# Patient Record
Sex: Female | Born: 1995
Health system: Southern US, Community
[De-identification: ages and names within clinical notes are randomized; demographics above are authoritative.]

## PROBLEM LIST (undated history)

## (undated) DIAGNOSIS — I10 Essential (primary) hypertension: Secondary | ICD-10-CM

## (undated) DIAGNOSIS — F32A Depression, unspecified: Secondary | ICD-10-CM

## (undated) DIAGNOSIS — Z789 Other specified health status: Secondary | ICD-10-CM

## (undated) DIAGNOSIS — A599 Trichomoniasis, unspecified: Secondary | ICD-10-CM

## (undated) DIAGNOSIS — F329 Major depressive disorder, single episode, unspecified: Secondary | ICD-10-CM

## (undated) HISTORY — PX: WISDOM TOOTH EXTRACTION: SHX21

## (undated) HISTORY — DX: Essential (primary) hypertension: I10

## (undated) HISTORY — DX: Depression, unspecified: F32.A

## (undated) HISTORY — DX: Trichomoniasis, unspecified: A59.9

---
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
## 2015-10-20 ENCOUNTER — Emergency Department (HOSPITAL_COMMUNITY)
Admission: EM | Admit: 2015-10-20 | Discharge: 2015-10-21 | Disposition: A | Discharge status: Home / Self Care | Payer: BLUE CROSS/BLUE SHIELD | Attending: Emergency Medicine | Admitting: Emergency Medicine

## 2015-10-20 ENCOUNTER — Encounter (HOSPITAL_COMMUNITY): Payer: Self-pay | Admitting: *Deleted

## 2015-10-20 DIAGNOSIS — R21 Rash and other nonspecific skin eruption: Secondary | ICD-10-CM | POA: Diagnosis present

## 2015-10-20 DIAGNOSIS — Y9289 Other specified places as the place of occurrence of the external cause: Secondary | ICD-10-CM | POA: Diagnosis not present

## 2015-10-20 DIAGNOSIS — W57XXXA Bitten or stung by nonvenomous insect and other nonvenomous arthropods, initial encounter: Secondary | ICD-10-CM | POA: Insufficient documentation

## 2015-10-20 DIAGNOSIS — Y998 Other external cause status: Secondary | ICD-10-CM | POA: Insufficient documentation

## 2015-10-20 DIAGNOSIS — Y9389 Activity, other specified: Secondary | ICD-10-CM | POA: Insufficient documentation

## 2015-10-20 DIAGNOSIS — T148 Other injury of unspecified body region: Secondary | ICD-10-CM | POA: Insufficient documentation

## 2015-10-20 NOTE — ED Notes (Signed)
Pt states she has bug bit all over her body. Pt has a bug in bag she found on her leg.

## 2015-10-21 MED ORDER — TRIAMCINOLONE ACETONIDE 0.1 % EX CREA: 1.0000 "application " | TOPICAL_CREAM | Freq: Two times a day (BID) | CUTANEOUS | Status: DC

## 2015-10-21 NOTE — ED Notes (Signed)
Discharge instructions given, pt demonstrated teach back and verbal understanding. No concerns voiced.  

## 2015-10-21 NOTE — ED Provider Notes (Signed)
CSN: [id number]     Arrival date & time 10/20/15  2331 History   First MD Initiated Contact with Patient 10/20/15 2359     Chief Complaint  Patient presents with  . Insect Bite     (Consider location/radiation/quality/duration/timing/severity/associated sxs/prior Treatment) HPI Comments: Patient presents to the emergency department with a complaint of bites all over her body. She states that she noticed this earlier tonight. She found one of them crawling on her, and brought it with her to the emergency room. She denies any difficulty with swallowing, any difficulty with breathing, any swelling of face. The patient has no known allergies to insects. She has not had any changes in her diet, environment, or medications.  The history is provided by the patient.    History reviewed. No pertinent past medical history. History reviewed. No pertinent past surgical history. No family history on file. Social History  Substance Use Topics  . Smoking status: Never Smoker   . Smokeless tobacco: None  . Alcohol Use: No   OB History    No data available     Review of Systems  Skin: Positive for rash.  All other systems reviewed and are negative.     Allergies  Review of patient's allergies indicates no known allergies.  Home Medications   Prior to Admission medications   Not on File   BP 149/69 mmHg  Pulse 94  Temp(Src) 98.7 F (37.1 C) (Oral)  Resp 18  Ht 5\' 3"  (1.6 m)  Wt 190 lb (86.183 kg)  BMI 33.67 kg/m2  SpO2 100%  LMP 10/01/2015 Physical Exam  Constitutional: She is oriented to person, place, and time. She appears well-developed and well-nourished.  Non-toxic appearance.  HENT:  Head: Normocephalic.  Right Ear: Tympanic membrane and external ear normal.  Left Ear: Tympanic membrane and external ear normal.  Eyes: EOM and lids are normal. Pupils are equal, round, and reactive to light.  Neck: Normal range of motion. Neck supple. Carotid bruit is not present.   Cardiovascular: Normal rate, regular rhythm, normal heart sounds, intact distal pulses and normal pulses.   Pulmonary/Chest: Breath sounds normal. No respiratory distress.  Abdominal: Soft. Bowel sounds are normal. There is no tenderness. There is no guarding.  Musculoskeletal: Normal range of motion.  Lymphadenopathy:       Head (right side): No submandibular adenopathy present.       Head (left side): No submandibular adenopathy present.    She has no cervical adenopathy.  Neurological: She is alert and oriented to person, place, and time. She has normal strength. No cranial nerve deficit or sensory deficit.  Skin: Skin is warm and dry.  There are raised slightly red areas involving the chest, the extremities, and 2 on the leg. No red streaks appreciated.  Psychiatric: She has a normal mood and affect. Her speech is normal.  Nursing note and vitals reviewed.   ED Course  Procedures (including critical care time) Labs Review Labs Reviewed - No data to display  Imaging Review No results found. I have personally reviewed and evaluated these images and lab results as part of my medical decision-making.   EKG Interpretation None      MDM  the vital signs are within normal limits. The pattern of the bites, and the insect that the patient brought with her to the emergency room favor bedbugs. I've instructed the patient on sprained her mattress, Which, and shampooing her carpet daily on the next few days. Patient is given triamcinolone  cream to put on the bite areas. The patient is also using Benadryl for itching.    Final diagnoses:  None    **I have reviewed nursing notes, vital signs, and all appropriate lab and imaging results for this patient.Ivery Quale, PA-C 10/21/15 1610  Devoria Albe, MD 10/21/15 [phone]

## 2016-04-25 ENCOUNTER — Emergency Department (HOSPITAL_COMMUNITY)
Admission: EM | Admit: 2016-04-25 | Discharge: 2016-04-25 | Disposition: A | Discharge status: Home / Self Care | Payer: BLUE CROSS/BLUE SHIELD | Attending: Emergency Medicine | Admitting: Emergency Medicine

## 2016-04-25 ENCOUNTER — Encounter (HOSPITAL_COMMUNITY): Payer: Self-pay | Admitting: Emergency Medicine

## 2016-04-25 DIAGNOSIS — J0110 Acute frontal sinusitis, unspecified: Secondary | ICD-10-CM

## 2016-04-25 DIAGNOSIS — R51 Headache: Secondary | ICD-10-CM

## 2016-04-25 DIAGNOSIS — R519 Headache, unspecified: Secondary | ICD-10-CM

## 2016-04-25 MED ORDER — BUTALBITAL-APAP-CAFF-COD 50-325-40-30 MG PO CAPS: ORAL_CAPSULE | ORAL | Status: DC

## 2016-04-25 MED ORDER — DEXAMETHASONE 4 MG PO TABS: 4.0000 mg | ORAL_TABLET | Freq: Two times a day (BID) | ORAL | Status: DC

## 2016-04-25 MED ORDER — OXYMETAZOLINE HCL 0.05 % NA SOLN
1.0000 | Freq: Once | NASAL | Status: AC
  Administered 2016-04-25: 1 via NASAL
  Filled 2016-04-25: qty 15

## 2016-04-25 MED ORDER — IBUPROFEN 800 MG PO TABS
800.0000 mg | ORAL_TABLET | Freq: Once | ORAL | Status: AC
Start: 2016-04-25 — End: 2016-04-25
  Administered 2016-04-25: 800 mg via ORAL
  Filled 2016-04-25: qty 1

## 2016-04-25 MED ORDER — LORATADINE-PSEUDOEPHEDRINE ER 5-120 MG PO TB12: 1.0000 | ORAL_TABLET | Freq: Two times a day (BID) | ORAL | Status: DC

## 2016-04-25 MED ORDER — DEXAMETHASONE SODIUM PHOSPHATE 4 MG/ML IJ SOLN
8.0000 mg | Freq: Once | INTRAMUSCULAR | Status: AC
  Administered 2016-04-25: 8 mg via INTRAMUSCULAR
  Filled 2016-04-25: qty 2

## 2016-04-25 NOTE — ED Notes (Addendum)
PA at bedside.

## 2016-04-25 NOTE — ED Provider Notes (Signed)
CSN: [id number]     Arrival date & time 04/25/16  1711 History   First MD Initiated Contact with Patient 04/25/16 1721     Chief Complaint  Patient presents with  . Headache     (Consider location/radiation/quality/duration/timing/severity/associated sxs/prior Treatment) HPI Comments: Patient is a 20 year old female who presents to the emergency department with a complaint of left-sided headache.  The patient states she's been having problems with cough and congestion over the last 2 or 3 nights. On last night, she states her headache began and cause difficulty with her concentrating on her job. She did not have any loss of consciousness, but states she is sensitive to light and sound at times. She has not had any double vision on. She's not had any loss of control of upper or lower extremities, or bowel or bladder function. He's not had any previous injury to the head. She has taken Tylenol and some over-the-counter medications without improvement.  The history is provided by the patient.    History reviewed. No pertinent past medical history. History reviewed. No pertinent past surgical history. History reviewed. No pertinent family history. Social History  Substance Use Topics  . Smoking status: Never Smoker   . Smokeless tobacco: None  . Alcohol Use: No   OB History    No data available     Review of Systems  HENT: Positive for congestion, postnasal drip and sinus pressure. Negative for facial swelling, hearing loss, mouth sores, nosebleeds, sore throat and trouble swallowing.   Eyes: Positive for photophobia.  Neurological: Positive for headaches.      Allergies  Review of patient's allergies indicates no known allergies.  Home Medications   Prior to Admission medications   Medication Sig Start Date End Date Taking? Authorizing Provider  triamcinolone cream (KENALOG) 0.1 % Apply 1 application topically 2 (two) times daily. 10/21/15   Ivery Quale, PA-C   BP 147/77  mmHg  Pulse 98  Temp(Src) 99 F (37.2 C) (Oral)  Resp 18  Ht  (1.626 m)  Wt 96.871 kg  BMI 36.64 kg/m2  SpO2 100%  LMP 04/15/2016 Physical Exam  Constitutional: She is oriented to person, place, and time. She appears well-developed and well-nourished.  Non-toxic appearance.  HENT:  Head: Normocephalic.  Right Ear: Tympanic membrane and external ear normal.  Left Ear: Tympanic membrane and external ear normal.  Nasal congestion present. Pain to percussion over the frontal sinuses on the left. There is mild increased redness of the posterior pharynx. The uvula is swollen. The airway is patent.  Eyes: EOM and lids are normal. Pupils are equal, round, and reactive to light.  Neck: Normal range of motion. Neck supple. Carotid bruit is not present.  Cardiovascular: Normal rate, regular rhythm, normal heart sounds, intact distal pulses and normal pulses.   Pulmonary/Chest: Breath sounds normal. No respiratory distress.  Abdominal: Soft. Bowel sounds are normal. There is no tenderness. There is no guarding.  Musculoskeletal: Normal range of motion.  Lymphadenopathy:       Head (right side): No submandibular adenopathy present.       Head (left side): No submandibular adenopathy present.    She has no cervical adenopathy.  Neurological: She is alert and oriented to person, place, and time. She has normal strength. No cranial nerve deficit or sensory deficit.  Skin: Skin is warm and dry.  Psychiatric: She has a normal mood and affect. Her speech is normal.  Nursing note and vitals reviewed.   ED Course  Procedures (including critical care time) Labs Review Labs Reviewed - No data to display  Imaging Review No results found. I have personally reviewed and evaluated these images and lab results as part of my medical decision-making.   EKG Interpretation None      MDM  Vital signs are within normal limits. There is no gross neurologic deficit appreciated on examination. There  is pain to percussion of the frontal sinuses, and nasal congestion present. Suspect the patient has headache related to sinus congestion and upper respiratory infection.  The patient will be treated with Decadron, Fioricet/codeine, and Claritin-D. The patient is to follow-up with her primary physician if not improving.    Final diagnoses:  Acute frontal sinusitis, recurrence not specified  Headache, unspecified headache type    *I have reviewed nursing notes, vital signs, and all appropriate lab and imaging results for this patient.[address]    Yancey Pedley, PA-C 04/25/16 1746  Donnetta HutchingBrian Cook, MD 04/29/16 [phone]

## 2016-04-25 NOTE — Discharge Instructions (Signed)
Sinus Headache A sinus headache occurs when the paranasal sinuses become clogged or swollen. Paranasal sinuses are air pockets within the bones of the face. Sinus headaches can range from mild to severe. CAUSES A sinus headache can result from various conditions that affect the sinuses, such as:  Colds.  Sinus infections.  Allergies. SYMPTOMS The main symptom of this condition is a headache that may feel like pain or pressure in the face, forehead, ears, or upper teeth. People who have a sinus headache often have other symptoms, such as:  Congested or runny nose.  Fever.  Inability to smell. Weather changes can make symptoms worse. DIAGNOSIS This condition may be diagnosed based on:  A physical exam and medical history.  Imaging tests, such as a CT scan and MRI, to check for problems with the sinuses.  A specialist may look into the sinuses with a tool that has a camera (endoscopy). TREATMENT Treatment for this condition depends on the cause.  Sinus pain that is caused by a sinus infection may be treated with antibiotic medicine.  Sinus pain that is caused by allergies may be helped by allergy medicines (antihistamines) and medicated nasal sprays.  Sinus pain that is caused by congestion may be helped by flushing the nose and sinuses with saline solution. HOME CARE INSTRUCTIONS  Take medicines only as directed by your health care provider.  If you were prescribed an antibiotic medicine, finish all of it even if you start to feel better.  If you have congestion, use a nasal spray to help reduce pressure.  If directed, apply a warm, moist washcloth to your face to help relieve pain. SEEK MEDICAL CARE IF:  You have headaches more than one time each week.  You have sensitivity to light or sound.  You have a fever.  You feel sick to your stomach (nauseous) or you throw up (vomit).  Your headaches do not get better with treatment. Many people think that they have a  sinus headache when they actually have migraines or tension headaches. SEEK IMMEDIATE MEDICAL CARE IF:  You have vision problems.  You have sudden, severe pain in your face or head.  You have a seizure.  You are confused.  You have a stiff neck.   This information is not intended to replace advice given to you by your health care provider. Make sure you discuss any questions you have with your health care provider.   Document Released: 01/15/2005 Document Revised: 04/24/2015 Document Reviewed: 12/04/2014 Elsevier Interactive Patient Education 2016 [employer]  

## 2016-04-25 NOTE — ED Notes (Signed)
Pt reports headache and nasal congestion since last night. Pt reports light and sound sensitivity. nad noted.

## 2017-02-03 ENCOUNTER — Emergency Department (HOSPITAL_COMMUNITY): Payer: BLUE CROSS/BLUE SHIELD

## 2017-02-03 ENCOUNTER — Encounter (HOSPITAL_COMMUNITY): Payer: Self-pay | Admitting: Emergency Medicine

## 2017-02-03 ENCOUNTER — Emergency Department (HOSPITAL_COMMUNITY)
Admission: EM | Admit: 2017-02-03 | Discharge: 2017-02-03 | Disposition: A | Discharge status: Home / Self Care | Payer: BLUE CROSS/BLUE SHIELD | Attending: Emergency Medicine | Admitting: Emergency Medicine

## 2017-02-03 DIAGNOSIS — H7092 Unspecified mastoiditis, left ear: Secondary | ICD-10-CM

## 2017-02-03 DIAGNOSIS — J029 Acute pharyngitis, unspecified: Secondary | ICD-10-CM | POA: Diagnosis present

## 2017-02-03 DIAGNOSIS — H66003 Acute suppurative otitis media without spontaneous rupture of ear drum, bilateral: Secondary | ICD-10-CM

## 2017-02-03 LAB — CBC WITH DIFFERENTIAL/PLATELET
BASOS ABS: 0 10*3/uL (ref 0.0–0.1)
BASOS PCT: 0 %
EOS ABS: 0 10*3/uL (ref 0.0–0.7)
EOS PCT: 0 %
HCT: 36.7 % (ref 36.0–46.0)
HEMOGLOBIN: 12.3 g/dL (ref 12.0–15.0)
Lymphocytes Relative: 21 %
Lymphs Abs: 2.6 10*3/uL (ref 0.7–4.0)
MCH: 29.5 pg (ref 26.0–34.0)
MCHC: 33.5 g/dL (ref 30.0–36.0)
MCV: 88 fL (ref 78.0–100.0)
MONO ABS: 0.7 10*3/uL (ref 0.1–1.0)
MONOS PCT: 5 %
NEUTROS ABS: 9.7 10*3/uL — AB (ref 1.7–7.7)
Neutrophils Relative %: 74 %
PLATELETS: 294 10*3/uL (ref 150–400)
RBC: 4.17 MIL/uL (ref 3.87–5.11)
RDW: 12.6 % (ref 11.5–15.5)
WBC: 13 10*3/uL — ABNORMAL HIGH (ref 4.0–10.5)

## 2017-02-03 LAB — BASIC METABOLIC PANEL
Anion gap: 7 (ref 5–15)
BUN: 7 mg/dL (ref 6–20)
CALCIUM: 8.9 mg/dL (ref 8.9–10.3)
CHLORIDE: 107 mmol/L (ref 101–111)
CO2: 25 mmol/L (ref 22–32)
CREATININE: 0.52 mg/dL (ref 0.44–1.00)
Glucose, Bld: 119 mg/dL — ABNORMAL HIGH (ref 65–99)
Potassium: 3.7 mmol/L (ref 3.5–5.1)
SODIUM: 139 mmol/L (ref 135–145)

## 2017-02-03 MED ORDER — IOPAMIDOL (ISOVUE-300) INJECTION 61%
75.0000 mL | Freq: Once | INTRAVENOUS | Status: AC | PRN
  Administered 2017-02-03: 75 mL via INTRAVENOUS

## 2017-02-03 MED ORDER — OXYCODONE-ACETAMINOPHEN 5-325 MG PO TABS
2.0000 | ORAL_TABLET | Freq: Once | ORAL | Status: AC
  Administered 2017-02-03: 2 via ORAL
  Filled 2017-02-03: qty 2

## 2017-02-03 MED ORDER — OXYCODONE-ACETAMINOPHEN 5-325 MG PO TABS: 2.0000 | ORAL_TABLET | ORAL | 0 refills | Status: DC | PRN

## 2017-02-03 MED ORDER — ACETAMINOPHEN 325 MG PO TABS
650.0000 mg | ORAL_TABLET | Freq: Once | ORAL | Status: AC
  Administered 2017-02-03: 650 mg via ORAL
  Filled 2017-02-03: qty 2

## 2017-02-03 MED ORDER — AMOXICILLIN-POT CLAVULANATE 875-125 MG PO TABS: 1.0000 | ORAL_TABLET | Freq: Two times a day (BID) | ORAL | 0 refills | Status: AC

## 2017-02-03 MED ORDER — SODIUM CHLORIDE 0.9 % IV SOLN
3.0000 g | Freq: Once | INTRAVENOUS | Status: AC
  Administered 2017-02-03: 3 g via INTRAVENOUS
  Filled 2017-02-03: qty 3

## 2017-02-03 NOTE — ED Triage Notes (Signed)
Pt reports left ear pain x2 days with fever.  Pt alert and oriented.

## 2017-02-03 NOTE — Discharge Instructions (Signed)
The CT scan showed bilateral middle ear infections worst on the left side and mastoiditis on the left side. Mastoiditis is an infection of the mastoid cavity which is located behind your ear.  You were given IV antibiotics in the emergency department to help with the infection.   Please take the antibiotic prescribed (Augmentin) twice a day for the next 10 days, make sure you do not miss a dose or stop before completing the entire course. You have been prescribed Percocet for severe pain. Please take ibuprofen 600 mg every 8 hours for inflammation and additional pain control. Please follow-up with ear, nose, throat doctor (Dr. Annalee GentaShoemaker) in 2 weeks for reevaluation and to ensure that your ear infections and mastoiditis have resolved.  Your ear pain and fever should start to decrease in the next 24-48 hours, please return to the emergency department immediately if your symptoms are worsening.

## 2017-02-03 NOTE — ED Provider Notes (Signed)
[facility]-EMERGENCY DEPT Provider Note   CSN: [id number] Arrival date & time: 02/03/17  1721     History   Chief Complaint Chief Complaint  Patient presents with  . Otalgia    HPI Alicia Adkins is a 21 y.o. female with no pertinent past medical history presents with nasal congestion, mild sore throat, cough and left-sided ear pain 3 days. Patient developed fever this morning ~51F. Patient denies tinnitus, ear fullness, decreased hearing, dizziness, body aches. No abdominal pain, n/v.   HPI  History reviewed. No pertinent past medical history.  There are no active problems to display for this patient.   History reviewed. No pertinent surgical history.  OB History    No data available       Home Medications    Prior to Admission medications   Medication Sig Start Date End Date Taking? Authorizing Provider  amoxicillin-clavulanate (AUGMENTIN) 875-125 MG tablet Take 1 tablet by mouth every 12 (twelve) hours. 02/03/17 02/13/17  Liberty Handy, PA-C  butalbital-acetaminophen-caffeine (FIORICET/CODEINE) [phone] MG capsule 1 or 2 po q6h prn headache 04/25/16   Ivery Quale, PA-C  dexamethasone (DECADRON) 4 MG tablet Take 1 tablet (4 mg total) by mouth 2 (two) times daily with a meal. 04/25/16   Ivery Quale, PA-C  loratadine-pseudoephedrine (CLARITIN-D 12 HOUR) 5-120 MG tablet Take 1 tablet by mouth 2 (two) times daily. 04/25/16   Ivery Quale, PA-C  oxyCODONE-acetaminophen (PERCOCET/ROXICET) 5-325 MG tablet Take 2 tablets by mouth every 4 (four) hours as needed for severe pain. 02/03/17   Liberty Handy, PA-C  triamcinolone cream (KENALOG) 0.1 % Apply 1 application topically 2 (two) times daily. 10/21/15   Ivery Quale, PA-C    Family History History reviewed. No pertinent family history.  Social History Social History  Substance Use Topics  . Smoking status: Never Smoker  . Smokeless tobacco: Not on file  . Alcohol use No     Allergies   Patient has no  known allergies.   Review of Systems Review of Systems  Constitutional: Positive for fever.  HENT: Positive for congestion, ear pain and sore throat. Negative for ear discharge, hearing loss and postnasal drip.   Eyes: Negative for visual disturbance.  Respiratory: Positive for cough. Negative for shortness of breath.   Cardiovascular: Negative for chest pain.  Gastrointestinal: Negative for abdominal pain, diarrhea, nausea and vomiting.  Genitourinary: Negative for difficulty urinating.  Musculoskeletal: Negative for arthralgias.  Neurological: Negative for dizziness, weakness, light-headedness and headaches.     Physical Exam Updated Vital Signs BP 134/96 (BP Location: Left Arm)   Pulse 107   Temp 100.8 F (38.2 C) (Oral)   Resp 20   Ht 5\' 4"  (1.626 m)   Wt 97.5 kg   LMP 01/31/2017 (Exact Date)   SpO2 100%   BMI 36.90 kg/m   Physical Exam  Constitutional: She is oriented to person, place, and time. She appears well-developed and well-nourished. No distress.  NAD.  HENT:  Head: Normocephalic and atraumatic.  Right Ear: External ear normal.  Left Ear: External ear normal.  Nose: Nose normal.  Mouth/Throat: Oropharynx is clear and moist. No oropharyngeal exudate.  Head: No lesions on scalp. Skull and facial bones symmetric, non-tender without bony abnormalities. Frontal and maxillary sinuses are non-tender to percussion. Eyes: Lids symmetrical without lag or palpable mass. Sclera white without prominent vessels. Conjunctiva pink. PERRL bilaterally.   Ears: Both external ears withut lesions, swelling, deformities or tenderness with manipulation.  RIGHT external ear auditory canal clear  with mild edema and erythema.  LEFT external ear auditory canal edematous and erythematous.  RIGHT TM pearly gray with visible cone of light and bony landmarks bilaterally.  LEFT TM bulging, erythematous and cloudy without visible cone of light or bony landmarks bilaterally. +left mastoid  tenderness. No right mastoid tenderness.  Nose: Nares patent without discharge. Nasal mucosa erythematous and mildly edematous.  No sinus tenderness. Septum midline.  Throat: Lips are pink and symmetrical. Dentition normal.  Gingiva, labial and buccal mucosa pink without lesions, tenderness or fluctuance. Oropharynx and tonsils moist with mild erythema, without edema or exudates. Uvula midline. No trismus.   Eyes: Conjunctivae and EOM are normal. Pupils are equal, round, and reactive to light. No scleral icterus.  Neck: Normal range of motion. Neck supple. No JVD present.  Cardiovascular: Normal rate, regular rhythm and normal heart sounds.   No murmur heard. Pulmonary/Chest: Effort normal and breath sounds normal. She has no wheezes.  Abdominal: Soft. There is no tenderness.  Musculoskeletal: Normal range of motion. She exhibits no deformity.  Lymphadenopathy:    She has no cervical adenopathy.  Neurological: She is alert and oriented to person, place, and time.  CN VIII hearing intact to finger rub   Skin: Skin is warm and dry. Capillary refill takes less than 2 seconds.  Psychiatric: She has a normal mood and affect. Her behavior is normal. Judgment and thought content normal.  Nursing note and vitals reviewed.    ED Treatments / Results  Labs (all labs ordered are listed, but only abnormal results are displayed) Labs Reviewed  CBC WITH DIFFERENTIAL/PLATELET - Abnormal; Notable for the following:       Result Value   WBC 13.0 (*)    Neutro Abs 9.7 (*)    All other components within normal limits  BASIC METABOLIC PANEL - Abnormal; Notable for the following:    Glucose, Bld 119 (*)    All other components within normal limits    EKG  EKG Interpretation None       Radiology Ct Temporal Bones W Contrast  Result Date: 02/03/2017 CLINICAL DATA:  Left ear pain for 2 days with fever EXAM: CT TEMPORAL BONES WITH CONTRAST TECHNIQUE: Axial and coronal plane CT imaging of the  petrous temporal bones was performed with thin-collimation image reconstruction after intravenous contrast administration. Multiplanar CT image reconstructions were also generated. CONTRAST:  75mL ISOVUE-300 IOPAMIDOL (ISOVUE-300) INJECTION 61% COMPARISON:  None. FINDINGS: RIGHT: --Pinna and external auditory canal: Normal. --Ossicular chain: The ossicles are encased by low attenuation material. No ossicular erosion or dislocation. --Tympanic membrane: Normal. --Middle ear: There is low attenuation material filling much of the middle ear. --Epitympanum: Low attenuation material extends from the middle ear into the epitympanum. There is no blunting of the scutum. The tegmen tympani remains intact. --Cochlea, vestibule, vestibular aqueduct and semicircular canals: Normal. No evidence of canal dehiscence or otospongiosis. --Internal auditory canal: Normal. No widening of the porus acusticus. --Facial nerve: No focal abnormality along the course of the facial nerve. --Cerebellopontine angle: Normal. --Petrous Apex: Normal. --Mastoids: Normal. --Carotid canal: Normal position. LEFT: --Pinna and external auditory canal: Normal. --Ossicular chain: The ossicles are encased and low attenuation material. No ossicular erosion. --Tympanic membrane: Not clearly visualized. --Middle ear: The middle ear is largely opacified. --Epitympanum: Low attenuation material opacifying the middle ear extends into the epitympanum. Possible mild blunting of the scutum. The tegmen tympani remains intact. --Cochlea, vestibule, vestibular aqueduct and semicircular canals: Normal. No evidence of canal dehiscence or otospongiosis. --  Internal auditory canal: Normal. No widening of the porus acusticus. --Facial nerve: No focal abnormality along the course of the facial nerve. --Cerebellopontine angle: Normal. --Petrous Apex: Normal. --Mastoids: Partial opacification of the mastoid air cells without coalescence. --Carotid canal: Normal position.  OTHER: --Visualized intracranial: Normal. --Visualized paranasal sinuses: There is partial opacification of the ethmoid air cells, bilateral sphenoid and bilateral maxillary sinuses. There is fluid layering within the sphenoid sinuses and the left maxillary sinus. --Nasopharynx: Clear. --Temporomandibular joints: Normal. --Visualized extracranial soft tissues: Normal. IMPRESSION: 1. Left middle ear opacification and partial opacification of the left mastoid air cells without coalescence. In the provided clinical context, this is in keeping with otomastoiditis. 2. Partial opacification of the right middle ear extending into the epitympanum without ossicular erosion. This may indicate otitis media, but they cholesteatoma may have a similar appearance. Electronically Signed   By: Deatra RobinsonKevin  Herman M.D.   On: 02/03/2017 20:41    Procedures Procedures (including critical care time)  Medications Ordered in ED Medications  iopamidol (ISOVUE-300) 61 % injection 75 mL (75 mLs Intravenous Contrast Given 02/03/17 1958)  acetaminophen (TYLENOL) tablet 650 mg (650 mg Oral Given 02/03/17 2104)  Ampicillin-Sulbactam (UNASYN) 3 g in sodium chloride 0.9 % 100 mL IVPB (0 g Intravenous Stopped 02/03/17 2211)  oxyCODONE-acetaminophen (PERCOCET/ROXICET) 5-325 MG per tablet 2 tablet (2 tablets Oral Given 02/03/17 2128)     Initial Impression / Assessment and Plan / ED Course  I have reviewed the triage vital signs and the nursing notes.  Pertinent labs & imaging results that were available during my care of the patient were reviewed by me and considered in my medical decision making (see chart for details).  Clinical Course as of Feb 03 2219  Tue Feb 03, 2017  2045 IMPRESSION: 1. Left middle ear opacification and partial opacification of the left mastoid air cells without coalescence. In the provided clinical context, this is in keeping with otomastoiditis. 2. Partial opacification of the right middle ear extending into  the epitympanum without ossicular erosion. This may indicate otitis media, but they cholesteatoma may have a similar appearance.   [CG]  2046 Leukocytosis WBC: (!) 13.0 [CG]  2046 Borderline febrile Temp: 99.9 F (37.7 C) [CG]  2111 Spoke to ENT (Dr. Annalee GentaShoemaker) who recommend unasyn 3g in ED and discharge with augmentin and pain control and follow up in clinic in 2 weeks for ear check  [CG]  2137 Febrile, will give tylenol  Temp: 100.8 F (38.2 C) [CG]    Clinical Course User Index [CG] Liberty Handylaudia J Vessie Olmsted, PA-C   21 year old female with no pertinent past medical history presents with upper respiratory infection symptoms and left ear pain 3 days. On exam vital signs are remarkable for low-grade fever at 100.8. There is moderate left middle ear canal erythema and edema, tympanic membrane is bulging, erythematous and cloudy without vesicles. Left mastoid tenderness without edema or surrounding erythema. Right middle ear canal is only mildly erythematous with normal tympanic membrane and no mastoid tenderness. Moderate nasal mucosa edema and clear discharge. Lungs clear to auscultation bilaterally. Concerned for bilateral otitis media and mastoid tenderness suggests possible mastoiditis. CT scan revealed left middle ear opacification and partial opaficiation of left mastoid air cells suggestive of left mastoiditis.  CT scan also showed partial opaficification of right middle ear suggestive of right otitis media. Leukocytosis at 13. ENT consulted, spoke to Dr. Annalee GentaShoemaker, who recommended Unasyn in the ED and discharge with Augmentin 10 days, pain control and clinic follow-up in  2 weeks for reevaluation. Antibiotics given in the ED. Strict ED return precautions given. Patient is aware of red flag symptoms to monitor for that would warrant return to ED for re-evaluation, patient verbalized understanding and plans on making an appointment with Dr. Annalee Genta in 2 weeks. Patient discussed with Dr. Eudelia Bunch who  assisted in MDM.   Final Clinical Impressions(s) / ED Diagnoses   Final diagnoses:  Acute suppurative otitis media of both ears without spontaneous rupture of tympanic membranes, recurrence not specified  Mastoiditis of left side    New Prescriptions New Prescriptions   AMOXICILLIN-CLAVULANATE (AUGMENTIN) 875-125 MG TABLET    Take 1 tablet by mouth every 12 (twelve) hours.   OXYCODONE-ACETAMINOPHEN (PERCOCET/ROXICET) 5-325 MG TABLET    Take 2 tablets by mouth every 4 (four) hours as needed for severe pain.     Liberty Handy, PA-C 02/03/17 2220    Nira Conn, MD 02/04/17 [phone]

## 2017-03-29 IMAGING — CT CT TEMPORAL BONES W/ CM
2 of 4 series · 12 of 40 positions shown, 15 images · IV contrast (iopamidol)
Comparison: None.

CLINICAL DATA: Left ear pain for 2 days with fever

EXAM:
CT TEMPORAL BONES WITH CONTRAST
TECHNIQUE: Axial and coronal plane CT imaging of the petrous temporal bones was
performed with thin-collimation image reconstruction after
intravenous contrast administration. Multiplanar CT image
reconstructions were also generated.
CONTRAST:  75mL XNGN47-DQQ IOPAMIDOL (XNGN47-DQQ) INJECTION 61%

[Series 4: coronal bone. · coronal · 0.23mm/px · 3 of 175 slices shown]
[im 44/175  bone]
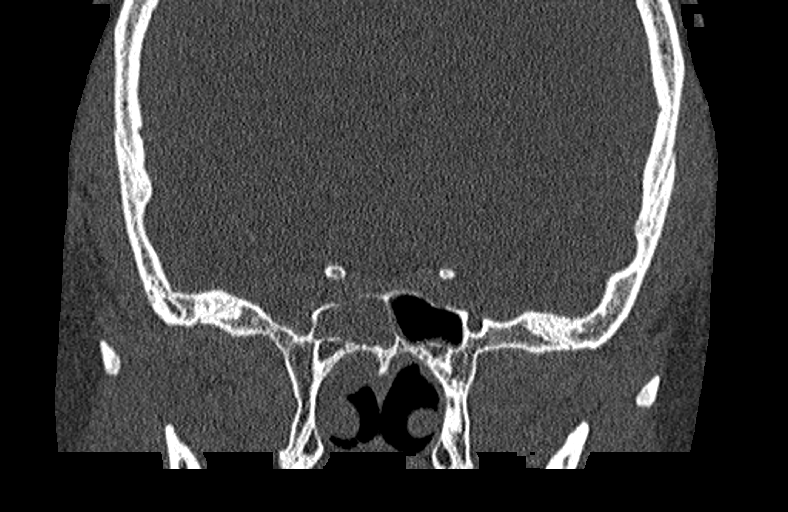
[im 88/175  bone]
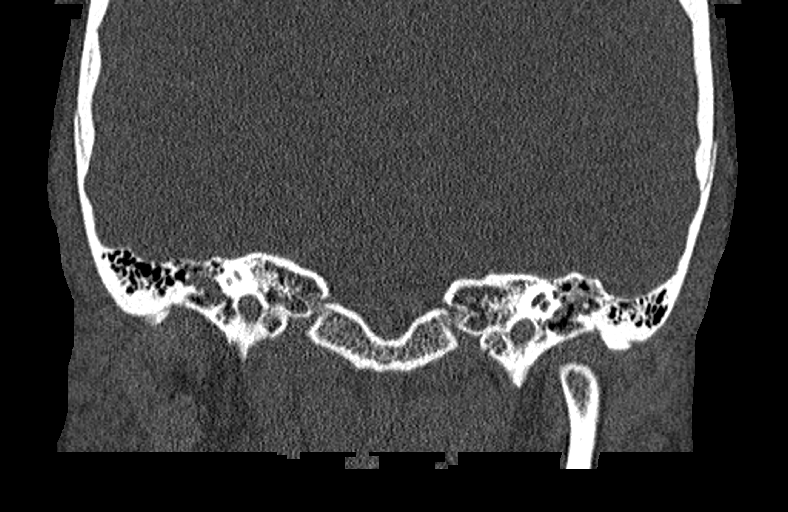
[im 131/175  bone]
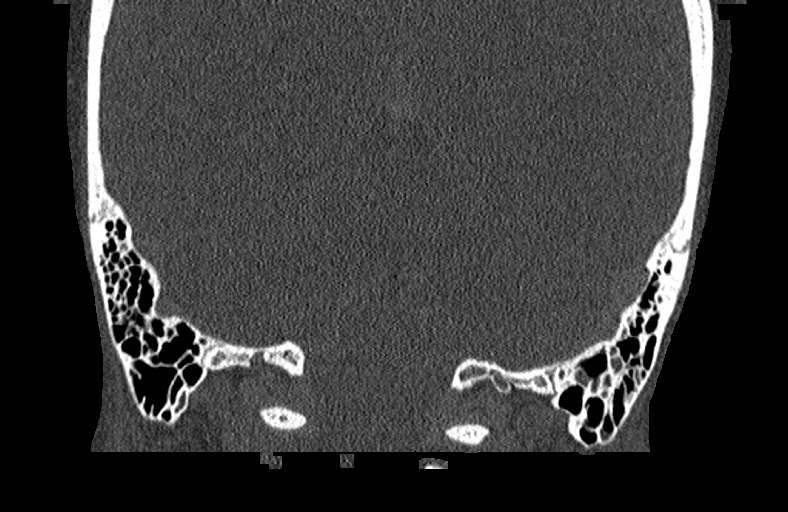

[Series 7: ax mag left · axial · 0.20mm/px · z∈[+1673,+1735]mm · 9 of 130 slices shown, 12 images]
[im 13/130  brain]
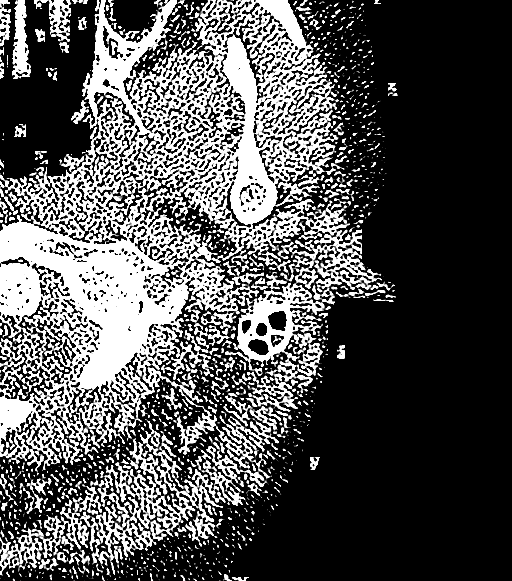
[im 13/130  bone]
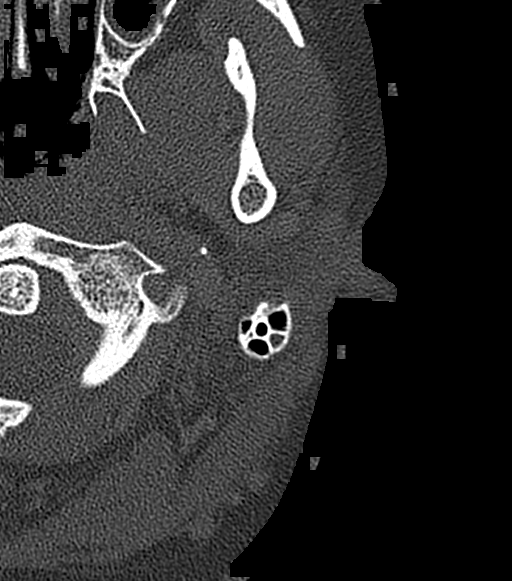
[im 26/130  bone]
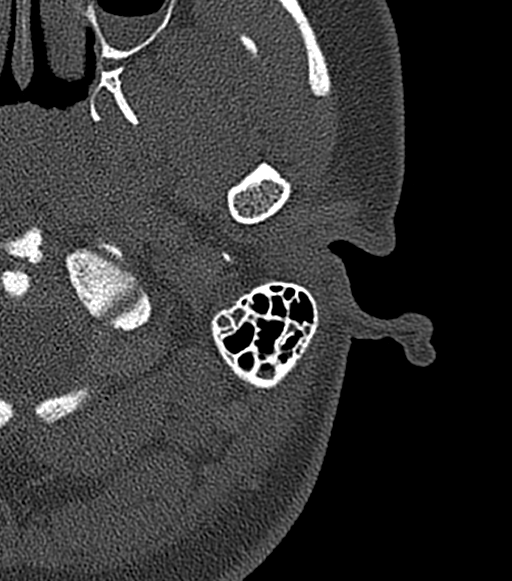
[im 39/130  bone]
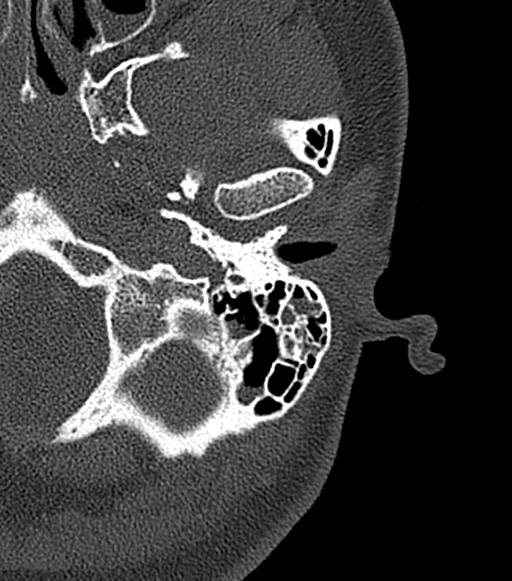
[im 52/130  bone]
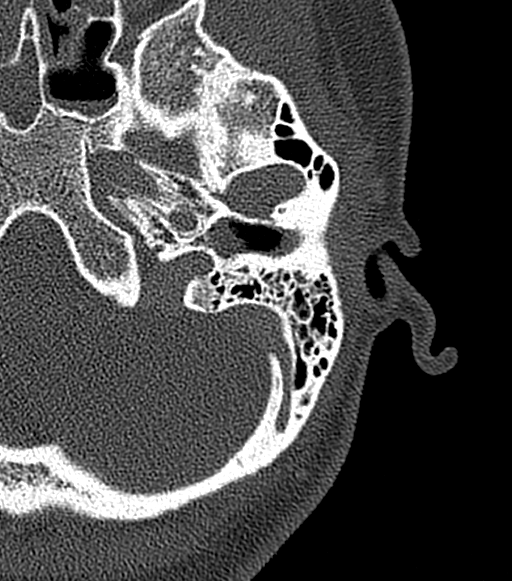
[im 65/130  brain]
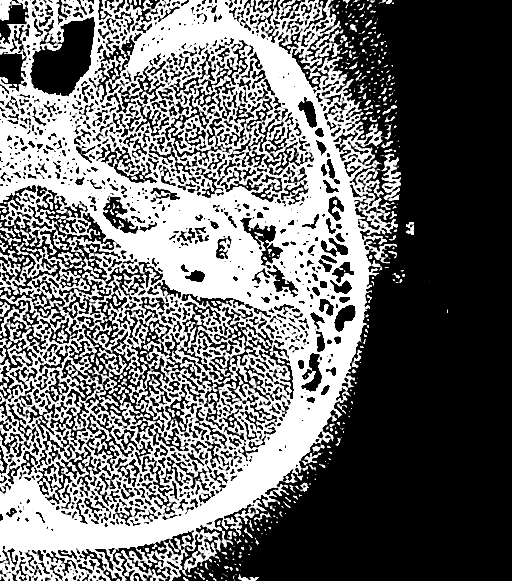
[im 65/130  bone]
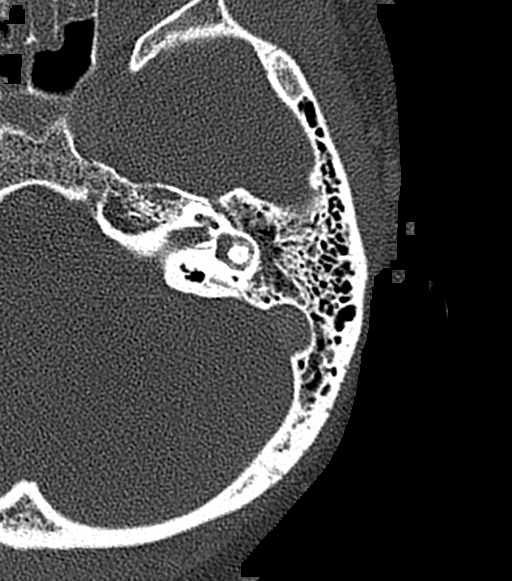
[im 78/130  bone]
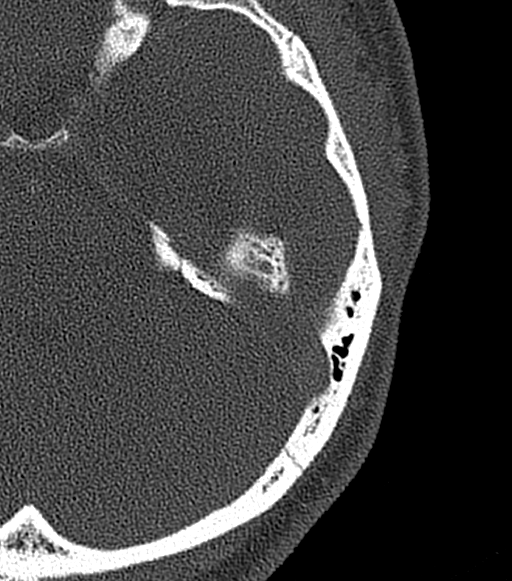
[im 91/130  bone]
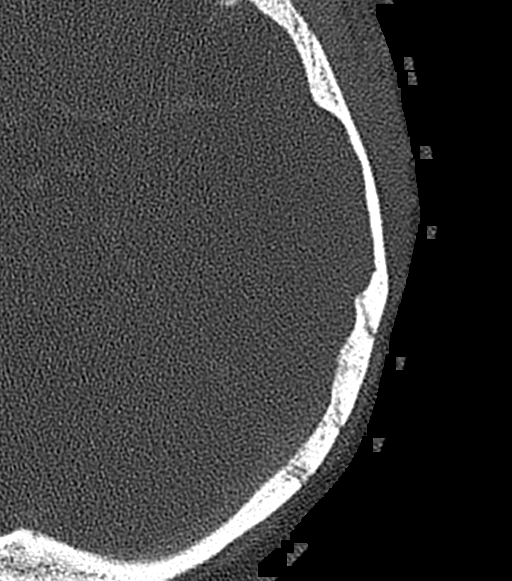
[im 104/130  bone]
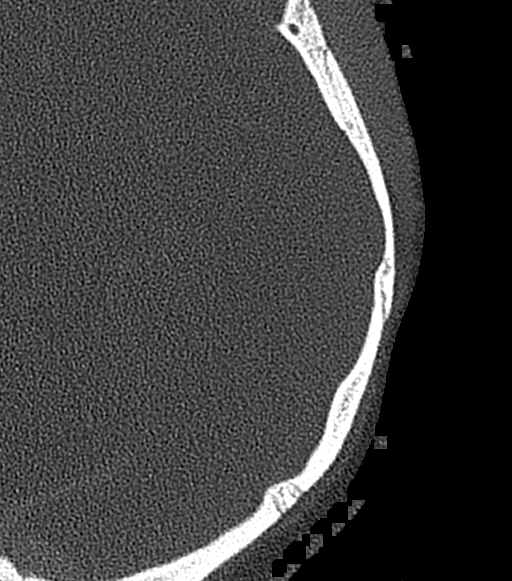
[im 117/130  brain]
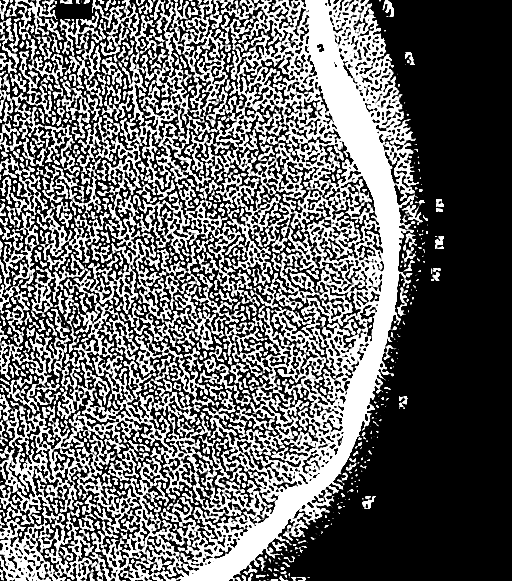
[im 117/130  bone]
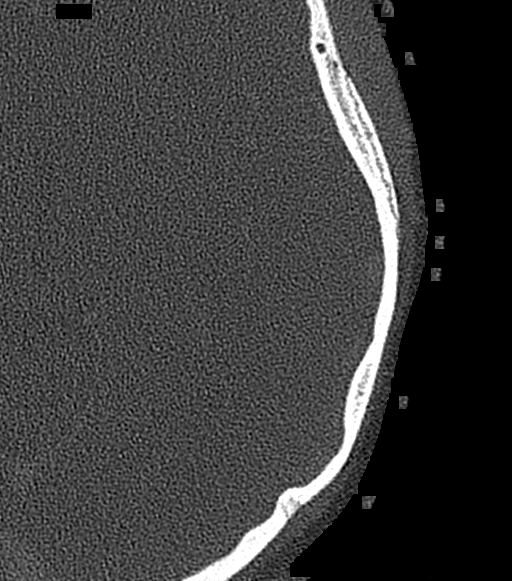

[12 of 40 positions shown; findings below may reference images not displayed]

FINDINGS: RIGHT:

--Pinna and external auditory canal: Normal.

--Ossicular chain: The ossicles are encased by low attenuation
material. No ossicular erosion or dislocation.

--Tympanic membrane: Normal.

--Middle ear: There is low attenuation material filling much of the
middle ear.

--Epitympanum: Low attenuation material extends from the middle ear
into the epitympanum. There is no blunting of the scutum. The tegmen
tympani remains intact.

--Cochlea, vestibule, vestibular aqueduct and semicircular canals:
Normal. No evidence of canal dehiscence or otospongiosis.

--Internal auditory canal: Normal. No widening of the porus
acusticus.

--Facial nerve: No focal abnormality along the course of the facial
nerve.

--Cerebellopontine angle: Normal.

--Petrous Apex: Normal.

--Mastoids: Normal.

--Carotid canal: Normal position.

LEFT:

--Pinna and external auditory canal: Normal.

--Ossicular chain: The ossicles are encased and low attenuation
material. No ossicular erosion.

--Tympanic membrane: Not clearly visualized.

--Middle ear: The middle ear is largely opacified.

--Epitympanum: Low attenuation material opacifying the middle ear
extends into the epitympanum. Possible mild blunting of the scutum.
The tegmen tympani remains intact.

--Cochlea, vestibule, vestibular aqueduct and semicircular canals:
Normal. No evidence of canal dehiscence or otospongiosis.

--Internal auditory canal: Normal. No widening of the porus
acusticus.

--Facial nerve: No focal abnormality along the course of the facial
nerve.

--Cerebellopontine angle: Normal.

--Petrous Apex: Normal.

--Mastoids: Partial opacification of the mastoid air cells without
coalescence.

--Carotid canal: Normal position.

OTHER:

--Visualized intracranial: Normal.

--Visualized paranasal sinuses: There is partial opacification of
the ethmoid air cells, bilateral sphenoid and bilateral maxillary
sinuses. There is fluid layering within the sphenoid sinuses and the
left maxillary sinus.

--Nasopharynx: Clear.

--Temporomandibular joints: Normal.

--Visualized extracranial soft tissues: Normal.
IMPRESSION: 1. Left middle ear opacification and partial opacification of the
left mastoid air cells without coalescence. In the provided clinical
context, this is in keeping with otomastoiditis.
2. Partial opacification of the right middle ear extending into the
epitympanum without ossicular erosion. This may indicate otitis
media, but they cholesteatoma may have a similar appearance.

## 2018-06-09 ENCOUNTER — Encounter (HOSPITAL_COMMUNITY): Payer: Self-pay | Admitting: Emergency Medicine

## 2018-06-09 ENCOUNTER — Other Ambulatory Visit: Payer: Self-pay

## 2018-06-09 ENCOUNTER — Emergency Department (HOSPITAL_COMMUNITY)
Admission: EM | Admit: 2018-06-09 | Discharge: 2018-06-09 | Disposition: A | Discharge status: Home / Self Care | Payer: BLUE CROSS/BLUE SHIELD | Attending: Emergency Medicine | Admitting: Emergency Medicine

## 2018-06-09 DIAGNOSIS — J029 Acute pharyngitis, unspecified: Secondary | ICD-10-CM | POA: Diagnosis present

## 2018-06-09 DIAGNOSIS — J020 Streptococcal pharyngitis: Secondary | ICD-10-CM | POA: Insufficient documentation

## 2018-06-09 LAB — GROUP A STREP BY PCR: Group A Strep by PCR: DETECTED — AB

## 2018-06-09 MED ORDER — PENICILLIN V POTASSIUM 500 MG PO TABS: 500.0000 mg | ORAL_TABLET | Freq: Four times a day (QID) | ORAL | 0 refills | Status: AC

## 2018-06-09 MED ORDER — PENICILLIN V POTASSIUM 250 MG PO TABS
500.0000 mg | ORAL_TABLET | Freq: Once | ORAL | Status: AC
  Administered 2018-06-09: 500 mg via ORAL
  Filled 2018-06-09: qty 2

## 2018-06-09 NOTE — Discharge Instructions (Addendum)
Drink plenty of fluids take Tylenol or Motrin for pain.  Follow-up next week if not improving

## 2018-06-09 NOTE — ED Notes (Signed)
Patient given discharge instruction, verbalized understand. Patient ambulatory out of the department.  

## 2018-06-09 NOTE — ED Provider Notes (Signed)
[facility] EMERGENCY DEPARTMENT Provider Note   CSN: [id number] Arrival date & time: 06/09/18  [id number]     History   Chief Complaint Chief Complaint  Patient presents with  . Sore Throat    HPI Alicia Adkins is a 22 y.o. female.  Patient complains of sore throat and congestion for a few days.  The history is provided by the patient.  Sore Throat  This is a new problem. The current episode started 2 days ago. The problem occurs constantly. The problem has not changed since onset.Pertinent negatives include no chest pain, no abdominal pain and no headaches. Nothing aggravates the symptoms. She has tried nothing for the symptoms. The treatment provided no relief.    History reviewed. No pertinent past medical history.  There are no active problems to display for this patient.   History reviewed. No pertinent surgical history.   OB History   None      Home Medications    Prior to Admission medications   Medication Sig Start Date End Date Taking? Authorizing Provider  butalbital-acetaminophen-caffeine (FIORICET/CODEINE) 50-325-40-30 MG capsule 1 or 2 po q6h prn headache 04/25/16   Ivery QualeBryant, Hobson, PA-C  dexamethasone (DECADRON) 4 MG tablet Take 1 tablet (4 mg total) by mouth 2 (two) times daily with a meal. 04/25/16   Ivery QualeBryant, Hobson, PA-C  loratadine-pseudoephedrine (CLARITIN-D 12 HOUR) 5-120 MG tablet Take 1 tablet by mouth 2 (two) times daily. 04/25/16   Ivery QualeBryant, Hobson, PA-C  oxyCODONE-acetaminophen (PERCOCET/ROXICET) 5-325 MG tablet Take 2 tablets by mouth every 4 (four) hours as needed for severe pain. 02/03/17   Liberty HandyGibbons, Claudia J, PA-C  penicillin v potassium (VEETID) 500 MG tablet Take 1 tablet (500 mg total) by mouth 4 (four) times daily for 7 days. 06/09/18 06/16/18  Bethann BerkshireZammit, Adren Dollins, MD  triamcinolone cream (KENALOG) 0.1 % Apply 1 application topically 2 (two) times daily. 10/21/15   Ivery QualeBryant, Hobson, PA-C    Family History No family history on file.  Social  History Social History   Tobacco Use  . Smoking status: Never Smoker  . Smokeless tobacco: Never Used  Substance Use Topics  . Alcohol use: Yes    Comment: socially   . Drug use: No     Allergies   Patient has no known allergies.   Review of Systems Review of Systems  Constitutional: Negative for appetite change and fatigue.  HENT: Negative for congestion, ear discharge and sinus pressure.        Pharynx inflamed  Eyes: Negative for discharge.  Respiratory: Negative for cough.   Cardiovascular: Negative for chest pain.  Gastrointestinal: Negative for abdominal pain and diarrhea.  Genitourinary: Negative for frequency and hematuria.  Musculoskeletal: Negative for back pain.  Skin: Negative for rash.  Neurological: Negative for seizures and headaches.  Psychiatric/Behavioral: Negative for hallucinations.     Physical Exam Updated Vital Signs BP (!) 137/107 (BP Location: Left Arm)   Pulse 98   Temp 98.2 F (36.8 C) (Oral)   Resp 18   Ht 5\' 4"  (1.626 m)   Wt 104.3 kg (230 lb)   LMP 05/16/2018 (Approximate)   SpO2 98%   BMI 39.48 kg/m   Physical Exam  Constitutional: She is oriented to person, place, and time. She appears well-developed.  HENT:  Head: Normocephalic.  Tonsils enlarged and inflamed.  No exudate  Eyes: Conjunctivae are normal.  Neck: No tracheal deviation present.  Cardiovascular: Normal rate and regular rhythm.  No murmur heard. Pulmonary/Chest:  Lungs clear  Musculoskeletal:  Normal range of motion.  Neurological: She is oriented to person, place, and time.  Skin: Skin is warm.  Psychiatric: She has a normal mood and affect.     ED Treatments / Results  Labs (all labs ordered are listed, but only abnormal results are displayed) Labs Reviewed  GROUP A STREP BY PCR - Abnormal; Notable for the following components:      Result Value   Group A Strep by PCR DETECTED (*)    All other components within normal limits     EKG None  Radiology No results found.  Procedures Procedures (including critical care time)  Medications Ordered in ED Medications  penicillin v potassium (VEETID) tablet 500 mg (has no administration in time range)     Initial Impression / Assessment and Plan / ED Course  I have reviewed the triage vital signs and the nursing notes.  Pertinent labs & imaging results that were available during my care of the patient were reviewed by me and considered in my medical decision making (see chart for details).     Strep test positive.  Patient will be treated with penicillin will follow-up as needed  Final Clinical Impressions(s) / ED Diagnoses   Final diagnoses:  Strep pharyngitis    ED Discharge Orders        Ordered    penicillin v potassium (VEETID) 500 MG tablet  4 times daily     06/09/18 0904       Bethann Berkshire, MD 06/09/18 [phone]

## 2018-06-09 NOTE — ED Triage Notes (Signed)
Pt reports sore throat and otalgia since Sunday. Denies fever.

## 2018-06-28 ENCOUNTER — Other Ambulatory Visit: Payer: Self-pay | Admitting: Adult Health

## 2018-08-04 ENCOUNTER — Ambulatory Visit (INDEPENDENT_AMBULATORY_CARE_PROVIDER_SITE_OTHER): Payer: BLUE CROSS/BLUE SHIELD | Admitting: Adult Health

## 2018-08-04 ENCOUNTER — Other Ambulatory Visit (HOSPITAL_COMMUNITY)
Admission: RE | Admit: 2018-08-04 | Discharge: 2018-08-04 | Disposition: A | Discharge status: Home / Self Care | Payer: BLUE CROSS/BLUE SHIELD | Source: Ambulatory Visit | Attending: Adult Health | Admitting: Adult Health

## 2018-08-04 ENCOUNTER — Encounter: Payer: Self-pay | Admitting: Adult Health

## 2018-08-04 VITALS — BP 138/80 | HR 101 | Ht 63.5 in | Wt 232.0 lb

## 2018-08-04 DIAGNOSIS — F32A Depression, unspecified: Secondary | ICD-10-CM | POA: Insufficient documentation

## 2018-08-04 DIAGNOSIS — Z113 Encounter for screening for infections with a predominantly sexual mode of transmission: Secondary | ICD-10-CM

## 2018-08-04 DIAGNOSIS — Z30011 Encounter for initial prescription of contraceptive pills: Secondary | ICD-10-CM

## 2018-08-04 DIAGNOSIS — Z01419 Encounter for gynecological examination (general) (routine) without abnormal findings: Secondary | ICD-10-CM | POA: Insufficient documentation

## 2018-08-04 DIAGNOSIS — Z3202 Encounter for pregnancy test, result negative: Secondary | ICD-10-CM | POA: Diagnosis not present

## 2018-08-04 DIAGNOSIS — Z3009 Encounter for other general counseling and advice on contraception: Secondary | ICD-10-CM

## 2018-08-04 DIAGNOSIS — F329 Major depressive disorder, single episode, unspecified: Secondary | ICD-10-CM | POA: Insufficient documentation

## 2018-08-04 LAB — POCT URINE PREGNANCY: Preg Test, Ur: NEGATIVE

## 2018-08-04 MED ORDER — NORETHIN-ETH ESTRAD-FE BIPHAS 1 MG-10 MCG / 10 MCG PO TABS: 1.0000 | ORAL_TABLET | Freq: Every day | ORAL | 11 refills | Status: DC

## 2018-08-04 NOTE — Progress Notes (Signed)
Patient ID: Alicia Adkins, female   DOB: Dec 07, 1996, 22 y.o.   MRN: [id number] History of Present Illness: Alicia Adkins is a 22 year old black female, single, G0P0, works at [employer], in for well woman gyn and first pap and she wants to get on OCs. She is a new pt.  No PCP.   Current Medications, Allergies, Past Medical History, Past Surgical History, Family History and Social History were reviewed in [facility] electronic medical record.     Review of Systems: Patient denies any headaches, hearing loss, fatigue, blurred vision, shortness of breath, chest pain, abdominal pain, problems with bowel movements, urination, or intercourse. No joint pain or mood swings. Periods are regular.    Physical Exam:BP 138/80 (BP Location: Left Arm, Patient Position: Sitting, Cuff Size: Large)   Pulse (!) 101   Ht 5' 3.5" (1.613 m)   Wt 232 lb (105.2 kg)   LMP 07/12/2018   BMI 40.45 kg/m UPT negative. General:  Well developed, well nourished, no acute distress Skin:  Warm and dry Neck:  Midline trachea, normal thyroid, good ROM, no lymphadenopathy Lungs; Clear to auscultation bilaterally Breast:  No dominant palpable mass, retraction, or nipple discharge,has bilateral nipple rods Cardiovascular: Regular rate and rhythm Abdomen:  Soft, non tender, no hepatosplenomegaly Pelvic:  External genitalia is normal in appearance, no lesions.  The vagina is normal in appearance. Urethra has no lesions or masses. The cervix is nulliparous, pap with GC/CHL performed.  Uterus is felt to be normal size, shape, and contour.  No adnexal masses or tenderness noted.Bladder is non tender, no masses felt. Rectal: Good sphincter tone, no polyps, or hemorrhoids felt.  Hemoccult negative. Extremities/musculoskeletal:  No swelling or varicosities noted, no clubbing or cyanosis Psych:  No mood changes, alert and cooperative,seems happy PHQ 9 score 11, denies being suicidal or homicidal and declines meds. Examination  chaperoned by Malachy MoodJanet Young LPN.  Impression:   ICD-10-CM   1. Encounter for gynecological examination with Papanicolaou smear of cervix Z01.419 Cytology - PAP  2. Family planning Z30.09 Cytology - PAP    HIV antibody    RPR  3. Pregnancy examination or test, negative result Z32.02 POCT urine pregnancy  4. Screening examination for STD (sexually transmitted disease) Z11.3 HIV antibody    RPR  5. Encounter for initial prescription of contraceptive pills Z30.011   6. Depression, unspecified depression type F32.9       Plan: Check HIV and RPR Meds ordered this encounter  Medications  . Norethindrone-Ethinyl Estradiol-Fe Biphas (LO LOESTRIN FE) 1 MG-10 MCG / 10 MCG tablet    Sig: Take 1 tablet by mouth daily. Take 1 daily by mouth    Dispense:  1 Package    Refill:  11    BIN [id number], PCN [id number], GRP [id number],ID [id number]    Order Specific Question:   Supervising Provider    Answer:   Lazaro ArmsEURE, LUTHER H [[id number]]  Use condoms Physical in 1 year Pap in 3 if normal

## 2018-08-05 LAB — CYTOLOGY - PAP
Adequacy: ABSENT
CHLAMYDIA, DNA PROBE: NEGATIVE
DIAGNOSIS: NEGATIVE
Neisseria Gonorrhea: NEGATIVE

## 2018-08-05 LAB — RPR: RPR Ser Ql: NONREACTIVE

## 2018-08-05 LAB — HIV ANTIBODY (ROUTINE TESTING W REFLEX): HIV SCREEN 4TH GENERATION: NONREACTIVE

## 2018-08-06 ENCOUNTER — Encounter: Payer: Self-pay | Admitting: Adult Health

## 2018-08-06 ENCOUNTER — Telehealth: Payer: Self-pay | Admitting: Adult Health

## 2018-08-06 DIAGNOSIS — A599 Trichomoniasis, unspecified: Secondary | ICD-10-CM

## 2018-08-06 HISTORY — DX: Trichomoniasis, unspecified: A59.9

## 2018-08-06 MED ORDER — METRONIDAZOLE 500 MG PO TABS: 500.0000 mg | ORAL_TABLET | Freq: Two times a day (BID) | ORAL | 0 refills | Status: DC

## 2018-08-06 NOTE — Telephone Encounter (Signed)
Left message that pap was negative for malignancy and GC/CHL but +for trich, rx sent for flagyl to [facility] in [place], no sex, partner needs to be treated, and PCO 8/26 at 2 pm, call with any questions

## 2018-08-16 ENCOUNTER — Ambulatory Visit (INDEPENDENT_AMBULATORY_CARE_PROVIDER_SITE_OTHER): Payer: BLUE CROSS/BLUE SHIELD | Admitting: Adult Health

## 2018-08-16 ENCOUNTER — Encounter: Payer: Self-pay | Admitting: Adult Health

## 2018-08-16 ENCOUNTER — Other Ambulatory Visit: Payer: Self-pay

## 2018-08-16 VITALS — BP 146/88 | HR 77 | Ht 63.5 in | Wt 230.0 lb

## 2018-08-16 DIAGNOSIS — Z8619 Personal history of other infectious and parasitic diseases: Secondary | ICD-10-CM | POA: Diagnosis not present

## 2018-08-16 LAB — POCT WET PREP (WET MOUNT): Trichomonas Wet Prep HPF POC: ABSENT

## 2018-08-16 NOTE — Progress Notes (Signed)
  Subjective:     Patient ID: Alicia Adkins, female   DOB: 1996/11/24, 22 y.o.   MRN: [id number]  HPI Alicia Adkins is a 22 year old black female in for proof of cure for +trich on pap, took meds and has not had sex since.   Review of Systems No sex since treatment Reviewed past medical,surgical, social and family history. Reviewed medications and allergies.     Objective:   Physical Exam BP (!) 146/88 (BP Location: Right Arm, Patient Position: Sitting, Cuff Size: Large)   Pulse 77   Ht 5' 3.5" (1.613 m)   Wt 230 lb (104.3 kg)   LMP 08/10/2018   BMI 40.10 kg/m  Skin warm and dry.Pelvic: external genitalia is normal in appearance no lesions, vagina: brown discharge without odor,urethra has no lesions or masses noted, cervix:smooth, uterus: normal size, shape and contour, non tender, no masses felt, adnexa: no masses or tenderness noted. Bladder is non tender and no masses felt. Wet prep: negative for trich    Assessment:     Hx of trich, negative on POC    Plan:     Use condoms  F/U prn

## 2018-10-04 ENCOUNTER — Ambulatory Visit: Payer: BLUE CROSS/BLUE SHIELD | Admitting: Adult Health

## 2018-12-22 NOTE — L&D Delivery Note (Addendum)
OB/[facility]N [facility] Delivery Note  Alicia Adkins is a 23 y.o. G1P0000 s/p VAVD at [redacted]w[redacted]d. She was admitted for IOL for cHTN.   ROM: 19h 36m with clear fluid GBS Status:  --/Positive (10/22 1654) Maximum Maternal Temperature: 98.9 F    Labor Progress: . Patient arrived at 1.5 cm dilation and was induced initially with misprostol and foley balloon. Reached 4cm and augmented with pitocin and AROM. Reached 10cm but had prolonged bradycardia lasting 5 minutes with nadir in 60's which recovered but subsequently had further decelerations with pushing. Found to be at +3-4 station and decision made to proceed with VAVD.  Delivery Date/Time: 11/02/2019 at 1043 Delivery:   Operative Delivery Note Infant was delivered via Vacuum Assisted Vaginal Delivery due to NRFHT.  The patient was examined and found to be Presentation: vertex; Position: Right,, Occiput,, Anterior; Station: +3.  Verbal consent: obtained from patient.  Risks and benefits discussed in detail.  Risks include, but are not limited to the risks of anesthesia, bleeding, infection, damage to maternal tissues, fetal cephalhematoma.  There is also the risk of inability to effect vaginal delivery of the head, or shoulder dystocia that cannot be resolved by established maneuvers, leading to the need for emergency cesarean section.  The Mityvac bell vacuum was positioned over the sagittal suture 3 cm anterior to posterior fontanelle.  Pressure was then increased to 500 mmHg, and the patient was instructed to push.  Pulling was administered along the pelvic curve.  First pull with significant descent and ending with one pop off with last maternal push. There were three subsequent pulls with release of pressure between pulls and delivery of the infant with fourth full. The infant was then delivered atraumatically. Subsequently there was an approximately 20 second shoulder dystocia that was resolved with McRobert's and suprapubic pressure.    Infant initially with poor tone and no spontaneous cry, cord was clamped and cut after approximately 20 seconds, however on arrival to the warmer infant with sponataneous cry and much improved exam. Cord blood drawn. Placenta delivered spontaneously with gentle cord traction. Fundus firm with massage and Pitocin. Labia, perineum, vagina, and cervix inspected with 2nd degree perineal laceration repaired in usual fashion with 3-0 Vicryl rapide.   Due to brisk bleeding after delivery and intermittently poor tone, in addition to usual pitocin patient was also given 1g TXA and 457mcg misoprostol buccal. A sweep of the lower uterine segment also found a small amount of clot as well as some trailing membranes which were removed. Bleeding resolved to normal after these interventions.   Sponge, instrument and needle counts were correct x2.  Placenta: 3v intact, to L&D Complications: none Lacerations: 2nd degree perineal, repaired in usual fashion EBL: 544 cc Analgesia: epidural   Infant: APGAR (1 MIN): 6   APGAR (5 MINS): 8    Weight: 3270 grams  Augustin Coupe, MD/[facility] OB/GYN Fellow, Faculty Practice

## 2019-03-11 ENCOUNTER — Ambulatory Visit: Payer: BLUE CROSS/BLUE SHIELD | Admitting: Adult Health

## 2019-03-11 ENCOUNTER — Other Ambulatory Visit: Payer: Self-pay

## 2019-03-11 ENCOUNTER — Encounter: Payer: Self-pay | Admitting: Adult Health

## 2019-03-11 VITALS — BP 139/78 | HR 105 | Ht 63.5 in | Wt 240.0 lb

## 2019-03-11 DIAGNOSIS — Z3A01 Less than 8 weeks gestation of pregnancy: Secondary | ICD-10-CM | POA: Diagnosis not present

## 2019-03-11 DIAGNOSIS — O3680X0 Pregnancy with inconclusive fetal viability, not applicable or unspecified: Secondary | ICD-10-CM | POA: Insufficient documentation

## 2019-03-11 DIAGNOSIS — Z3201 Encounter for pregnancy test, result positive: Secondary | ICD-10-CM

## 2019-03-11 LAB — POCT URINE PREGNANCY: PREG TEST UR: POSITIVE — AB

## 2019-03-11 MED ORDER — PRENATAL PLUS 27-1 MG PO TABS: 1.0000 | ORAL_TABLET | Freq: Every day | ORAL | 12 refills | Status: DC

## 2019-03-11 NOTE — Progress Notes (Signed)
Patient ID: ADDILYNN Adkins, female   DOB: [birth year], 23 y.o.   MRN: [id number] History of Present Illness:  Alicia Adkins is a 22 year old black female, single in for UPT, has missed a period and had 5+HPTs. She works at [employer].  Current Medications, Allergies, Past Medical History, Past Surgical History, Family History and Social History were reviewed in [employer] record.     Review of Systems: +missed period with 5+HPTs   Physical Exam: BP 139/78 (BP Location: Right Arm, Patient Position: Sitting, Cuff Size: Large)   Pulse (!) 105   Ht 5' 3.5" (1.613 m)   Wt 240 lb (108.9 kg)   LMP 02/01/2019   BMI 41.85 kg/m  UPT +, about 5+3 weeks by LMP with EDD 11/08/2019. General:  Well developed, well nourished, no acute distress Skin:  Warm and dry Neck:  Midline trachea, normal thyroid, good ROM, no lymphadenopathy Lungs; Clear to auscultation bilaterally Cardiovascular: Regular rate and rhythm Abdomen:  Soft, non tender, Psych:  No mood changes, alert and cooperative,seems happy Fall risk is low.  Impression: 1. Positive pregnancy test   2. Less than [redacted] weeks gestation of pregnancy   3. Encounter to determine fetal viability of pregnancy, single or unspecified fetus       Plan: Meds ordered this encounter  Medications  . prenatal vitamin w/FE, FA (PRENATAL 1 + 1) 27-1 MG TABS tablet    Sig: Take 1 tablet by mouth daily at 12 noon.    Dispense:  30 each    Refill:  12    Order Specific Question:   Supervising Provider    Answer:   Lazaro Arms [[id number]]  Return in 2 weeks for dating US/4 weeks new OB Review handouts on First trimester and by [facility]

## 2019-03-11 NOTE — Patient Instructions (Signed)
First Trimester of Pregnancy  The first trimester of pregnancy is from week 1 until the end of week 13 (months 1 through 3). A week after a sperm fertilizes an egg, the egg will implant on the wall of the uterus. This embryo will begin to develop into a baby. Genes from you and your partner will form the baby. The female genes will determine whether the baby will be a boy or a girl. At 6-8 weeks, the eyes and face will be formed, and the heartbeat can be seen on ultrasound. At the end of 12 weeks, all the baby's organs will be formed.  Now that you are pregnant, you will want to do everything you can to have a healthy baby. Two of the most important things are to get good prenatal care and to follow your health care provider's instructions. Prenatal care is all the medical care you receive before the baby's birth. This care will help prevent, find, and treat any problems during the pregnancy and childbirth.  Body changes during your first trimester  Your body goes through many changes during pregnancy. The changes vary from woman to woman.   You may gain or lose a couple of pounds at first.   You may feel sick to your stomach (nauseous) and you may throw up (vomit). If the vomiting is uncontrollable, call your health care provider.   You may tire easily.   You may develop headaches that can be relieved by medicines. All medicines should be approved by your health care provider.   You may urinate more often. Painful urination may mean you have a bladder infection.   You may develop heartburn as a result of your pregnancy.   You may develop constipation because certain hormones are causing the muscles that push stool through your intestines to slow down.   You may develop hemorrhoids or swollen veins (varicose veins).   Your breasts may begin to grow larger and become tender. Your nipples may stick out more, and the tissue that surrounds them (areola) may become darker.   Your gums may bleed and may be  sensitive to brushing and flossing.   Dark spots or blotches (chloasma, mask of pregnancy) may develop on your face. This will likely fade after the baby is born.   Your menstrual periods will stop.   You may have a loss of appetite.   You may develop cravings for certain kinds of food.   You may have changes in your emotions from day to day, such as being excited to be pregnant or being concerned that something may go wrong with the pregnancy and baby.   You may have more vivid and strange dreams.   You may have changes in your hair. These can include thickening of your hair, rapid growth, and changes in texture. Some women also have hair loss during or after pregnancy, or hair that feels dry or thin. Your hair will most likely return to normal after your baby is born.  What to expect at prenatal visits  During a routine prenatal visit:   You will be weighed to make sure you and the baby are growing normally.   Your blood pressure will be taken.   Your abdomen will be measured to track your baby's growth.   The fetal heartbeat will be listened to between weeks 10 and 14 of your pregnancy.   Test results from any previous visits will be discussed.  Your health care provider may ask you:     How you are feeling.   If you are feeling the baby move.   If you have had any abnormal symptoms, such as leaking fluid, bleeding, severe headaches, or abdominal cramping.   If you are using any tobacco products, including cigarettes, chewing tobacco, and electronic cigarettes.   If you have any questions.  Other tests that may be performed during your first trimester include:   Blood tests to find your blood type and to check for the presence of any previous infections. The tests will also be used to check for low iron levels (anemia) and protein on red blood cells (Rh antibodies). Depending on your risk factors, or if you previously had diabetes during pregnancy, you may have tests to check for high blood sugar  that affects pregnant women (gestational diabetes).   Urine tests to check for infections, diabetes, or protein in the urine.   An ultrasound to confirm the proper growth and development of the baby.   Fetal screens for spinal cord problems (spina bifida) and Down syndrome.   HIV (human immunodeficiency virus) testing. Routine prenatal testing includes screening for HIV, unless you choose not to have this test.   You may need other tests to make sure you and the baby are doing well.  Follow these instructions at home:  Medicines   Follow your health care provider's instructions regarding medicine use. Specific medicines may be either safe or unsafe to take during pregnancy.   Take a prenatal vitamin that contains at least 600 micrograms (mcg) of folic acid.   If you develop constipation, try taking a stool softener if your health care provider approves.  Eating and drinking     Eat a balanced diet that includes fresh fruits and vegetables, whole grains, good sources of protein such as meat, eggs, or tofu, and low-fat dairy. Your health care provider will help you determine the amount of weight gain that is right for you.   Avoid raw meat and uncooked cheese. These carry germs that can cause birth defects in the baby.   Eating four or five small meals rather than three large meals a day may help relieve nausea and vomiting. If you start to feel nauseous, eating a few soda crackers can be helpful. Drinking liquids between meals, instead of during meals, also seems to help ease nausea and vomiting.   Limit foods that are high in fat and processed sugars, such as fried and sweet foods.   To prevent constipation:  ? Eat foods that are high in fiber, such as fresh fruits and vegetables, whole grains, and beans.  ? Drink enough fluid to keep your urine clear or pale yellow.  Activity   Exercise only as directed by your health care provider. Most women can continue their usual exercise routine during  pregnancy. Try to exercise for 30 minutes at least 5 days a week. Exercising will help you:  ? Control your weight.  ? Stay in shape.  ? Be prepared for labor and delivery.   Experiencing pain or cramping in the lower abdomen or lower back is a good sign that you should stop exercising. Check with your health care provider before continuing with normal exercises.   Try to avoid standing for long periods of time. Move your legs often if you must stand in one place for a long time.   Avoid heavy lifting.   Wear low-heeled shoes and practice good posture.   You may continue to have sex unless your health care   provider tells you not to.  Relieving pain and discomfort   Wear a good support bra to relieve breast tenderness.   Take warm sitz baths to soothe any pain or discomfort caused by hemorrhoids. Use hemorrhoid cream if your health care provider approves.   Rest with your legs elevated if you have leg cramps or low back pain.   If you develop varicose veins in your legs, wear support hose. Elevate your feet for 15 minutes, 3-4 times a day. Limit salt in your diet.  Prenatal care   Schedule your prenatal visits by the twelfth week of pregnancy. They are usually scheduled monthly at first, then more often in the last 2 months before delivery.   Write down your questions. Take them to your prenatal visits.   Keep all your prenatal visits as told by your health care provider. This is important.  Safety   Wear your seat belt at all times when driving.   Make a list of emergency phone numbers, including numbers for family, friends, the hospital, and police and fire departments.  General instructions   Ask your health care provider for a referral to a local prenatal education class. Begin classes no later than the beginning of month 6 of your pregnancy.   Ask for help if you have counseling or nutritional needs during pregnancy. Your health care provider can offer advice or refer you to specialists for help  with various needs.   Do not use hot tubs, steam rooms, or saunas.   Do not douche or use tampons or scented sanitary pads.   Do not cross your legs for long periods of time.   Avoid cat litter boxes and soil used by cats. These carry germs that can cause birth defects in the baby and possibly loss of the fetus by miscarriage or stillbirth.   Avoid all smoking, herbs, alcohol, and medicines not prescribed by your health care provider. Chemicals in these products affect the formation and growth of the baby.   Do not use any products that contain nicotine or tobacco, such as cigarettes and e-cigarettes. If you need help quitting, ask your health care provider. You may receive counseling support and other resources to help you quit.   Schedule a dentist appointment. At home, brush your teeth with a soft toothbrush and be gentle when you floss.  Contact a health care provider if:   You have dizziness.   You have mild pelvic cramps, pelvic pressure, or nagging pain in the abdominal area.   You have persistent nausea, vomiting, or diarrhea.   You have a bad smelling vaginal discharge.   You have pain when you urinate.   You notice increased swelling in your face, hands, legs, or ankles.   You are exposed to fifth disease or chickenpox.   You are exposed to German measles (rubella) and have never had it.  Get help right away if:   You have a fever.   You are leaking fluid from your vagina.   You have spotting or bleeding from your vagina.   You have severe abdominal cramping or pain.   You have rapid weight gain or loss.   You vomit blood or material that looks like coffee grounds.   You develop a severe headache.   You have shortness of breath.   You have any kind of trauma, such as from a fall or a car accident.  Summary   The first trimester of pregnancy is from week 1 until   the end of week 13 (months 1 through 3).   Your body goes through many changes during pregnancy. The changes vary from  woman to woman.   You will have routine prenatal visits. During those visits, your health care provider will examine you, discuss any test results you may have, and talk with you about how you are feeling.  This information is not intended to replace advice given to you by your health care provider. Make sure you discuss any questions you have with your health care provider.  Document Released: 12/02/2001 Document Revised: 11/19/2016 Document Reviewed: 11/19/2016  Elsevier Interactive Patient Education  2019 [employer]vier Inc[employer]

## 2019-03-24 ENCOUNTER — Telehealth: Payer: Self-pay | Admitting: *Deleted

## 2019-03-24 NOTE — Telephone Encounter (Signed)
Patient informed that we are not allowing visitors or children to come to appointments at this time. Patient denies any contact with anyone suspected or confirmed of having COVID-19. Pt denies fever, cough, sob, muscle pain, diarrhea, rash, vomiting, abdominal pain, red eye, weakness, bruising or bleeding, joint pain or severe headache.  

## 2019-03-25 ENCOUNTER — Ambulatory Visit (INDEPENDENT_AMBULATORY_CARE_PROVIDER_SITE_OTHER): Payer: BLUE CROSS/BLUE SHIELD

## 2019-03-25 ENCOUNTER — Other Ambulatory Visit: Payer: Self-pay

## 2019-03-25 DIAGNOSIS — O3680X0 Pregnancy with inconclusive fetal viability, not applicable or unspecified: Secondary | ICD-10-CM | POA: Diagnosis not present

## 2019-03-25 DIAGNOSIS — Z3A01 Less than 8 weeks gestation of pregnancy: Secondary | ICD-10-CM

## 2019-03-25 NOTE — Progress Notes (Signed)
[place] 7+3 wks,single IUP w/ys,positive fht 144 bpm,normal ovaries bilat,crl 11.6 mm

## 2019-04-29 ENCOUNTER — Other Ambulatory Visit: Payer: BLUE CROSS/BLUE SHIELD

## 2019-04-29 ENCOUNTER — Encounter: Payer: BLUE CROSS/BLUE SHIELD | Admitting: Advanced Practice Midwife

## 2019-04-29 ENCOUNTER — Ambulatory Visit: Payer: BLUE CROSS/BLUE SHIELD | Admitting: *Deleted

## 2019-05-02 ENCOUNTER — Other Ambulatory Visit: Payer: Self-pay | Admitting: Obstetrics and Gynecology

## 2019-05-02 DIAGNOSIS — Z3682 Encounter for antenatal screening for nuchal translucency: Secondary | ICD-10-CM

## 2019-05-04 ENCOUNTER — Ambulatory Visit (INDEPENDENT_AMBULATORY_CARE_PROVIDER_SITE_OTHER): Payer: BLUE CROSS/BLUE SHIELD | Admitting: Women's Health

## 2019-05-04 ENCOUNTER — Other Ambulatory Visit: Payer: Self-pay

## 2019-05-04 ENCOUNTER — Ambulatory Visit (INDEPENDENT_AMBULATORY_CARE_PROVIDER_SITE_OTHER): Payer: BLUE CROSS/BLUE SHIELD

## 2019-05-04 ENCOUNTER — Ambulatory Visit: Payer: BLUE CROSS/BLUE SHIELD | Admitting: *Deleted

## 2019-05-04 ENCOUNTER — Encounter: Payer: Self-pay | Admitting: Women's Health

## 2019-05-04 VITALS — BP 132/82 | HR 88 | Wt 238.0 lb

## 2019-05-04 DIAGNOSIS — Z3401 Encounter for supervision of normal first pregnancy, first trimester: Secondary | ICD-10-CM | POA: Diagnosis not present

## 2019-05-04 DIAGNOSIS — Z1389 Encounter for screening for other disorder: Secondary | ICD-10-CM

## 2019-05-04 DIAGNOSIS — O0990 Supervision of high risk pregnancy, unspecified, unspecified trimester: Secondary | ICD-10-CM | POA: Insufficient documentation

## 2019-05-04 DIAGNOSIS — Z3682 Encounter for antenatal screening for nuchal translucency: Secondary | ICD-10-CM

## 2019-05-04 DIAGNOSIS — Z363 Encounter for antenatal screening for malformations: Secondary | ICD-10-CM

## 2019-05-04 DIAGNOSIS — Z331 Pregnant state, incidental: Secondary | ICD-10-CM

## 2019-05-04 DIAGNOSIS — Z3A13 13 weeks gestation of pregnancy: Secondary | ICD-10-CM

## 2019-05-04 DIAGNOSIS — Z1379 Encounter for other screening for genetic and chromosomal anomalies: Secondary | ICD-10-CM

## 2019-05-04 LAB — POCT URINALYSIS DIPSTICK OB
Blood, UA: NEGATIVE
Glucose, UA: NEGATIVE
Ketones, UA: NEGATIVE
Leukocytes, UA: NEGATIVE
Nitrite, UA: NEGATIVE
POC,PROTEIN,UA: NEGATIVE

## 2019-05-04 MED ORDER — BLOOD PRESSURE MONITOR MISC: 0 refills | Status: DC

## 2019-05-04 NOTE — Progress Notes (Signed)
[place] 13+1 wks,measurements c/w dates,crl 71.52 mm,normal ovaries bilat,fhr 155 bpm,posterior placenta,NB present,NT 2.2 mm

## 2019-05-04 NOTE — Progress Notes (Signed)
TELEHEALTH VIRTUAL INITIAL OBSTETRICAL VISIT ENCOUNTER NOTE Patient name: Alicia Adkins MRN [id number]  Date of birth: 1996/06/30  I connected with patient on 05/04/19 at  4:00 PM EDT by [facility] and verified that I am speaking with the correct person using two identifiers. Due to COVID-19 recommendations, pt is not currently in our office. Here earlier for NT [place].   I discussed the limitations, risks, security and privacy concerns of performing an evaluation and management service by telephone and the availability of in person appointments. I also discussed with the patient that there may be a patient responsible charge related to this service. The patient expressed understanding and agreed to proceed.  Chief Complaint:   Initial Prenatal Visit (nt/it)  History of Present Illness:   Alicia Adkins is a 23 y.o. G38P0000 African American female at [redacted]w[redacted]d by LMP c/w 7wk u/s, with an Estimated Date of Delivery: 11/08/19 being evaluated today for her initial obstetrical visit.   Her obstetrical history is significant for primigravida.   Dep- no meds, doing well, denies SI Today she reports mild nausea, declines nausea meds.  Patient's last menstrual period was 02/01/2019. Last pap 08/04/18. Results were: normal Review of Systems:   Pertinent items are noted in HPI Denies cramping/contractions, leakage of fluid, vaginal bleeding, abnormal vaginal discharge w/ itching/odor/irritation, headaches, visual changes, shortness of breath, chest pain, abdominal pain, severe nausea/vomiting, or problems with urination or bowel movements unless otherwise stated above.  Pertinent History Reviewed:  Reviewed past medical,surgical, social, obstetrical and family history.  Reviewed problem list, medications and allergies. OB History  Gravida Para Term Preterm AB Living  1 0 0 0 0 0  SAB TAB Ectopic Multiple Live Births  0 0 0 0 0    # Outcome Date GA Lbr Len/2nd Weight Sex Delivery Anes PTL Lv  1  Current            Physical Assessment:   Vitals:   05/04/19 0907  BP: 132/82  Pulse: 88  Weight: 238 lb (108 kg)  Body mass index is 41.5 kg/m.       Physical Examination:  General:  Alert, oriented and cooperative.   Mental Status: Normal mood and affect perceived. Normal judgment and thought content.  Rest of physical exam deferred due to type of encounter  Results for orders placed or performed in visit on 05/04/19 (from the past 24 hour(s))  POC Urinalysis Dipstick OB   Collection Time: 05/04/19  9:39 AM  Result Value Ref Range   Color, UA     Clarity, UA     Glucose, UA Negative Negative   Bilirubin, UA     Ketones, UA neg    Spec Grav, UA     Blood, UA neg    pH, UA     POC,PROTEIN,UA Negative Negative, Trace, Small (1+), Moderate (2+), Large (3+), 4+   Urobilinogen, UA     Nitrite, UA neg    Leukocytes, UA Negative Negative   Appearance     Odor     Today's NT [place] 13+1 wks,measurements c/w dates,crl 71.52 mm,normal ovaries bilat,fhr 155 bpm,posterior placenta,NB present,NT 2.2 mm Assessment & Plan:  1) Low-Risk Pregnancy G1P0000 at [redacted]w[redacted]d with an Estimated Date of Delivery: 11/08/19   2) Initial OB visit  3) Depression> no meds, doing well  Meds:  Meds ordered this encounter  Medications  . Blood Pressure Monitor MISC    Sig: For regular home bp monitoring during pregnancy    Dispense:  1 each    Refill:  0    Z34.90    Initial labs obtained Continue prenatal vitamins Reviewed n/v relief measures and warning s/s to report Reviewed recommended weight gain based on pre-gravid BMI Encouraged well-balanced diet Genetic Screening discussed: requested nt/it, declined maternit21 Cystic fibrosis, SMA, Fragile X screening discussed declined Ultrasound discussed; fetal survey: requested CCNC completed Does not have BP cuff>rx sent to [facility] Check BP weekly, let us know if >140/90   I discussed the assessment and treatment plan with the  patient. The patient was provided an opportunity to ask questions and all were answered. The patient agreed with the plan and demonstrated an understanding of the instructions.   The patient was advised to call back or seek an in-person evaluation/go to the ED for any concerning postpartum symptoms.  I provided 15 minutes of non-face-to-face time during this encounter.  Follow-up: Return in about 5 weeks (around 06/08/2019) for LROB, 2nd IT, NW:GNFAOZHS:Anatomy.   Orders Placed This Encounter  Procedures  . GC/Chlamydia Probe Amp  . Urine Culture  . Obstetric Panel, Including HIV  . Urinalysis, Routine w reflex microscopic  . Sickle cell screen  . Integrated 1  . Pain Management Screening Profile (10S)  . POC Urinalysis Dipstick OB    Cheral MarkerKimberly R Infant Zink CNM, [facility] HospitalWHNP-BC 05/04/2019 1:44 PM

## 2019-05-04 NOTE — Patient Instructions (Addendum)
Alicia Adkins, I greatly value your feedback.  If you receive a survey following your visit with [place] today, we appreciate you taking the time to fill it out.  Thanks, [name], CNM, [facility]  [facility] HAS MOVED!!! It is now [facility] ([address] [place], [place] [address]) Entrance located off of [address] 24/7 valet parking      Nausea & Vomiting  Have saltine crackers or pretzels by your bed and eat a few bites before you raise your head out of bed in the morning  Eat small frequent meals throughout the day instead of large meals  Drink plenty of fluids throughout the day to stay hydrated, just don't drink a lot of fluids with your meals.  This can make your stomach fill up faster making you feel sick  Do not brush your teeth right after you eat  Products with real ginger are good for nausea, like ginger ale and ginger hard candy Make sure it says made with real ginger!  Sucking on sour candy like lemon heads is also good for nausea  If your prenatal vitamins make you nauseated, take them at night so you will sleep through the nausea  Sea Bands  If you feel like you need medicine for the nausea & vomiting please let us know  If you are unable to keep any fluids or food down please let us know   Constipation  Drink plenty of fluid, preferably water, throughout the day  Eat foods high in fiber such as fruits, vegetables, and grains  Exercise, such as walking, is a good way to keep your bowels regular  Drink warm fluids, especially warm prune juice, or decaf coffee  Eat a 1/2 cup of real oatmeal (not instant), 1/2 cup applesauce, and 1/2-1 cup warm prune juice every day  If needed, you may take Colace (docusate sodium) stool softener once or twice a day to help keep the stool soft. If you are pregnant, wait until you are out of your first trimester (12-14 weeks of pregnancy)  If you still are having problems with  constipation, you may take Miralax once daily as needed to help keep your bowels regular.  If you are pregnant, wait until you are out of your first trimester (12-14 weeks of pregnancy)  Home Blood Pressure Monitoring for Patients   Your provider has recommended that you check your blood pressure (BP) at least once a week at home. If you do not have a blood pressure cuff at home, one will be provided for you. Contact your provider if you have not received your monitor within 1 week.   Helpful Tips for Accurate Home Blood Pressure Checks  . Don't smoke, exercise, or drink caffeine 30 minutes before checking your BP . Use the restroom before checking your BP (a full bladder can raise your pressure) . Relax in a comfortable upright chair . Feet on the ground . Left arm resting comfortably on a flat surface at the level of your heart . Legs uncrossed . Back supported . Sit quietly and don't talk . Place the cuff on your bare arm . Adjust snuggly, so that only two fingertips can fit between your skin and the top of the cuff . Check 2 readings separated by at least one minute . Keep a log of your BP readings . For a visual, please reference this diagram: http://ccnc.care/bpdiagram  Provider Name: [facility]  Phone: [phone]  Zone 1: ALL CLEAR  Continue to monitor your symptoms:  . BP reading is less than 140 (top number) or less than 90 (bottom number)  . No right upper stomach pain . No headaches or seeing spots . No feeling nauseated or throwing up . No swelling in face and hands  Zone 2: CAUTION Call your doctor's office for any of the following:  . BP reading is greater than 140 (top number) or greater than 90 (bottom number)  . Stomach pain under your ribs in the middle or right side . Headaches or seeing spots . Feeling nauseated or throwing up . Swelling in face and hands  Zone 3: EMERGENCY  Seek immediate medical care if you have any of the following:  . BP  reading is greater than160 (top number) or greater than 110 (bottom number) . Severe headaches not improving with Tylenol . Serious difficulty catching your breath . Any worsening symptoms from Zone 2     First Trimester of Pregnancy The first trimester of pregnancy is from week 1 until the end of week 12 (months 1 through 3). A week after a sperm fertilizes an egg, the egg will implant on the wall of the uterus. This embryo will begin to develop into a baby. Genes from you and your partner are forming the baby. The female genes determine whether the baby is a boy or a girl. At 6-8 weeks, the eyes and face are formed, and the heartbeat can be seen on ultrasound. At the end of 12 weeks, all the baby's organs are formed.  Now that you are pregnant, you will want to do everything you can to have a healthy baby. Two of the most important things are to get good prenatal care and to follow your health care provider's instructions. Prenatal care is all the medical care you receive before the baby's birth. This care will help prevent, find, and treat any problems during the pregnancy and childbirth. BODY CHANGES Your body goes through many changes during pregnancy. The changes vary from woman to woman.   You may gain or lose a couple of pounds at first.  You may feel sick to your stomach (nauseous) and throw up (vomit). If the vomiting is uncontrollable, call your health care provider.  You may tire easily.  You may develop headaches that can be relieved by medicines approved by your health care provider.  You may urinate more often. Painful urination may mean you have a bladder infection.  You may develop heartburn as a result of your pregnancy.  You may develop constipation because certain hormones are causing the muscles that push waste through your intestines to slow down.  You may develop hemorrhoids or swollen, bulging veins (varicose veins).  Your breasts may begin to grow larger and become  tender. Your nipples may stick out more, and the tissue that surrounds them (areola) may become darker.  Your gums may bleed and may be sensitive to brushing and flossing.  Dark spots or blotches (chloasma, mask of pregnancy) may develop on your face. This will likely fade after the baby is born.  Your menstrual periods will stop.  You may have a loss of appetite.  You may develop cravings for certain kinds of food.  You may have changes in your emotions from day to day, such as being excited to be pregnant or being concerned that something may go wrong with the pregnancy and baby.  You may have more vivid and  strange dreams.  You may have changes in your hair. These can include thickening of your hair, rapid growth, and changes in texture. Some women also have hair loss during or after pregnancy, or hair that feels dry or thin. Your hair will most likely return to normal after your baby is born. WHAT TO EXPECT AT YOUR PRENATAL VISITS During a routine prenatal visit:  You will be weighed to make sure you and the baby are growing normally.  Your blood pressure will be taken.  Your abdomen will be measured to track your baby's growth.  The fetal heartbeat will be listened to starting around week 10 or 12 of your pregnancy.  Test results from any previous visits will be discussed. Your health care provider may ask you:  How you are feeling.  If you are feeling the baby move.  If you have had any abnormal symptoms, such as leaking fluid, bleeding, severe headaches, or abdominal cramping.  If you have any questions. Other tests that may be performed during your first trimester include:  Blood tests to find your blood type and to check for the presence of any previous infections. They will also be used to check for low iron levels (anemia) and Rh antibodies. Later in the pregnancy, blood tests for diabetes will be done along with other tests if problems develop.  Urine tests to  check for infections, diabetes, or protein in the urine.  An ultrasound to confirm the proper growth and development of the baby.  An amniocentesis to check for possible genetic problems.  Fetal screens for spina bifida and Down syndrome.  You may need other tests to make sure you and the baby are doing well. HOME CARE INSTRUCTIONS  Medicines  Follow your health care provider's instructions regarding medicine use. Specific medicines may be either safe or unsafe to take during pregnancy.  Take your prenatal vitamins as directed.  If you develop constipation, try taking a stool softener if your health care provider approves. Diet  Eat regular, well-balanced meals. Choose a variety of foods, such as meat or vegetable-based protein, fish, milk and low-fat dairy products, vegetables, fruits, and whole grain breads and cereals. Your health care provider will help you determine the amount of weight gain that is right for you.  Avoid raw meat and uncooked cheese. These carry germs that can cause birth defects in the baby.  Eating four or five small meals rather than three large meals a day may help relieve nausea and vomiting. If you start to feel nauseous, eating a few soda crackers can be helpful. Drinking liquids between meals instead of during meals also seems to help nausea and vomiting.  If you develop constipation, eat more high-fiber foods, such as fresh vegetables or fruit and whole grains. Drink enough fluids to keep your urine clear or pale yellow. Activity and Exercise  Exercise only as directed by your health care provider. Exercising will help you:  Control your weight.  Stay in shape.  Be prepared for labor and delivery.  Experiencing pain or cramping in the lower abdomen or low back is a good sign that you should stop exercising. Check with your health care provider before continuing normal exercises.  Try to avoid standing for long periods of time. Move your legs often  if you must stand in one place for a long time.  Avoid heavy lifting.  Wear low-heeled shoes, and practice good posture.  You may continue to have sex unless your health care provider  directs you otherwise. Relief of Pain or Discomfort  Wear a good support bra for breast tenderness.    Take warm sitz baths to soothe any pain or discomfort caused by hemorrhoids. Use hemorrhoid cream if your health care provider approves.    Rest with your legs elevated if you have leg cramps or low back pain.  If you develop varicose veins in your legs, wear support hose. Elevate your feet for 15 minutes, 3-4 times a day. Limit salt in your diet. Prenatal Care  Schedule your prenatal visits by the twelfth week of pregnancy. They are usually scheduled monthly at first, then more often in the last 2 months before delivery.  Write down your questions. Take them to your prenatal visits.  Keep all your prenatal visits as directed by your health care provider. Safety  Wear your seat belt at all times when driving.  Make a list of emergency phone numbers, including numbers for family, friends, the hospital, and police and fire departments. General Tips  Ask your health care provider for a referral to a local prenatal education class. Begin classes no later than at the beginning of month 6 of your pregnancy.  Ask for help if you have counseling or nutritional needs during pregnancy. Your health care provider can offer advice or refer you to specialists for help with various needs.  Do not use hot tubs, steam rooms, or saunas.  Do not douche or use tampons or scented sanitary pads.  Do not cross your legs for long periods of time.  Avoid cat litter boxes and soil used by cats. These carry germs that can cause birth defects in the baby and possibly loss of the fetus by miscarriage or stillbirth.  Avoid all smoking, herbs, alcohol, and medicines not prescribed by your health care provider. Chemicals in  these affect the formation and growth of the baby.  Schedule a dentist appointment. At home, brush your teeth with a soft toothbrush and be gentle when you floss. SEEK MEDICAL CARE IF:   You have dizziness.  You have mild pelvic cramps, pelvic pressure, or nagging pain in the abdominal area.  You have persistent nausea, vomiting, or diarrhea.  You have a bad smelling vaginal discharge.  You have pain with urination.  You notice increased swelling in your face, hands, legs, or ankles. SEEK IMMEDIATE MEDICAL CARE IF:   You have a fever.  You are leaking fluid from your vagina.  You have spotting or bleeding from your vagina.  You have severe abdominal cramping or pain.  You have rapid weight gain or loss.  You vomit blood or material that looks like coffee grounds.  You are exposed to [place] measles and have never had them.  You are exposed to fifth disease or chickenpox.  You develop a severe headache.  You have shortness of breath.  You have any kind of trauma, such as from a fall or a car accident. Document Released: 12/02/2001 Document Revised: 04/24/2014 Document Reviewed: 10/18/2013 [facility] Patient Information 2015 [place], [place]. This information is not intended to replace advice given to you by your health care provider. Make sure you discuss any questions you have with your health care provider.  Coronavirus (COVID-19) Are you at risk?  Are you at risk for the Coronavirus (COVID-19)?  To be considered HIGH RISK for Coronavirus (COVID-19), you have to meet the following criteria:  . Traveled to [place], [place], [place], [place] or [place]; or in the [place] to [place], [place], or [place]; and have fever, cough, and shortness of breath within the last 2 weeks of travel OR . Been in close contact with a person diagnosed with COVID-19 within the last 2 weeks and have fever, cough, and shortness of breath . IF YOU DO NOT MEET THESE  CRITERIA, YOU ARE CONSIDERED LOW RISK FOR COVID-19.  What to do if you are HIGH RISK for COVID-19?  Marland Kitchen. If you are having a medical emergency, call 911. . Seek medical care right away. Before you go to a doctor's office, urgent care or emergency department, call ahead and tell them about your recent travel, contact with someone diagnosed with COVID-19, and your symptoms. You should receive instructions from your physician's office regarding next steps of care.  . When you arrive at healthcare provider, tell the healthcare staff immediately you have returned from visiting [place], [place], [place], [place] or [place]; or traveled in the [place] to [place], [place], [place], or [place]; in the last two weeks or you have been in close contact with a person diagnosed with COVID-19 in the last 2 weeks.   . Tell the health care staff about your symptoms: fever, cough and shortness of breath. . After you have been seen by a medical provider, you will be either: o Tested for (COVID-19) and discharged home on quarantine except to seek medical care if symptoms worsen, and asked to  - Stay home and avoid contact with others until you get your results (4-5 days)  - Avoid travel on public transportation if possible (such as bus, train, or airplane) or o Sent to the Emergency Department by EMS for evaluation, COVID-19 testing, and possible admission depending on your condition and test results.  What to do if you are LOW RISK for COVID-19?  Reduce your risk of any infection by using the same precautions used for avoiding the common cold or flu:  Marland Kitchen. Wash your hands often with soap and warm water for at least 20 seconds.  If soap and water are not readily available, use an alcohol-based hand sanitizer with at least 60% alcohol.  . If coughing or sneezing, cover your mouth and nose by coughing or sneezing into the elbow areas of your shirt or coat, into a tissue or into your sleeve (not your hands). .  Avoid shaking hands with others and consider head nods or verbal greetings only. . Avoid touching your eyes, nose, or mouth with unwashed hands.  . Avoid close contact with people who are sick. . Avoid places or events with large numbers of people in one location, like concerts or sporting events. . Carefully consider travel plans you have or are making. . If you are planning any travel outside or inside the [place], visit the CDC's Travelers' Health webpage for the latest health notices. . If you have some symptoms but not all symptoms, continue to monitor at home and seek medical attention if your symptoms worsen. . If you are having a medical emergency, call 911.   ADDITIONAL HEALTHCARE OPTIONS FOR PATIENTS  [facility] / e-Visit: [email or web address]         [facility]: [phone]  [facility]: [phone]                   [facility]: [phone]

## 2019-05-05 LAB — PMP SCREEN PROFILE (10S), URINE
Amphetamine Scrn, Ur: NEGATIVE ng/mL
BARBITURATE SCREEN URINE: NEGATIVE ng/mL
BENZODIAZEPINE SCREEN, URINE: NEGATIVE ng/mL
CANNABINOIDS UR QL SCN: NEGATIVE ng/mL
Cocaine (Metab) Scrn, Ur: NEGATIVE ng/mL
Creatinine(Crt), U: 69.2 mg/dL (ref 20.0–300.0)
Methadone Screen, Urine: NEGATIVE ng/mL
OXYCODONE+OXYMORPHONE UR QL SCN: NEGATIVE ng/mL
Opiate Scrn, Ur: NEGATIVE ng/mL
Ph of Urine: 7.1 (ref 4.5–8.9)
Phencyclidine Qn, Ur: NEGATIVE ng/mL
Propoxyphene Scrn, Ur: NEGATIVE ng/mL

## 2019-05-06 LAB — INTEGRATED 1
Crown Rump Length: 71.5 mm
Gest. Age on Collection Date: 13.1 weeks
Maternal Age at EDD: 23.6 yr
Nuchal Translucency (NT): 2.2 mm
Number of Fetuses: 1
PAPP-A Value: 1916.8 ng/mL
Weight: 238 [lb_av]

## 2019-05-06 LAB — OBSTETRIC PANEL, INCLUDING HIV
Antibody Screen: NEGATIVE
Basophils Absolute: 0 10*3/uL (ref 0.0–0.2)
Basos: 0 %
EOS (ABSOLUTE): 0.1 10*3/uL (ref 0.0–0.4)
Eos: 1 %
HIV Screen 4th Generation wRfx: NONREACTIVE
Hematocrit: 35.7 % (ref 34.0–46.6)
Hemoglobin: 12.1 g/dL (ref 11.1–15.9)
Hepatitis B Surface Ag: NEGATIVE
Immature Grans (Abs): 0 10*3/uL (ref 0.0–0.1)
Immature Granulocytes: 0 %
Lymphocytes Absolute: 2.3 10*3/uL (ref 0.7–3.1)
Lymphs: 19 %
MCH: 30.6 pg (ref 26.6–33.0)
MCHC: 33.9 g/dL (ref 31.5–35.7)
MCV: 90 fL (ref 79–97)
Monocytes Absolute: 0.6 10*3/uL (ref 0.1–0.9)
Monocytes: 5 %
Neutrophils Absolute: 9.2 10*3/uL — ABNORMAL HIGH (ref 1.4–7.0)
Neutrophils: 75 %
Platelets: 363 10*3/uL (ref 150–450)
RBC: 3.96 x10E6/uL (ref 3.77–5.28)
RDW: 13.1 % (ref 11.7–15.4)
RPR Ser Ql: NONREACTIVE
Rh Factor: POSITIVE
Rubella Antibodies, IGG: 5.36 index (ref >0.99)
WBC: 12.3 10*3/uL — ABNORMAL HIGH (ref 3.4–10.8)

## 2019-05-06 LAB — URINALYSIS, ROUTINE W REFLEX MICROSCOPIC
Bilirubin, UA: NEGATIVE
Glucose, UA: NEGATIVE
Ketones, UA: NEGATIVE
Leukocytes,UA: NEGATIVE
Nitrite, UA: NEGATIVE
Protein,UA: NEGATIVE
RBC, UA: NEGATIVE
Specific Gravity, UA: 1.012 (ref 1.005–1.030)
Urobilinogen, Ur: 0.2 mg/dL (ref 0.2–1.0)
pH, UA: 7.5 (ref 5.0–7.5)

## 2019-05-06 LAB — URINE CULTURE

## 2019-05-06 LAB — SICKLE CELL SCREEN: Sickle Cell Screen: NEGATIVE

## 2019-05-08 LAB — GC/CHLAMYDIA PROBE AMP
Chlamydia trachomatis, NAA: POSITIVE — AB
Neisseria Gonorrhoeae by PCR: NEGATIVE

## 2019-05-09 ENCOUNTER — Telehealth: Payer: Self-pay | Admitting: Obstetrics & Gynecology

## 2019-05-09 ENCOUNTER — Other Ambulatory Visit: Payer: Self-pay | Admitting: Women's Health

## 2019-05-09 ENCOUNTER — Encounter: Payer: Self-pay | Admitting: Women's Health

## 2019-05-09 DIAGNOSIS — A749 Chlamydial infection, unspecified: Secondary | ICD-10-CM

## 2019-05-09 MED ORDER — AZITHROMYCIN 500 MG PO TABS: 1000.0000 mg | ORAL_TABLET | Freq: Once | ORAL | 0 refills | Status: AC

## 2019-05-09 MED ORDER — DOXYLAMINE-PYRIDOXINE ER 20-20 MG PO TBCR: 1.0000 | EXTENDED_RELEASE_TABLET | Freq: Every day | ORAL | 8 refills | Status: DC

## 2019-05-09 NOTE — Telephone Encounter (Signed)
Patient called, she stated that she is and is requesting nausea medication.  [facility] [place]  [phone]

## 2019-05-13 ENCOUNTER — Telehealth: Payer: Self-pay | Admitting: Women's Health

## 2019-05-13 NOTE — Telephone Encounter (Signed)
Informed patient that rx was sent to [facility] in [place] and it would be sent to her. Patient agreeable and had no other questions at this time.

## 2019-05-13 NOTE — Telephone Encounter (Signed)
Needs to talk to someone about her meds going to wrong pharmacy

## 2019-06-07 ENCOUNTER — Encounter: Payer: Self-pay | Admitting: *Deleted

## 2019-06-08 ENCOUNTER — Other Ambulatory Visit: Payer: Self-pay

## 2019-06-08 ENCOUNTER — Ambulatory Visit (INDEPENDENT_AMBULATORY_CARE_PROVIDER_SITE_OTHER): Payer: BC Managed Care – PPO | Admitting: Obstetrics and Gynecology

## 2019-06-08 ENCOUNTER — Ambulatory Visit (INDEPENDENT_AMBULATORY_CARE_PROVIDER_SITE_OTHER): Payer: BC Managed Care – PPO

## 2019-06-08 VITALS — BP 135/87 | HR 92 | Wt 239.4 lb

## 2019-06-08 DIAGNOSIS — Z363 Encounter for antenatal screening for malformations: Secondary | ICD-10-CM | POA: Diagnosis not present

## 2019-06-08 DIAGNOSIS — Z3A18 18 weeks gestation of pregnancy: Secondary | ICD-10-CM

## 2019-06-08 DIAGNOSIS — Z3401 Encounter for supervision of normal first pregnancy, first trimester: Secondary | ICD-10-CM

## 2019-06-08 DIAGNOSIS — Z1389 Encounter for screening for other disorder: Secondary | ICD-10-CM

## 2019-06-08 DIAGNOSIS — Z3402 Encounter for supervision of normal first pregnancy, second trimester: Secondary | ICD-10-CM

## 2019-06-08 DIAGNOSIS — Z1379 Encounter for other screening for genetic and chromosomal anomalies: Secondary | ICD-10-CM

## 2019-06-08 DIAGNOSIS — Z331 Pregnant state, incidental: Secondary | ICD-10-CM

## 2019-06-08 LAB — POCT URINALYSIS DIPSTICK OB
Blood, UA: NEGATIVE
Glucose, UA: NEGATIVE
Ketones, UA: NEGATIVE
Leukocytes, UA: NEGATIVE
Nitrite, UA: NEGATIVE
POC,PROTEIN,UA: NEGATIVE

## 2019-06-08 NOTE — Patient Instructions (Addendum)
[place] [facility] Call to Register: [phone] or [phone]   or   Register Online: [email or web address] THESE CLASSES FILL UP VERY QUICKLY, SO SIGN UP AS SOON AS YOU CAN!!! Please visit [facility] pregnancy website at [email or web address]  Childbirth Classes  Option 1: Birth & Baby Series ? Series of 3 weekly classes, on the same day of the week (can choose Mon-Thurs) from 6-9pm ? Helps you and your support person prepare for childbirth ? Reviews newborn care, labor & birth, cesarean birth, pain management, and comfort techniques ? Cost: $60 per couple for insured or self-pay, $30 per couple for Medicaid  Option 2: Weekend Birth & Baby ? This class is a weekend version of our Birth & Baby series.  It is designed for parents who have a difficult time fitting several weeks of classes into their schedule.   ? Covers the care of your newborn and the basics of labor and childbirth ? Friday 6:30pm-8:30pm Saturday 9am-4pm, includes lunch for you and your partner  ? Cost: $75 per couple for insured or self-pay, $30 per couple for Medicaid  Option 3: Natural Childbirth ? This series of 5 weekly classes is for expectant parents who want to learn and practice natural methods of coping with the process of labor and childbirth.  Can choose Mon or Tues, 7-9pm.   ? Covers relaxation, breathing, massage, visualization, role of the partner, and helpful positioning ? Participants learn how to be confident in their body's ability to give birth. Class empowers and helps parents make informed decisions about care. Includes discussion that will help new parents transition into the immediate postpartum period.  ? Cost: $75 per couple for insured or self-pay, $30 per couple for Medicaid  Option 4: Online Birth & Baby ? This online class offers you the freedom to complete a Birth & Baby series in the comfort of your own home.  The flexibility of this option allows you to review  sections at your own pace, at times convenient to you and your support people.  It includes additional video information, animations, quizzes and extended activities. Get organized with helpful eClass tools, checklists, and trackers.  ? Cost: $60 for 60 days of online access                                                                            Other Available Classes  Baby & Me Enjoy this time to discuss newborn & infant parenting topics and family adjustment issues with other new mothers in a relaxed environment. Each week brings a new speaker or baby-centered activity. We encourage mothers and their babies (birth to crawling) to join [place]. You are welcome to visit this group even if you haven't delivered yet! It's wonderful to make new friends early and watch other moms interact with their babies. No registration or fee.  Big Brother/Big Sister Let your children share in the joy of a new brother or sister in this special class designed just for them. Discussion includes how families care for babies: swaddling, holding, diapering, safety, as well as how they can be helpful in their new role. This class is designed for children ages 23 to 73, but any age is  welcome. Please register each child individually. $5 °Breastfeeding Support Group °This group is a mother-to-mother support circle where moms have the opportunity to share their breastfeeding experiences. A Breastfeeding Support nurse is present for questions and concerns. An infant scale is available for weight checks. No fee or registration.  °Breastfeeding Your Baby °Breastfeeding is a special time for mother and child. This class will help you feel ready to begin this important relationship. Your partner is encouraged to attend with you. Learn what to expect and feel more confident in the first days of breastfeeding your newborn. This class also addresses the most common fears and challenges of breastfeeding during the first few weeks, months, and  beyond. $30 per couple °Caring for Baby °This class is for expectant and adoptive parents who want to learn and practice the most up-to-date newborn care for their babies. Focus is on birth through first six weeks of life. Topics include feeding, bathing, diapering, crying, umbilical cord care, circumcision care and safe sleep. Parents learn how to recognize symptoms of illness and when to call the pediatrician. Register only the mom-to-be and your partner can plan to come with you. (*Note: This class is included in the Birth & Baby series and the Weekend Birth & Baby classes.) $10 per couple °Comfort Techniques & Tour °This 2-hour interactive class is designed for those who either do not wish to take the Birth & Baby series or for those who prefer our online childbirth class, but don't want to miss the opportunity to learn and practice hands-on techniques. These skills can help relieve some of the discomfort of labor and encourage your baby to rotate toward the best position for birth. You and your partner will be able to try a variety of labor positions with birth balls and rebozos as well as practice breathing, relaxation, and visual techniques. $20 per couple °Daddy Boot Camp °This course offers Dads-to-be the tools and knowledge needed to feel confident on their journey to becoming new fathers. Experienced dads, who have been trained as coaches, teach dads-to-be how to hold, comfort, diapers, swaddle and play with their infant while being able to support the new mom as well. $25 °Grandparent Love °Expecting a grandbaby? Learn about the latest infant care and safety recommendations and ways to support your own child as he or she transitions into the parenting role. $10 per person °Infant and Child CPR °Parents, grandparents, babysitters, and friends learn Cardio-Pulmonary Resuscitation skills for infants and children. You will also learn how to treat both conscious and unconscious choking infants and children.  Register each participant individually. (Note: This Family & Friends program does not offer certification.) $20 per person °Marvelous Multiples °Expecting twins, triplets, or more? This free 2-hour class covers the differences in labor, birth, parenting, and breastfeeding issues that face multiples' parents.  °Maternity Care Center Virtual Tour ° Online virtual tour of the new [facility] at [place] ° °Mom Talk °This free mom-led group offers support and connection to mothers as they journey through the adjustments and struggles of that sometimes overwhelming first year after the birth of a child. A member of our staff will be present to share resources and additional support if needed, as you care for yourself and baby. You are welcome to visit this group before you deliver! It's wonderful to meet new friends early and watch other moms interact with their babies.  °Waterbirth Class °Interested in a waterbirth? This free informational class will help you discover whether   waterbirth is the right fit for you and is required if you are planning a waterbirth. Education about waterbirth itself, supplies you may need, and what you may need from your support team is included in this class. Partners are encouraged to come. ° ° ° °Please check out [email or web address]   for more information on childbirth classes  °  ° °

## 2019-06-08 NOTE — Progress Notes (Signed)
Patient ID: Alicia Adkins Andrepont, female   DOB: [birth year], 23 y.o.   MRN: [id number]    LOW-RISK PREGNANCY VISIT Patient name: Alicia Adkins Thueson MRN [id number]  Date of birth: [birth year] Chief Complaint:   Routine Prenatal Visit  History of Present Illness:   Alicia Adkins Brazier is a [age] y.o. 381P0000 female at 7232w1d with an Estimated Date of Delivery: 11/08/19 being seen today for ongoing management of a low-risk pregnancy. FOB baby is no longer in the picture. When she found out she was pregnant she told FOB and he decided to leave. FOB is 27 and already has kids.  Says she is doing okay since FOB has left, she says she cares but doesn't care, wants to move along in pregnancy.  She and partner don't talk about pregnancy between the two of them. She lives with her best friend, who is [place] support person and drove her to her appt today. Today she reports no complaints. Contractions: Not present. Vag. Bleeding: None.  Movement: Present. denies leaking of fluid. Review of Systems:   Pertinent items are noted in HPI Denies abnormal vaginal discharge w/ itching/odor/irritation, headaches, visual changes, shortness of breath, chest pain, abdominal pain, severe nausea/vomiting, or problems with urination or bowel movements unless otherwise stated above. Pertinent History Reviewed:  Reviewed past medical,surgical, social, obstetrical and family history.  Reviewed problem list, medications and allergies. Physical Assessment:   Vitals:   06/08/19 1014  BP: 135/87  Pulse: 92  Weight: 239 lb 6.4 oz (108.6 kg)  Body mass index is 41.74 kg/m.        Physical Examination:   General appearance: Well appearing, and in no distress  Mental status: Alert, oriented to person, place, and time  Skin: Warm & dry  Cardiovascular: Normal heart rate noted  Respiratory: Normal respiratory effort, no distress  Abdomen: Soft, gravid, nontender  Pelvic: Cervical exam deferred         Extremities: Edema:  None  Fetal Status:     Movement: Present    Results for orders placed or performed in visit on 06/08/19 (from the past 24 hour(s))  POC Urinalysis Dipstick OB   Collection Time: 06/08/19 10:13 AM  Result Value Ref Range   Color, UA     Clarity, UA     Glucose, UA Negative Negative   Bilirubin, UA     Ketones, UA neg    Spec Grav, UA     Blood, UA neg    pH, UA     POC,PROTEIN,UA Negative Negative, Trace, Small (1+), Moderate (2+), Large (3+), 4+   Urobilinogen, UA     Nitrite, UA neg    Leukocytes, UA Negative Negative   Appearance     Odor      Assessment & Plan:  1) Low-risk pregnancy G1P0000 at 2332w1d with an Estimated Date of Delivery: 11/08/19   2) Hx of depression, stable   Meds: No orders of the defined types were placed in this encounter.  Labs/procedures today: 2nd Its, US 18+1 wks,breech,right lateral placenta gr 0,normal ovaries bilat,cx 3.4 cm,fhr 146 bpm,svp of fluid 4.6 cm,efw 249 g 73%,anatomy complete,no obvious abnormalities   Plan:   1. Continue routine obstetrical care  2. F/u in 4 weeks  Follow-up: Return in about 4 weeks (around 07/06/2019).  Orders Placed This Encounter  Procedures  . INTEGRATED 2  . POC Urinalysis Dipstick OB   By signing my name below, I, Arnette NorrisMari Johnson, attest that this documentation has been prepared under the  direction and in the presence of Jonnie Kind, MD. Electronically Signed: [place]. 06/08/19. 10:30 AM.  I personally performed the services described in this documentation, which was SCRIBED in my presence. The recorded information has been reviewed and considered accurate. It has been edited as necessary during review. Jonnie Kind, MD

## 2019-06-08 NOTE — Progress Notes (Signed)
[place] 18+1 wks,breech,right lateral placenta gr 0,normal ovaries bilat,cx 3.4 cm,fhr 146 bpm,svp of fluid 4.6 cm,efw 249 g 73%,anatomy complete,no obvious abnormalities

## 2019-06-11 LAB — INTEGRATED 2
AFP MoM: 1.23
Alpha-Fetoprotein: 37.8 ng/mL
Crown Rump Length: 71.5 mm
DIA MoM: 0.94
DIA Value: 121.6 pg/mL
Estriol, Unconjugated: 1.16 ng/mL
Gest. Age on Collection Date: 13.1 weeks
Gestational Age: 18.1 weeks
Maternal Age at EDD: 23.6 yr
Nuchal Translucency (NT): 2.2 mm
Nuchal Translucency MoM: 1.35
Number of Fetuses: 1
PAPP-A MoM: 2.59
PAPP-A Value: 1916.8 ng/mL
Test Results:: NEGATIVE
Weight: 238 [lb_av]
Weight: 238 [lb_av]
hCG MoM: 1.85
hCG Value: 33.5 IU/mL
uE3 MoM: 0.95

## 2019-07-06 ENCOUNTER — Other Ambulatory Visit: Payer: Self-pay

## 2019-07-06 ENCOUNTER — Ambulatory Visit (INDEPENDENT_AMBULATORY_CARE_PROVIDER_SITE_OTHER): Payer: BC Managed Care – PPO | Admitting: Advanced Practice Midwife

## 2019-07-06 ENCOUNTER — Encounter: Payer: Self-pay | Admitting: Advanced Practice Midwife

## 2019-07-06 VITALS — BP 127/79 | HR 97 | Wt 241.0 lb

## 2019-07-06 DIAGNOSIS — Z3A22 22 weeks gestation of pregnancy: Secondary | ICD-10-CM

## 2019-07-06 DIAGNOSIS — Z1389 Encounter for screening for other disorder: Secondary | ICD-10-CM

## 2019-07-06 DIAGNOSIS — Z113 Encounter for screening for infections with a predominantly sexual mode of transmission: Secondary | ICD-10-CM

## 2019-07-06 DIAGNOSIS — Z3402 Encounter for supervision of normal first pregnancy, second trimester: Secondary | ICD-10-CM

## 2019-07-06 DIAGNOSIS — Z331 Pregnant state, incidental: Secondary | ICD-10-CM

## 2019-07-06 LAB — POCT URINALYSIS DIPSTICK OB
Blood, UA: NEGATIVE
Glucose, UA: NEGATIVE
Leukocytes, UA: NEGATIVE
Nitrite, UA: NEGATIVE

## 2019-07-06 NOTE — Patient Instructions (Addendum)
1. Before your test, do not eat or drink anything for 8-10 hours prior to your  appointment (a small amount of water is allowed and you may take any medicines you normally take). Be sure to drink lots of water the day before. 2. When you arrive, your blood will be drawn for a 'fasting' blood sugar level.  Then you will be given a sweetened carbonated beverage to drink. You should  complete drinking this beverage within five minutes. After finishing the  beverage, you will have your blood drawn exactly 1 and 2 hours later. Having  your blood drawn on time is an important part of this test. A total of three blood  samples will be done. 3. The test takes approximately 2  hours. During the test, do not have anything to  eat or drink. Do not smoke, chew gum (not even sugarless gum) or use breath mints.  4. During the test you should remain close by and seated as much as possible and  avoid walking around. You may want to bring a book or something else to  occupy your time.  5. After your test, you may eat and drink as normal. You may want to bring a snack  to eat after the test is finished. Your provider will advise you as to the results of  this test and any follow-up if necessary  If your sugar test is positive for gestational diabetes, you will be given an phone call and further instructions discussed. If you wish to know all of your test results before your next appointment, feel free to call the office, or look up your test results on Mychart.  (The range that the lab uses for normal values of the sugar test are not necessarily the range that is used for pregnant women; if your results are within the normal range, they are definitely normal.  However, if a value is deemed "high" by the lab, it may not be too high for a pregnant woman.  We will need to discuss the results if your value(s) fall in the "high" category).     Tdap Vaccine  It is recommended that you get the Tdap vaccine during the  third trimester of EACH pregnancy to help protect your baby from getting pertussis (whooping cough)  27-36 weeks is the BEST time to do this so that you can pass the protection on to your baby. During pregnancy is better than after pregnancy, but if you are unable to get it during pregnancy it will be offered at the hospital.  You will be offered this vaccine in the office after 27 weeks.  If you do not have health insurance, you can get the vaccine from the [facility] (no appointment needed).   Everyone who will be around your baby should also be up-to-date on their vaccines. Adults (who are not pregnant) only need 1 dose of Tdap during adulthood.     [place] Pediatricians/Family Doctors:  [facility] [phone]            [facility] [phone]                 [facility] [phone] (usually not accepting new patients unless you have family there already, you are always welcome to call and ask)       [facility] [phone]       [place] Pediatricians/Family Doctors:   [facility]: [phone]  [facility]: [phone]  [facility] [place]: [phone]  [place] Family Doctors:   [facility]: [phone]   [facility]: [phone]  [place] Family Doctors:  [facility]: [phone]   

## 2019-07-06 NOTE — Addendum Note (Signed)
Addended by: Diona Fanti A on: 07/06/2019 11:46 AM   Modules accepted: Orders

## 2019-07-06 NOTE — Progress Notes (Signed)
   LOW-RISK PREGNANCY VISIT Patient name: Alicia Adkins MRN [id number]  Date of birth: 1996/07/07 Chief Complaint:   Routine Prenatal Visit  History of Present Illness:   Alicia Adkins is a 23 y.o. G36P0000 female at [redacted]w[redacted]d with an Estimated Date of Delivery: 11/08/19 being seen today for ongoing management of a low-risk pregnancy.  Today she reports no complaints. Contractions: Not present.  .  Movement: Present. denies leaking of fluid. Review of Systems:   Pertinent items are noted in HPI Denies abnormal vaginal discharge w/ itching/odor/irritation, headaches, visual changes, shortness of breath, chest pain, abdominal pain, severe nausea/vomiting, or problems with urination or bowel movements unless otherwise stated above.  Pertinent History Reviewed:  Medical & Surgical Hx:   Past Medical History:  Diagnosis Date  . Depression   . Medical history non-contributory   . Trichimoniasis 08/06/2018   +trich on pap, treated with flagyl, POC 8/26___   Past Surgical History:  Procedure Laterality Date  . WISDOM TOOTH EXTRACTION     Family History  Problem Relation Age of Onset  . Heart attack Paternal Grandfather   . Hypertension Maternal Grandmother   . Cancer Mother        cervical  . Diabetes Mother   . Hypertension Mother     Current Outpatient Medications:  .  Blood Pressure Monitor MISC, For regular home bp monitoring during pregnancy, Disp: 1 each, Rfl: 0 .  prenatal vitamin w/FE, FA (PRENATAL 1 + 1) 27-1 MG TABS tablet, Take 1 tablet by mouth daily at 12 noon., Disp: 30 each, Rfl: 12 .  Doxylamine-Pyridoxine ER (BONJESTA) 20-20 MG TBCR, Take 1 tablet by mouth at bedtime. Can add 1 tablet in the morning if needed for nausea and vomiting (Patient not taking: Reported on 06/08/2019), Disp: 60 tablet, Rfl: 8 .  multivitamin (VIT W/EXTRA C) CHEW chewable tablet, Chew 2 tablets by mouth daily., Disp: , Rfl:  Social History: Reviewed -  reports that she has never smoked.  She has never used smokeless tobacco.  Physical Assessment:   Vitals:   07/06/19 1117  BP: 127/79  Pulse: 97  Weight: 241 lb (109.3 kg)  Body mass index is 42.02 kg/m.        Physical Examination:   General appearance: Well appearing, and in no distress  Mental status: Alert, oriented to person, place, and time  Skin: Warm & dry  Cardiovascular: Normal heart rate noted  Respiratory: Normal respiratory effort, no distress  Abdomen: Soft, gravid, nontender  Pelvic: Cervical exam deferred         Extremities: Edema: None  Fetal Status: Fetal Heart Rate (bpm): 147 Fundal Height: 23 cm Movement: Present    No results found for this or any previous visit (from the past 24 hour(s)).  Assessment & Plan:  1) Low-risk pregnancy G1P0000 at [redacted]w[redacted]d with an Estimated Date of Delivery: 11/08/19      Labs/procedures/US today: POC CHL  Plan:  Continue routine obstetrical care    Follow-up: Return in about 4 weeks (around 08/03/2019) for PN2/LROB.  Orders Placed This Encounter  Procedures  . GC/Chlamydia Probe Amp   Christin Fudge CNM 07/06/2019 11:37 AM

## 2019-07-13 LAB — GC/CHLAMYDIA PROBE AMP
Chlamydia trachomatis, NAA: NEGATIVE
Neisseria Gonorrhoeae by PCR: NEGATIVE

## 2019-08-03 ENCOUNTER — Ambulatory Visit (INDEPENDENT_AMBULATORY_CARE_PROVIDER_SITE_OTHER): Payer: BC Managed Care – PPO | Admitting: Advanced Practice Midwife

## 2019-08-03 ENCOUNTER — Other Ambulatory Visit: Payer: Self-pay

## 2019-08-03 ENCOUNTER — Other Ambulatory Visit: Payer: BC Managed Care – PPO

## 2019-08-03 ENCOUNTER — Encounter: Payer: Self-pay | Admitting: Advanced Practice Midwife

## 2019-08-03 VITALS — BP 134/83 | HR 99 | Wt 246.0 lb

## 2019-08-03 DIAGNOSIS — O0990 Supervision of high risk pregnancy, unspecified, unspecified trimester: Secondary | ICD-10-CM

## 2019-08-03 DIAGNOSIS — O0992 Supervision of high risk pregnancy, unspecified, second trimester: Secondary | ICD-10-CM

## 2019-08-03 DIAGNOSIS — I10 Essential (primary) hypertension: Secondary | ICD-10-CM | POA: Insufficient documentation

## 2019-08-03 DIAGNOSIS — O10012 Pre-existing essential hypertension complicating pregnancy, second trimester: Secondary | ICD-10-CM

## 2019-08-03 DIAGNOSIS — Z3402 Encounter for supervision of normal first pregnancy, second trimester: Secondary | ICD-10-CM

## 2019-08-03 DIAGNOSIS — Z3A26 26 weeks gestation of pregnancy: Secondary | ICD-10-CM

## 2019-08-03 DIAGNOSIS — A749 Chlamydial infection, unspecified: Secondary | ICD-10-CM

## 2019-08-03 DIAGNOSIS — Z331 Pregnant state, incidental: Secondary | ICD-10-CM

## 2019-08-03 DIAGNOSIS — Z1389 Encounter for screening for other disorder: Secondary | ICD-10-CM

## 2019-08-03 LAB — POCT URINALYSIS DIPSTICK OB
Blood, UA: NEGATIVE
Glucose, UA: NEGATIVE
Ketones, UA: NEGATIVE
Nitrite, UA: NEGATIVE
POC,PROTEIN,UA: NEGATIVE

## 2019-08-03 MED ORDER — ASPIRIN EC 81 MG PO TBEC: 162.0000 mg | DELAYED_RELEASE_TABLET | Freq: Every day | ORAL | 6 refills | Status: DC

## 2019-08-03 NOTE — Patient Instructions (Signed)
Alicia Adkins, I greatly value your feedback.  If you receive a survey following your visit with [place] today, we appreciate you taking the time to fill it out.  Thanks, Nigel Berthold, CNM   Call the office [phone]) or go to [facility] if:  You begin to have strong, frequent contractions  Your water breaks.  Sometimes it is a big gush of fluid, sometimes it is just a trickle that keeps getting your panties wet or running down your legs  You have vaginal bleeding.  It is normal to have a small amount of spotting if your cervix was checked.   You don't feel your baby moving like normal.  If you don't, get you something to eat and drink and lay down and focus on feeling your baby move.  You should feel at least 10 movements in 2 hours.  If you don't, you should call the office or go to [facility].    Tdap Vaccine  It is recommended that you get the Tdap vaccine during the third trimester of EACH pregnancy to help protect your baby from getting pertussis (whooping cough)  27-36 weeks is the BEST time to do this so that you can pass the protection on to your baby. During pregnancy is better than after pregnancy, but if you are unable to get it during pregnancy it will be offered at the hospital.   You will be offered this vaccine in the office after 27 weeks. If you do not have health insurance, you can get this vaccine at the health department or your family doctor  Everyone who will be around your baby should also be up-to-date on their vaccines. Adults (who are not pregnant) only need 1 dose of Tdap during adulthood.   Third Trimester of Pregnancy The third trimester is from week 29 through week 42, months 7 through 9. The third trimester is a time when the fetus is growing rapidly. At the end of the ninth month, the fetus is about 20 inches in length and weighs 6-10 pounds.  BODY CHANGES Your body goes through many changes during pregnancy. The changes vary from  woman to woman.   Your weight will continue to increase. You can expect to gain 25-35 pounds (11-16 kg) by the end of the pregnancy.  You may begin to get stretch marks on your hips, abdomen, and breasts.  You may urinate more often because the fetus is moving lower into your pelvis and pressing on your bladder.  You may develop or continue to have heartburn as a result of your pregnancy.  You may develop constipation because certain hormones are causing the muscles that push waste through your intestines to slow down.  You may develop hemorrhoids or swollen, bulging veins (varicose veins).  You may have pelvic pain because of the weight gain and pregnancy hormones relaxing your joints between the bones in your pelvis. Backaches may result from overexertion of the muscles supporting your posture.  You may have changes in your hair. These can include thickening of your hair, rapid growth, and changes in texture. Some women also have hair loss during or after pregnancy, or hair that feels dry or thin. Your hair will most likely return to normal after your baby is born.  Your breasts will continue to grow and be tender. A yellow discharge may leak from your breasts called colostrum.  Your belly button may stick out.  You may feel short of breath because of your expanding uterus.  You may notice the fetus "dropping," or moving lower in your abdomen.  You may have a bloody mucus discharge. This usually occurs a few days to a week before labor begins.  Your cervix becomes thin and soft (effaced) near your due date. WHAT TO EXPECT AT YOUR PRENATAL EXAMS  You will have prenatal exams every 2 weeks until week 36. Then, you will have weekly prenatal exams. During a routine prenatal visit:  You will be weighed to make sure you and the fetus are growing normally.  Your blood pressure is taken.  Your abdomen will be measured to track your baby's growth.  The fetal heartbeat will be listened  to.  Any test results from the previous visit will be discussed.  You may have a cervical check near your due date to see if you have effaced. At around 36 weeks, your caregiver will check your cervix. At the same time, your caregiver will also perform a test on the secretions of the vaginal tissue. This test is to determine if a type of bacteria, Group B streptococcus, is present. Your caregiver will explain this further. Your caregiver may ask you:  What your birth plan is.  How you are feeling.  If you are feeling the baby move.  If you have had any abnormal symptoms, such as leaking fluid, bleeding, severe headaches, or abdominal cramping.  If you have any questions. Other tests or screenings that may be performed during your third trimester include:  Blood tests that check for low iron levels (anemia).  Fetal testing to check the health, activity level, and growth of the fetus. Testing is done if you have certain medical conditions or if there are problems during the pregnancy. FALSE LABOR You may feel small, irregular contractions that eventually go away. These are called Braxton Hicks contractions, or false labor. Contractions may last for hours, days, or even weeks before true labor sets in. If contractions come at regular intervals, intensify, or become painful, it is best to be seen by your caregiver.  SIGNS OF LABOR   Menstrual-like cramps.  Contractions that are 5 minutes apart or less.  Contractions that start on the top of the uterus and spread down to the lower abdomen and back.  A sense of increased pelvic pressure or back pain.  A watery or bloody mucus discharge that comes from the vagina. If you have any of these signs before the 37th week of pregnancy, call your caregiver right away. You need to go to the hospital to get checked immediately. HOME CARE INSTRUCTIONS   Avoid all smoking, herbs, alcohol, and unprescribed drugs. These chemicals affect the  formation and growth of the baby.  Follow your caregiver's instructions regarding medicine use. There are medicines that are either safe or unsafe to take during pregnancy.  Exercise only as directed by your caregiver. Experiencing uterine cramps is a good sign to stop exercising.  Continue to eat regular, healthy meals.  Wear a good support bra for breast tenderness.  Do not use hot tubs, steam rooms, or saunas.  Wear your seat belt at all times when driving.  Avoid raw meat, uncooked cheese, cat litter boxes, and soil used by cats. These carry germs that can cause birth defects in the baby.  Take your prenatal vitamins.  Try taking a stool softener (if your caregiver approves) if you develop constipation. Eat more high-fiber foods, such as fresh vegetables or fruit and whole grains. Drink plenty of fluids to keep your urine  clear or pale yellow.  Take warm sitz baths to soothe any pain or discomfort caused by hemorrhoids. Use hemorrhoid cream if your caregiver approves.  If you develop varicose veins, wear support hose. Elevate your feet for 15 minutes, 3-4 times a day. Limit salt in your diet.  Avoid heavy lifting, wear low heal shoes, and practice good posture.  Rest a lot with your legs elevated if you have leg cramps or low back pain.  Visit your dentist if you have not gone during your pregnancy. Use a soft toothbrush to brush your teeth and be gentle when you floss.  A sexual relationship may be continued unless your caregiver directs you otherwise.  Do not travel far distances unless it is absolutely necessary and only with the approval of your caregiver.  Take prenatal classes to understand, practice, and ask questions about the labor and delivery.  Make a trial run to the hospital.  Pack your hospital bag.  Prepare the baby's nursery.  Continue to go to all your prenatal visits as directed by your caregiver. SEEK MEDICAL CARE IF:  You are unsure if you are in  labor or if your water has broken.  You have dizziness.  You have mild pelvic cramps, pelvic pressure, or nagging pain in your abdominal area.  You have persistent nausea, vomiting, or diarrhea.  You have a bad smelling vaginal discharge.  You have pain with urination. SEEK IMMEDIATE MEDICAL CARE IF:   You have a fever.  You are leaking fluid from your vagina.  You have spotting or bleeding from your vagina.  You have severe abdominal cramping or pain.  You have rapid weight loss or gain.  You have shortness of breath with chest pain.  You notice sudden or extreme swelling of your face, hands, ankles, feet, or legs.  You have not felt your baby move in over an hour.  You have severe headaches that do not go away with medicine.  You have vision changes. Document Released: 12/02/2001 Document Revised: 12/13/2013 Document Reviewed: 02/08/2013 [facility] Patient Information 2015 [place], [place]. This information is not intended to replace advice given to you by your health care provider. Make sure you discuss any questions you have with your health care provider.

## 2019-08-03 NOTE — Progress Notes (Signed)
LOW-RISK PREGNANCY VISIT Patient name: Alicia Adkins MRN [id number]  Date of birth: [birth year] Chief Complaint:   Routine Prenatal Visit (PN2)  History of Present Illness:   Alicia Adkins is a 23 y.o. 651P0000 female at 10963w1d with an Estimated Date of Delivery: 11/08/19 being seen today for ongoing management of a low-risk pregnancy.  Today she reports no complaints. Contractions: Not present. Vag. Bleeding: None.  Movement: Present. denies leaking of fluid. Review of Systems:   Pertinent items are noted in HPI Denies abnormal vaginal discharge w/ itching/odor/irritation, headaches, visual changes, shortness of breath, chest pain, abdominal pain, severe nausea/vomiting, or problems with urination or bowel movements unless otherwise stated above.  Pertinent History Reviewed:  Medical & Surgical Hx:   Past Medical History:  Diagnosis Date  . Depression   . Medical history non-contributory   . Trichimoniasis 08/06/2018   +trich on pap, treated with flagyl, POC 8/26___   Past Surgical History:  Procedure Laterality Date  . WISDOM TOOTH EXTRACTION     Family History  Problem Relation Age of Onset  . Heart attack Paternal Grandfather   . Hypertension Maternal Grandmother   . Cancer Mother        cervical  . Diabetes Mother   . Hypertension Mother     Current Outpatient Medications:  .  Blood Pressure Monitor MISC, For regular home bp monitoring during pregnancy, Disp: 1 each, Rfl: 0 .  prenatal vitamin w/FE, FA (PRENATAL 1 + 1) 27-1 MG TABS tablet, Take 1 tablet by mouth daily at 12 noon., Disp: 30 each, Rfl: 12 .  aspirin EC 81 MG tablet, Take 2 tablets (162 mg total) by mouth daily., Disp: 60 tablet, Rfl: 6 Social History: Reviewed -  reports that she has never smoked. She has never used smokeless tobacco.  Physical Assessment:   Vitals:   08/03/19 0915 08/03/19 0917  BP: (!) 142/77 134/83  Pulse: (!) 109 99  Weight: 246 lb (111.6 kg)   Body mass index is  42.89 kg/m.    Pt hasn't been checking BP at home, has trouble getting it on her arm by herself (lives alone).  Review of chart reveals ^ BPs since 2017, though never received formal dx. Will dx w/CHTN today.         Physical Examination:   General appearance: Well appearing, and in no distress  Mental status: Alert, oriented to person, place, and time  Skin: Warm & dry  Cardiovascular: Normal heart rate noted  Respiratory: Normal respiratory effort, no distress  Abdomen: Soft, gravid, nontender  Pelvic: Cervical exam deferred         Extremities: Edema: Trace  Fetal Status:     Movement: Present    Results for orders placed or performed in visit on 08/03/19 (from the past 24 hour(s))  POC Urinalysis Dipstick OB   Collection Time: 08/03/19  9:16 AM  Result Value Ref Range   Color, UA     Clarity, UA     Glucose, UA Negative Negative   Bilirubin, UA     Ketones, UA neg    Spec Grav, UA     Blood, UA neg    pH, UA     POC,PROTEIN,UA Negative Negative, Trace, Small (1+), Moderate (2+), Large (3+), 4+   Urobilinogen, UA     Nitrite, UA neg    Leukocytes, UA Moderate (2+) (A) Negative   Appearance     Odor      Assessment & Plan:  1) High-risk pregnancy G1P0000 at [redacted]w[redacted]d with an Estimated Date of Delivery: 11/08/19   2) CHTN: , start testing, labs, etc for CHTN/no meds. To bring BP cuff to next visit so we can evaluate for wrist accuracy.    Labs/procedures/US today: PN2  Plan: Grwoth [place] 28, 33, 36wks     2x/wk testing nst/sono @ 36wks or weekly BPP   Deliver @ 39-40wks:______    Follow-up: Return in about 2 weeks (around 08/17/2019) for HROB, US:EFW, check BP cuff w/pt.  Orders Placed This Encounter  Procedures  . US OB Follow Up  . Protein, urine, 24 hour  . Comprehensive metabolic panel  . POC Urinalysis Dipstick OB   Christin Fudge CNM 08/03/2019 9:43 AM

## 2019-08-04 LAB — CBC
Hematocrit: 31.7 % — ABNORMAL LOW (ref 34.0–46.6)
Hemoglobin: 10.9 g/dL — ABNORMAL LOW (ref 11.1–15.9)
MCH: 30 pg (ref 26.6–33.0)
MCHC: 34.4 g/dL (ref 31.5–35.7)
MCV: 87 fL (ref 79–97)
Platelets: 314 10*3/uL (ref 150–450)
RBC: 3.63 x10E6/uL — ABNORMAL LOW (ref 3.77–5.28)
RDW: 12.7 % (ref 11.7–15.4)
WBC: 12.6 10*3/uL — ABNORMAL HIGH (ref 3.4–10.8)

## 2019-08-04 LAB — GLUCOSE TOLERANCE, 2 HOURS W/ 1HR
Glucose, 1 hour: 172 mg/dL (ref 65–179)
Glucose, 2 hour: 117 mg/dL (ref 65–152)
Glucose, Fasting: 90 mg/dL (ref 65–91)

## 2019-08-04 LAB — RPR: RPR Ser Ql: NONREACTIVE

## 2019-08-04 LAB — HIV ANTIBODY (ROUTINE TESTING W REFLEX): HIV Screen 4th Generation wRfx: NONREACTIVE

## 2019-08-04 LAB — ANTIBODY SCREEN: Antibody Screen: NEGATIVE

## 2019-08-08 ENCOUNTER — Telehealth: Payer: Self-pay | Admitting: Obstetrics & Gynecology

## 2019-08-08 NOTE — Telephone Encounter (Signed)
Pt requesting a call to discuss her glucose test results.

## 2019-08-08 NOTE — Telephone Encounter (Signed)
Pt aware sugar test results were within normal range. No gestational diabetes. Pt voiced understanding. [place]

## 2019-08-16 LAB — COMPREHENSIVE METABOLIC PANEL
ALT: 25 IU/L (ref 0–32)
AST: 22 IU/L (ref 0–40)
Albumin/Globulin Ratio: 1.1 — ABNORMAL LOW (ref 1.2–2.2)
Albumin: 3.5 g/dL — ABNORMAL LOW (ref 3.9–5.0)
Alkaline Phosphatase: 69 IU/L (ref 39–117)
BUN/Creatinine Ratio: 13 (ref 9–23)
BUN: 7 mg/dL (ref 6–20)
Bilirubin Total: <0.2 mg/dL (ref 0.0–1.2)
CO2: 19 mmol/L — ABNORMAL LOW (ref 20–29)
Calcium: 9.5 mg/dL (ref 8.7–10.2)
Chloride: 105 mmol/L (ref 96–106)
Creatinine, Ser: 0.54 mg/dL — ABNORMAL LOW (ref 0.57–1.00)
GFR calc Af Amer: 154 mL/min/{1.73_m2} (ref >59)
GFR calc non Af Amer: 133 mL/min/{1.73_m2} (ref >59)
Globulin, Total: 3.1 g/dL (ref 1.5–4.5)
Glucose: 84 mg/dL (ref 65–99)
Potassium: 4 mmol/L (ref 3.5–5.2)
Sodium: 138 mmol/L (ref 134–144)
Total Protein: 6.6 g/dL (ref 6.0–8.5)

## 2019-08-16 LAB — PROTEIN, URINE, 24 HOUR
Protein, 24H Urine: 502 mg/24 hr — ABNORMAL HIGH (ref 30–150)
Protein, Ur: 34.6 mg/dL

## 2019-08-17 ENCOUNTER — Other Ambulatory Visit: Payer: Self-pay

## 2019-08-17 ENCOUNTER — Ambulatory Visit (INDEPENDENT_AMBULATORY_CARE_PROVIDER_SITE_OTHER): Payer: BC Managed Care – PPO | Admitting: Student

## 2019-08-17 ENCOUNTER — Ambulatory Visit (INDEPENDENT_AMBULATORY_CARE_PROVIDER_SITE_OTHER): Payer: BC Managed Care – PPO

## 2019-08-17 VITALS — BP 147/81 | HR 109 | Wt 247.0 lb

## 2019-08-17 DIAGNOSIS — O119 Pre-existing hypertension with pre-eclampsia, unspecified trimester: Secondary | ICD-10-CM | POA: Insufficient documentation

## 2019-08-17 DIAGNOSIS — O10013 Pre-existing essential hypertension complicating pregnancy, third trimester: Secondary | ICD-10-CM

## 2019-08-17 DIAGNOSIS — O0992 Supervision of high risk pregnancy, unspecified, second trimester: Secondary | ICD-10-CM

## 2019-08-17 DIAGNOSIS — O113 Pre-existing hypertension with pre-eclampsia, third trimester: Secondary | ICD-10-CM

## 2019-08-17 DIAGNOSIS — Z3A28 28 weeks gestation of pregnancy: Secondary | ICD-10-CM | POA: Diagnosis not present

## 2019-08-17 DIAGNOSIS — I10 Essential (primary) hypertension: Secondary | ICD-10-CM

## 2019-08-17 DIAGNOSIS — O0990 Supervision of high risk pregnancy, unspecified, unspecified trimester: Secondary | ICD-10-CM

## 2019-08-17 DIAGNOSIS — O0993 Supervision of high risk pregnancy, unspecified, third trimester: Secondary | ICD-10-CM

## 2019-08-17 NOTE — Patient Instructions (Signed)
-Come to [facility] if blood pressure is greater than 150/100 or if you develop really bad headache that isn't resolved with Tylenol or usual comfort measures.  -[facility] if you have blurry vision, floating.  -  Preeclampsia and Eclampsia Preeclampsia is a serious condition that may develop during pregnancy. This condition causes high blood pressure and increased protein in your urine along with other symptoms, such as headaches and vision changes. These symptoms may develop as the condition gets worse. Preeclampsia may occur at 20 weeks of pregnancy or later. Diagnosing and treating preeclampsia early is very important. If not treated early, it can cause serious problems for you and your baby. One problem it can lead to is eclampsia. Eclampsia is a condition that causes muscle jerking or shaking (convulsions or seizures) and other serious problems for the mother. During pregnancy, delivering your baby may be the best treatment for preeclampsia or eclampsia. For most women, preeclampsia and eclampsia symptoms go away after giving birth. In rare cases, a woman may develop preeclampsia after giving birth (postpartum preeclampsia). This usually occurs within 48 hours after childbirth but may occur up to 6 weeks after giving birth. What are the causes? The cause of preeclampsia is not known. What increases the risk? The following risk factors make you more likely to develop preeclampsia:  Being pregnant for the first time.  Having had preeclampsia during a past pregnancy.  Having a family history of preeclampsia.  Having high blood pressure.  Being pregnant with more than one baby.  Being 89 or older.  Being African-American.  Having kidney disease or diabetes.  Having medical conditions such as lupus or blood diseases.  Being very overweight (obese). What are the signs or symptoms? The most common symptoms are:  Severe headaches.   Vision problems, such as blurred or double vision.  Abdominal pain, especially upper abdominal pain. Other symptoms that may develop as the condition gets worse include:  Sudden weight gain.  Sudden swelling of the hands, face, legs, and feet.  Severe nausea and vomiting.  Numbness in the face, arms, legs, and feet.  Dizziness.  Urinating less than usual.  Slurred speech.  Convulsions or seizures. How is this diagnosed? There are no screening tests for preeclampsia. Your health care provider will ask you about symptoms and check for signs of preeclampsia during your prenatal visits. You may also have tests that include:  Checking your blood pressure.  Urine tests to check for protein. Your health care provider will check for this at every prenatal visit.  Blood tests.  Monitoring your baby's heart rate.  Ultrasound. How is this treated? You and your health care provider will determine the treatment approach that is best for you. Treatment may include:  Having more frequent prenatal exams to check for signs of preeclampsia, if you have an increased risk for preeclampsia.  Medicine to lower your blood pressure.  Staying in the hospital, if your condition is severe. There, treatment will focus on controlling your blood pressure and the amount of fluids in your body (fluid retention).  Taking medicine (magnesium sulfate) to prevent seizures. This may be given as an injection or through an IV.  Taking a low-dose aspirin during your pregnancy.  Delivering your baby early. You may have your labor started with medicine (induced), or you may have a cesarean delivery. Follow these instructions at home: Eating and drinking   Drink enough fluid to keep your urine pale yellow.  Avoid caffeine. Lifestyle  Do not use any products that contain nicotine or tobacco, such as cigarettes and e-cigarettes. If you need help quitting, ask your health care provider.  Do not use  alcohol or drugs.  Avoid stress as much as possible. Rest and get plenty of sleep. General instructions  Take over-the-counter and prescription medicines only as told by your health care provider.  When lying down, lie on your left side. This keeps pressure off your major blood vessels.  When sitting or lying down, raise (elevate) your feet. Try putting some pillows underneath your lower legs.  Exercise regularly. Ask your health care provider what kinds of exercise are best for you.  Keep all follow-up and prenatal visits as told by your health care provider. This is important. How is this prevented? There is no known way of preventing preeclampsia or eclampsia from developing. However, to lower your risk of complications and detect problems early:  Get regular prenatal care. Your health care provider may be able to diagnose and treat the condition early.  Maintain a healthy weight. Ask your health care provider for help managing weight gain during pregnancy.  Work with your health care provider to manage any long-term (chronic) health conditions you have, such as diabetes or kidney problems.  You may have tests of your blood pressure and kidney function after giving birth.  Your health care provider may have you take low-dose aspirin during your next pregnancy. Contact a health care provider if:  You have symptoms that your health care provider told you may require more treatment or monitoring, such as: ? Headaches. ? Nausea or vomiting. ? Abdominal pain. ? Dizziness. ? Light-headedness. Get help right away if:  You have severe: ? Abdominal pain. ? Headaches that do not get better. ? Dizziness. ? Vision problems. ? Confusion. ? Nausea or vomiting.  You have any of the following: ? A seizure. ? Sudden, rapid weight gain. ? Sudden swelling in your hands, ankles, or face. ? Trouble moving any part of your body. ? Numbness in any part of your body. ? Trouble speaking.  ? Abnormal bleeding.  You faint. Summary  Preeclampsia is a serious condition that may develop during pregnancy.  This condition causes high blood pressure and increased protein in your urine along with other symptoms, such as headaches and vision changes.  Diagnosing and treating preeclampsia early is very important. If not treated early, it can cause serious problems for you and your baby.  Get help right away if you have symptoms that your health care provider told you to watch for. This information is not intended to replace advice given to you by your health care provider. Make sure you discuss any questions you have with your health care provider. Document Released: 12/05/2000 Document Revised: 08/10/2018 Document Reviewed: 07/14/2016 Elsevier Patient Education  2020 [employer].   How to Take Your Blood Pressure You can take your blood pressure at home with a machine. You may need to check your blood pressure at home:  To check if you have high blood pressure (hypertension).  To check your blood pressure over time.  To make sure your blood pressure medicine is working. Supplies needed: You will need a blood pressure machine, or monitor. You can buy one at a drugstore or online. When choosing one:  Choose one with an arm cuff.  Choose one that wraps around your upper arm. Only one finger should fit between your arm and the cuff.  Do not choose  one that measures your blood pressure from your wrist or finger. Your doctor can suggest a monitor. How to prepare Avoid these things for 30 minutes before checking your blood pressure:  Drinking caffeine.  Drinking alcohol.  Eating.  Smoking.  Exercising. Five minutes before checking your blood pressure:  Pee.  Sit in a dining chair. Avoid sitting in a soft couch or armchair.  Be quiet. Do not talk. How to take your blood pressure Follow the instructions that came with your machine. If you have a digital blood  pressure monitor, these may be the instructions: 1. Sit up straight. 2. Place your feet on the floor. Do not cross your ankles or legs. 3. Rest your left arm at the level of your heart. You may rest it on a table, desk, or chair. 4. Pull up your shirt sleeve. 5. Wrap the blood pressure cuff around the upper part of your left arm. The cuff should be 1 inch (2.5 cm) above your elbow. It is best to wrap the cuff around bare skin. 6. Fit the cuff snugly around your arm. You should be able to place only one finger between the cuff and your arm. 7. Put the cord inside the groove of your elbow. 8. Press the power button. 9. Sit quietly while the cuff fills with air and loses air. 10. Write down the numbers on the screen. 11. Wait 2-3 minutes and then repeat steps 1-10. What do the numbers mean? Two numbers make up your blood pressure. The first number is called systolic pressure. The second is called diastolic pressure. An example of a blood pressure reading is "120 over 80" (or 120/80). If you are an adult and do not have a medical condition, use this guide to find out if your blood pressure is normal: Normal  First number: below 120.  Second number: below 80. Elevated  First number: 120-129.  Second number: below 80. Hypertension stage 1  First number: 130-139.  Second number: 80-89. Hypertension stage 2  First number: 140 or above.  Second number: 3 or above. Your blood pressure is above normal even if only the top or bottom number is above normal. Follow these instructions at home:  Check your blood pressure as often as your doctor tells you to.  Take your monitor to your next doctor's appointment. Your doctor will: ? Make sure you are using it correctly. ? Make sure it is working right.  Make sure you understand what your blood pressure numbers should be.  Tell your doctor if your medicines are causing side effects. Contact a doctor if:  Your blood pressure keeps being  high. Get help right away if:  Your first blood pressure number is higher than 180.  Your second blood pressure number is higher than 120. This information is not intended to replace advice given to you by your health care provider. Make sure you discuss any questions you have with your health care provider. Document Released: 11/20/2008 Document Revised: 11/20/2017 Document Reviewed: 05/16/2016 Elsevier Patient Education  2020 [employer].

## 2019-08-17 NOTE — Progress Notes (Signed)
[place] 57+4 wks,cephalic,cx 3.4 cm,lateral placenta gr 1,normal ovaries bilat,afi 13.6 cm,fhr 140 bpm,efw 1159 g 32%

## 2019-08-17 NOTE — Progress Notes (Signed)
PRENATAL VISIT NOTE  Subjective:  Alicia Adkins is a 23 y.o. G1P0000 at 1558w1d being seen today for ongoing prenatal care.  She is currently monitored for the following issues for this high-risk pregnancy and has Depression; High-risk pregnancy; Chlamydia; Hypertension; and Chronic hypertension with superimposed pre-eclampsia on their problem list.  Patient reports that she is taking her baby ASA. She denies blurry vision, floating spots, headache, RUQ pain. .  Contractions: Not present. Vag. Bleeding: None.  Movement: Present. Denies leaking of fluid.   The following portions of the patient's history were reviewed and updated as appropriate: allergies, current medications, past family history, past medical history, past social history, past surgical history and problem list.   Objective:   Vitals:   08/17/19 1033  BP: (!) 147/81  Pulse: (!) 109  Weight: 247 lb (112 kg)    Fetal Status:     Movement: Present      General:  Alert, oriented and cooperative. Patient is in no acute distress.  Skin: Skin is warm and dry. No rash noted.   Cardiovascular: Normal heart rate noted  Respiratory: Normal respiratory effort, no problems with respiration noted  Abdomen: Soft, gravid, appropriate for gestational age.  Pain/Pressure: Absent     Pelvic: Cervical exam deferred        Extremities: Normal range of motion.  Edema: Trace  Mental Status: Normal mood and affect. Normal behavior. Normal judgment and thought content.   Assessment and Plan:  Pregnancy: G1P0000 at 8958w1d 1. High-risk pregnancy in second trimester -Now with CHTN with SIPE; needs weekly testing and delivery at 37 weeks.   2. Essential hypertension Not on medication, no severe features.  AFI is 13; growth in 32%.   3. Chronic hypertension with superimposed pre-eclampsia -Reviewed labs collected on Monday; she now has 500 grams of protien in her urine. Meets critiera CHTN with SIPE without severe features.  Reviewed with Dr. Emelda FearFerguson, who recommends NST today. Patient had growth US and had normal AFI.  -Reviewed in detail with patient the warning signs for pre-e and [facility] Reviewed importance of coming to every visit as we want to monitor her and baby closely.  -NST reassuring with baseline 145/mod var, present acel, no decels, no contractions.   Preterm labor symptoms and general obstetric precautions including but not limited to vaginal bleeding, contractions, leaking of fluid and fetal movement were reviewed in detail with the patient. Please refer to After Visit Summary for other counseling recommendations.   Return in about 1 week (around 08/24/2019), or weekly BPPs and visit with provider with in person now weekly.  Future Appointments  Date Time Provider Department Center  [facility] None  08/25/2019 11:10 AM Tilda BurrowFerguson, John V, MD [facility]TO[facility]YN  09/01/2019 11:00 AM [facility] - [facility] US [facility] None  09/01/2019 11:50 AM Lazaro ArmsEure, Luther H, MD [facility] FTOBGYN  09/08/2019 11:00 AM [facility] - [facility] US [facility] None  09/08/2019 11:50 AM Federico FlakeNewton, Kimberly Niles, MD [facility] FTOBGYN  09/15/2019 10:00 AM [facility] US [facility] None  09/15/2019 10:30 AM Tilda BurrowFerguson, John V, MD [facility] FTOBGYN  09/22/2019 10:30 AM [facility] US [facility] None  09/22/2019 11:30 AM Jacklyn Shellresenzo-Dishmon, Frances, CNM [facility] FTOBGYN  09/29/2019 10:30 AM [facility] - [facility]YN US [facility] None  09/29/2019 11:10 AM Hermina StaggersErvin, Michael L, MD [facility] FTOBGYN  10/06/2019 10:30 AM [facility] US [facility] None  10/06/2019 11:30 AM Eure, Amaryllis DykeLuther H, MD [facility]  [place], CNM

## 2019-08-24 ENCOUNTER — Other Ambulatory Visit: Payer: Self-pay | Admitting: Student

## 2019-08-24 DIAGNOSIS — O10919 Unspecified pre-existing hypertension complicating pregnancy, unspecified trimester: Secondary | ICD-10-CM

## 2019-08-25 ENCOUNTER — Other Ambulatory Visit: Payer: Self-pay | Admitting: Student

## 2019-08-25 ENCOUNTER — Ambulatory Visit (INDEPENDENT_AMBULATORY_CARE_PROVIDER_SITE_OTHER): Payer: BC Managed Care – PPO | Admitting: Obstetrics and Gynecology

## 2019-08-25 ENCOUNTER — Other Ambulatory Visit: Payer: Self-pay

## 2019-08-25 ENCOUNTER — Ambulatory Visit (INDEPENDENT_AMBULATORY_CARE_PROVIDER_SITE_OTHER): Payer: BC Managed Care – PPO

## 2019-08-25 VITALS — BP 142/79 | HR 109 | Wt 248.0 lb

## 2019-08-25 DIAGNOSIS — O10919 Unspecified pre-existing hypertension complicating pregnancy, unspecified trimester: Secondary | ICD-10-CM

## 2019-08-25 DIAGNOSIS — Z3A29 29 weeks gestation of pregnancy: Secondary | ICD-10-CM

## 2019-08-25 DIAGNOSIS — O0992 Supervision of high risk pregnancy, unspecified, second trimester: Secondary | ICD-10-CM

## 2019-08-25 DIAGNOSIS — Z1389 Encounter for screening for other disorder: Secondary | ICD-10-CM

## 2019-08-25 DIAGNOSIS — O119 Pre-existing hypertension with pre-eclampsia, unspecified trimester: Secondary | ICD-10-CM

## 2019-08-25 DIAGNOSIS — O10913 Unspecified pre-existing hypertension complicating pregnancy, third trimester: Secondary | ICD-10-CM

## 2019-08-25 DIAGNOSIS — Z331 Pregnant state, incidental: Secondary | ICD-10-CM

## 2019-08-25 LAB — POCT URINALYSIS DIPSTICK OB
Blood, UA: NEGATIVE
Glucose, UA: NEGATIVE
Ketones, UA: NEGATIVE
Leukocytes, UA: NEGATIVE
Nitrite, UA: NEGATIVE
POC,PROTEIN,UA: NEGATIVE

## 2019-08-25 NOTE — Progress Notes (Addendum)
[place] 37+4 wks,cephalic, right lateral placenta gr 0,afi 15 cm,normal ovaries bilat,RI .62,.64=41% S/D 36%,BPP 8/8,FHR 152 BPM

## 2019-08-25 NOTE — Progress Notes (Signed)
Patient ID: ALIZEH MADRIL, female   DOB: 05-16-1996, 23 y.o.   MRN: [id number]     [facility] PREGNANCY VISIT Patient name: Alicia Adkins MRN [id number]  Date of birth: 01-16-1996 Chief Complaint:   Routine Prenatal Visit  History of Present Illness:   Alicia Adkins is a 23 y.o. G77P0000 female at [redacted]w[redacted]d with an Estimated Date of Delivery: 11/08/19 being seen today for ongoing management of a high-risk pregnancy complicated by [facility] not currently on bp meds,with superimposed pre-eclampsia, based on urine TP of 500 mg/d at 8/24.  Today she reports weekly mild headaches chronically noted once a week on average.. She did a 24 hr total protein 8/24, but did not collect urine for the full 24 hrs. Denies vision problems. She has never been on BP medication.  Contractions: Not present. Vag. Bleeding: None.  Movement: Present. denies leaking of fluid.  Review of Systems:   Pertinent items are noted in HPI Denies abnormal vaginal discharge w/ itching/odor/irritation, headaches, visual changes, shortness of breath, chest pain, abdominal pain, severe nausea/vomiting, or problems with urination or bowel movements unless otherwise stated above. Pertinent History Reviewed:  Reviewed past medical,surgical, social, obstetrical and family history.  Reviewed problem list, medications and allergies. Physical Assessment:   Vitals:   08/25/19 1122  BP: (!) 142/79  Pulse: (!) 109  Weight: 248 lb (112.5 kg)  Body mass index is 43.24 kg/m.           Physical Examination:   General appearance: alert, well appearing, and in no distress and oriented to person, place, and time  Mental status: alert, oriented to person, place, and time, normal mood, behavior, speech, dress, motor activity, and thought processes, affect appropriate to mood  Skin: warm & dry   Extremities: Edema: None    Cardiovascular: normal heart rate noted  Respiratory: normal respiratory effort, no distress  Abdomen: gravid,  soft, non-tender  Pelvic: Cervical exam deferred         Fetal Status: Fetal Heart Rate (bpm): 152 u/s Fundal Height: 30 cm Movement: Present    Fetal Surveillance Testing today: [place] 97+3 wks,cephalic, posterior placenta gr 0,afi 15 cm,normal ovaries bilat,RI .62,.64=41% S/D 36%,BPP 8/8,FHR 152 BPM  Results for orders placed or performed in visit on 08/25/19 (from the past 24 hour(s))  POC Urinalysis Dipstick OB   Collection Time: 08/25/19 11:26 AM  Result Value Ref Range   Color, UA     Clarity, UA     Glucose, UA Negative Negative   Bilirubin, UA     Ketones, UA neg    Spec Grav, UA     Blood, UA neg    pH, UA     POC,PROTEIN,UA Negative Negative, Trace, Small (1+), Moderate (2+), Large (3+), 4+   Urobilinogen, UA     Nitrite, UA neg    Leukocytes, UA Negative Negative   Appearance     Odor      Assessment & Plan:  1) High-risk pregnancy G1P0000 at [redacted]w[redacted]d with an Estimated Date of Delivery: 11/08/19   2) CHTN, with Superimposed Pre-E based on proteinuria 500 mg/d stable without medication  Meds: No orders of the defined types were placed in this encounter.  Labs/procedures today: none  Treatment Plan: 1) Repeat 24 hr total protein 2) F/U with weekly BPP, monthly growth u/s (currently 41%ile.) Follow-up: Return in about 1 week (around 09/01/2019) for HROB.  Orders Placed This Encounter  Procedures  . POC Urinalysis Dipstick OB   By signing my  name below, I, Pietro Cassismily Tufford, attest that this documentation has been prepared under the direction and in the presence of Tilda BurrowFerguson, Kristle Wesch V, MD. Electronically Signed: Pietro CassisEmily Tufford, Medical Scribe. 08/25/19. 11:45 AM.  I personally performed the services described in this documentation, which was SCRIBED in my presence. The recorded information has been reviewed and considered accurate. It has been edited as necessary during review. Tilda BurrowJohn V Shermaine Rivet, MD

## 2019-08-31 ENCOUNTER — Other Ambulatory Visit: Payer: Self-pay | Admitting: Obstetrics and Gynecology

## 2019-08-31 DIAGNOSIS — O10919 Unspecified pre-existing hypertension complicating pregnancy, unspecified trimester: Secondary | ICD-10-CM

## 2019-09-01 ENCOUNTER — Ambulatory Visit (INDEPENDENT_AMBULATORY_CARE_PROVIDER_SITE_OTHER): Payer: BC Managed Care – PPO | Admitting: Obstetrics & Gynecology

## 2019-09-01 ENCOUNTER — Encounter: Payer: Self-pay | Admitting: Obstetrics & Gynecology

## 2019-09-01 ENCOUNTER — Ambulatory Visit (INDEPENDENT_AMBULATORY_CARE_PROVIDER_SITE_OTHER): Payer: BC Managed Care – PPO

## 2019-09-01 ENCOUNTER — Other Ambulatory Visit: Payer: Self-pay

## 2019-09-01 VITALS — BP 140/87 | HR 104 | Wt 250.0 lb

## 2019-09-01 DIAGNOSIS — Z3A30 30 weeks gestation of pregnancy: Secondary | ICD-10-CM

## 2019-09-01 DIAGNOSIS — O10919 Unspecified pre-existing hypertension complicating pregnancy, unspecified trimester: Secondary | ICD-10-CM

## 2019-09-01 DIAGNOSIS — Z331 Pregnant state, incidental: Secondary | ICD-10-CM

## 2019-09-01 DIAGNOSIS — O119 Pre-existing hypertension with pre-eclampsia, unspecified trimester: Secondary | ICD-10-CM

## 2019-09-01 DIAGNOSIS — O0993 Supervision of high risk pregnancy, unspecified, third trimester: Secondary | ICD-10-CM

## 2019-09-01 DIAGNOSIS — O10913 Unspecified pre-existing hypertension complicating pregnancy, third trimester: Secondary | ICD-10-CM

## 2019-09-01 DIAGNOSIS — O113 Pre-existing hypertension with pre-eclampsia, third trimester: Secondary | ICD-10-CM

## 2019-09-01 DIAGNOSIS — O0992 Supervision of high risk pregnancy, unspecified, second trimester: Secondary | ICD-10-CM

## 2019-09-01 DIAGNOSIS — Z1389 Encounter for screening for other disorder: Secondary | ICD-10-CM

## 2019-09-01 LAB — POCT URINALYSIS DIPSTICK OB
Blood, UA: NEGATIVE
Glucose, UA: NEGATIVE
Ketones, UA: NEGATIVE
Leukocytes, UA: NEGATIVE
Nitrite, UA: NEGATIVE
POC,PROTEIN,UA: NEGATIVE

## 2019-09-01 NOTE — Progress Notes (Signed)
   [facility] PREGNANCY VISIT Patient name: Alicia Adkins MRN [id number]  Date of birth: 06/20/1996 Chief Complaint:   High Risk Gestation (u/s)  History of Present Illness:   Alicia Adkins is a 23 y.o. G20P0000 female at [redacted]w[redacted]d with an Estimated Date of Delivery: 11/08/19 being seen today for ongoing management of a high-risk pregnancy complicated by Chronic hypertension, with super imposed pre eclampsia Today she reports no complaints. Contractions: Not present. Vag. Bleeding: None.  Movement: Present. denies leaking of fluid.  Review of Systems:   Pertinent items are noted in HPI Denies abnormal vaginal discharge w/ itching/odor/irritation, headaches, visual changes, shortness of breath, chest pain, abdominal pain, severe nausea/vomiting, or problems with urination or bowel movements unless otherwise stated above. Pertinent History Reviewed:  Reviewed past medical,surgical, social, obstetrical and family history.  Reviewed problem list, medications and allergies. Physical Assessment:   Vitals:   09/01/19 1142  BP: 140/87  Pulse: (!) 104  Weight: 250 lb (113.4 kg)  Body mass index is 43.59 kg/m.           Physical Examination:   General appearance: alert, well appearing, and in no distress  Mental status: alert, oriented to person, place, and time  Skin: warm & dry   Extremities: Edema: None    Cardiovascular: normal heart rate noted  Respiratory: normal respiratory effort, no distress  Abdomen: gravid, soft, non-tender  Pelvic: Cervical exam deferred         Fetal Status: Fetal Heart Rate (bpm): 138 Fundal Height: 32 cm Movement: Present    Fetal Surveillance Testing today: BPP 8/8   Results for orders placed or performed in visit on 09/01/19 (from the past 24 hour(s))  POC Urinalysis Dipstick OB   Collection Time: 09/01/19 11:49 AM  Result Value Ref Range   Color, UA     Clarity, UA     Glucose, UA Negative Negative   Bilirubin, UA     Ketones, UA neg    Spec Grav, UA     Blood, UA neg    pH, UA     POC,PROTEIN,UA Negative Negative, Trace, Small (1+), Moderate (2+), Large (3+), 4+   Urobilinogen, UA     Nitrite, UA neg    Leukocytes, UA Negative Negative   Appearance     Odor      Assessment & Plan:  1) High-risk pregnancy G1P0000 at [redacted]w[redacted]d with an Estimated Date of Delivery: 11/08/19   2) CHTN with SIPE, stable, ressuring testing and BP    Meds: No orders of the defined types were placed in this encounter.   Labs/procedures today: sonogram BPP 8/8  Treatment Plan:  Weekly BPP til 32 weeks then twice weekly surveillance  Reviewed: Preterm labor symptoms and general obstetric precautions including but not limited to vaginal bleeding, contractions, leaking of fluid and fetal movement were reviewed in detail with the patient.  All questions were answered. Has home bp cuff..    Follow-up: Return in about 1 week (around 09/08/2019) for keep scheduled appointments sonogram plus office visit.  Orders Placed This Encounter  Procedures  . POC Urinalysis Dipstick OB   Mertie Clause Eure  09/01/2019 11:57 AM

## 2019-09-01 NOTE — Progress Notes (Signed)
[place] 74+8 wks,cephalic,BPP 2/7,MB 3.8 cm,posterior placenta gr 1,afi 16.5 cm,fhr 138 bpm,RI .63,.62,.69,.68=62%

## 2019-09-03 LAB — PROTEIN, URINE, 24 HOUR
Protein, 24H Urine: 431 mg/24 hr — ABNORMAL HIGH (ref 30–150)
Protein, Ur: 16.9 mg/dL

## 2019-09-07 ENCOUNTER — Other Ambulatory Visit: Payer: Self-pay | Admitting: Obstetrics & Gynecology

## 2019-09-07 DIAGNOSIS — O10919 Unspecified pre-existing hypertension complicating pregnancy, unspecified trimester: Secondary | ICD-10-CM

## 2019-09-08 ENCOUNTER — Ambulatory Visit (INDEPENDENT_AMBULATORY_CARE_PROVIDER_SITE_OTHER): Payer: BC Managed Care – PPO | Admitting: Family Medicine

## 2019-09-08 ENCOUNTER — Ambulatory Visit (INDEPENDENT_AMBULATORY_CARE_PROVIDER_SITE_OTHER): Payer: BC Managed Care – PPO

## 2019-09-08 ENCOUNTER — Encounter: Payer: Self-pay | Admitting: Family Medicine

## 2019-09-08 ENCOUNTER — Other Ambulatory Visit: Payer: Self-pay

## 2019-09-08 VITALS — BP 124/79 | HR 99 | Wt 250.8 lb

## 2019-09-08 DIAGNOSIS — O10913 Unspecified pre-existing hypertension complicating pregnancy, third trimester: Secondary | ICD-10-CM

## 2019-09-08 DIAGNOSIS — I10 Essential (primary) hypertension: Secondary | ICD-10-CM

## 2019-09-08 DIAGNOSIS — Z3A31 31 weeks gestation of pregnancy: Secondary | ICD-10-CM | POA: Diagnosis not present

## 2019-09-08 DIAGNOSIS — O0993 Supervision of high risk pregnancy, unspecified, third trimester: Secondary | ICD-10-CM

## 2019-09-08 DIAGNOSIS — O98819 Other maternal infectious and parasitic diseases complicating pregnancy, unspecified trimester: Secondary | ICD-10-CM | POA: Insufficient documentation

## 2019-09-08 DIAGNOSIS — O119 Pre-existing hypertension with pre-eclampsia, unspecified trimester: Secondary | ICD-10-CM

## 2019-09-08 DIAGNOSIS — O10919 Unspecified pre-existing hypertension complicating pregnancy, unspecified trimester: Secondary | ICD-10-CM

## 2019-09-08 DIAGNOSIS — O113 Pre-existing hypertension with pre-eclampsia, third trimester: Secondary | ICD-10-CM

## 2019-09-08 DIAGNOSIS — A749 Chlamydial infection, unspecified: Secondary | ICD-10-CM

## 2019-09-08 DIAGNOSIS — O98813 Other maternal infectious and parasitic diseases complicating pregnancy, third trimester: Secondary | ICD-10-CM

## 2019-09-08 DIAGNOSIS — O0992 Supervision of high risk pregnancy, unspecified, second trimester: Secondary | ICD-10-CM

## 2019-09-08 NOTE — Progress Notes (Signed)
[place] 57+5 wks,cephalic,BPP 0/5,XGZFPOIPP pl gr 2,afi 13 cm,RI .61,.57,.68,.71=69%,FHR 148 BPM

## 2019-09-08 NOTE — Progress Notes (Signed)
   [facility] PREGNANCY VISIT Patient name: Alicia Adkins MRN [id number]  Date of birth: 1996/01/16 Chief Complaint:   Routine Prenatal Visit  History of Present Illness:   Alicia Adkins is a 23 y.o. G44P0000 female at [redacted]w[redacted]d with an Estimated Date of Delivery: 11/08/19 being seen today for ongoing management of a high-risk pregnancy complicated by [facility] currently on no medication with SIPE Today she reports no complaints. Contractions: Not present. Vag. Bleeding: None.  Movement: Present. denies leaking of fluid.  Review of Systems:   Pertinent items are noted in HPI Denies abnormal vaginal discharge w/ itching/odor/irritation, headaches, visual changes, shortness of breath, chest pain, abdominal pain, severe nausea/vomiting, or problems with urination or bowel movements unless otherwise stated above. Pertinent History Reviewed:  Reviewed past medical,surgical, social, obstetrical and family history.  Reviewed problem list, medications and allergies. Physical Assessment:   Vitals:   09/08/19 1144  BP: 124/79  Pulse: 99  Weight: 250 lb 12.8 oz (113.8 kg)  Body mass index is 43.73 kg/m.           Physical Examination:   General appearance: alert, well appearing, and in no distress  Mental status: alert, oriented to person, place, and time  Skin: warm & dry   Extremities: Edema: None    Cardiovascular: normal heart rate noted  Respiratory: normal respiratory effort, no distress  Abdomen: gravid, soft, non-tender  Pelvic: Cervical exam deferred         Fetal Status:     Movement: Present    Fetal Surveillance Testing today:  US/BPP   No results found for this or any previous visit (from the past 24 hour(s)).  Assessment & Plan:  1) High-risk pregnancy G1P0000 at [redacted]w[redacted]d with an Estimated Date of Delivery: 11/08/19   2) cHTN with SIPE, stable: reassuring testing and BP today. Dx based on worsening proteinuria and BPs in mild range.   3) Chlamydia: repeat naat at 36 wks   Meds: No orders of the defined types were placed in this encounter.   Labs/procedures today: [place] with UA dopplers- US 02+7 wks,cephalic,BPP 2/5,[id number] pl gr 2,afi 13 cm,RI .61,.57,.68,.71=69%,FHR 148 BPM  Treatment Plan:  Continue weekly BPPs with UA dopplers. Plan for IOL at 37 weeks based on dx of SIPE  Reviewed: Preterm labor symptoms and general obstetric precautions including but not limited to vaginal bleeding, contractions, leaking of fluid and fetal movement were reviewed in detail with the patient.  All questions were answered.   Follow-up: Return for Scheduled prenatal/NST.  No orders of the defined types were placed in this encounter.  Future Appointments  Date Time Provider [place]  09/15/2019 10:00 AM [facility] [place] [facility] None  09/15/2019 10:30 AM Jonnie Kind, MD [facility]YN  09/22/2019 10:30 AM [facility] - [facility] [place] [facility] None  09/22/2019 11:30 AM Christin Fudge, CNM [facility]TO[facility]YN  09/29/2019 10:30 AM [facility] [place] [facility] None  09/29/2019 11:50 AM Chancy Milroy, MD [facility] F[facility]YN  10/06/2019 10:30 AM [facility] [place] [facility] None  10/06/2019 11:30 AM Florian Buff, MD [facility] F[facility]YN    Juanita Craver [facility]  09/08/2019 4:43 PM

## 2019-09-14 ENCOUNTER — Other Ambulatory Visit: Payer: Self-pay | Admitting: Family Medicine

## 2019-09-14 DIAGNOSIS — O10919 Unspecified pre-existing hypertension complicating pregnancy, unspecified trimester: Secondary | ICD-10-CM

## 2019-09-15 ENCOUNTER — Other Ambulatory Visit: Payer: Self-pay

## 2019-09-15 ENCOUNTER — Encounter: Payer: Self-pay | Admitting: Obstetrics and Gynecology

## 2019-09-15 ENCOUNTER — Ambulatory Visit (INDEPENDENT_AMBULATORY_CARE_PROVIDER_SITE_OTHER): Payer: BC Managed Care – PPO

## 2019-09-15 ENCOUNTER — Ambulatory Visit (INDEPENDENT_AMBULATORY_CARE_PROVIDER_SITE_OTHER): Payer: BC Managed Care – PPO | Admitting: Obstetrics and Gynecology

## 2019-09-15 VITALS — BP 130/60 | HR 105 | Wt 251.0 lb

## 2019-09-15 DIAGNOSIS — Z3A32 32 weeks gestation of pregnancy: Secondary | ICD-10-CM

## 2019-09-15 DIAGNOSIS — O10913 Unspecified pre-existing hypertension complicating pregnancy, third trimester: Secondary | ICD-10-CM

## 2019-09-15 DIAGNOSIS — O0993 Supervision of high risk pregnancy, unspecified, third trimester: Secondary | ICD-10-CM

## 2019-09-15 DIAGNOSIS — I10 Essential (primary) hypertension: Secondary | ICD-10-CM

## 2019-09-15 DIAGNOSIS — O113 Pre-existing hypertension with pre-eclampsia, third trimester: Secondary | ICD-10-CM

## 2019-09-15 DIAGNOSIS — O10919 Unspecified pre-existing hypertension complicating pregnancy, unspecified trimester: Secondary | ICD-10-CM

## 2019-09-15 DIAGNOSIS — A749 Chlamydial infection, unspecified: Secondary | ICD-10-CM

## 2019-09-15 DIAGNOSIS — Z331 Pregnant state, incidental: Secondary | ICD-10-CM

## 2019-09-15 DIAGNOSIS — Z1389 Encounter for screening for other disorder: Secondary | ICD-10-CM

## 2019-09-15 DIAGNOSIS — O119 Pre-existing hypertension with pre-eclampsia, unspecified trimester: Secondary | ICD-10-CM

## 2019-09-15 LAB — POCT URINALYSIS DIPSTICK OB
Blood, UA: NEGATIVE
Leukocytes, UA: NEGATIVE
Nitrite, UA: NEGATIVE
POC,PROTEIN,UA: NEGATIVE

## 2019-09-15 NOTE — Progress Notes (Signed)
Patient ID: CHRISTEEN LAI, female   DOB: [birth year], 23 y.o.   MRN: [id number]    [facility] PREGNANCY VISIT Patient name: JUNIA NYGREN MRN [id number]  Date of birth: 02-Aug-1996 Chief Complaint:   High Risk Gestation (u/s)  History of Present Illness:   BRITTANI PURDUM is a 23 y.o. G53P0000 female at [redacted]w[redacted]d with an Estimated Date of Delivery: 11/08/19 being seen today for ongoing management of a high-risk pregnancy complicated by [facility] currently on no medication with SIPE(proteinuria ) Today she reports no complaints. Denies headaches, spotting, and blurry vision.  Contractions: Not present.  .  Movement: Present. denies leaking of fluid.  Review of Systems:   Pertinent items are noted in HPI Denies abnormal vaginal discharge w/ itching/odor/irritation, headaches, visual changes, shortness of breath, chest pain, abdominal pain, severe nausea/vomiting, or problems with urination or bowel movements unless otherwise stated above. Pertinent History Reviewed:  Reviewed past medical,surgical, social, obstetrical and family history.  Reviewed problem list, medications and allergies. Physical Assessment:   Vitals:   09/15/19 1000  Weight: 251 lb (113.9 kg)  Body mass index is 43.77 kg/m.     BP 130/60    Pulse (!) 105    Wt 251 lb (113.9 kg)    LMP 02/01/2019    BMI 43.77 kg/m        Physical Examination:   General appearance: alert, well appearing, and in no distress and oriented to person, place, and time  Mental status: alert, oriented to person, place, and time, normal mood, behavior, speech, dress, motor activity, and thought processes, affect appropriate to mood  Skin: warm & dry   Extremities: Edema: None    Cardiovascular: normal heart rate noted  Respiratory: normal respiratory effort, no distress  Abdomen: gravid, soft, non-tender34 cm  Pelvic: Cervical exam deferred         Fetal Status:     Movement: Present    Fetal Surveillance Testing today: [place] 02+7  wks,cephalic,right lateral placenta gr 2,afi 12 cm,normal ovaries bilat,RI .58,.63,.71=84% S/D 3.26=81%,EFW 1867 g 29%,BPP 8/8  No results found for this or any previous visit (from the past 24 hour(s)).  Assessment & Plan:  1) High-risk pregnancy G1P0000 at [redacted]w[redacted]d with an Estimated Date of Delivery: 11/08/19   2) cHTN with SIPE, stable: reassuring testing and BP today. Dx based on worsening proteinuria and BPs in mild range.   3) Chlamydia: repeat naat at 36 wks  Meds: No orders of the defined types were placed in this encounter.  Labs/procedures today: none  Treatment Plan: Twice weekly surveillance from now on. Plan for IOL at 37 weeks based on dx of SIPE  Reviewed: Preterm labor symptoms and general obstetric precautions including but not limited to vaginal bleeding, contractions, leaking of fluid and fetal movement were reviewed in detail with the patient.  All questions were answered. Check bp weekly, let us know if >140/90.   Follow-up: twice weekly testing for NST/ BPP on Mon Thurs.                    IOL currently planned for 37 wk or as clinically indicated.  No orders of the defined types were placed in this encounter.  By signing my name below, I, De Burrs, attest that this documentation has been prepared under the direction and in the presence of Jonnie Kind, MD. Electronically Signed: De Burrs, Medical Scribe. 09/15/19. 10:04 AM.  I personally performed the services described in this documentation, which was SCRIBED  in my presence. The recorded information has been reviewed and considered accurate. It has been edited as necessary during review. Tilda Burrow, MD

## 2019-09-15 NOTE — Progress Notes (Signed)
[place] 76+1 wks,cephalic,right lateral placenta gr 2,afi 12 cm,normal ovaries bilat,RI .58,.63,.71=84% S/D 3.26=81%,EFW 1867 g 29%,BPP 8/8

## 2019-09-19 ENCOUNTER — Other Ambulatory Visit: Payer: Self-pay

## 2019-09-19 ENCOUNTER — Ambulatory Visit (INDEPENDENT_AMBULATORY_CARE_PROVIDER_SITE_OTHER): Payer: BC Managed Care – PPO | Admitting: *Deleted

## 2019-09-19 VITALS — BP 126/83 | HR 106 | Wt 251.0 lb

## 2019-09-19 DIAGNOSIS — O0993 Supervision of high risk pregnancy, unspecified, third trimester: Secondary | ICD-10-CM

## 2019-09-19 DIAGNOSIS — Z1389 Encounter for screening for other disorder: Secondary | ICD-10-CM

## 2019-09-19 DIAGNOSIS — A749 Chlamydial infection, unspecified: Secondary | ICD-10-CM

## 2019-09-19 DIAGNOSIS — I10 Essential (primary) hypertension: Secondary | ICD-10-CM

## 2019-09-19 DIAGNOSIS — O98819 Other maternal infectious and parasitic diseases complicating pregnancy, unspecified trimester: Secondary | ICD-10-CM

## 2019-09-19 DIAGNOSIS — O119 Pre-existing hypertension with pre-eclampsia, unspecified trimester: Secondary | ICD-10-CM

## 2019-09-19 DIAGNOSIS — Z331 Pregnant state, incidental: Secondary | ICD-10-CM

## 2019-09-19 LAB — POCT URINALYSIS DIPSTICK OB
Blood, UA: NEGATIVE
Glucose, UA: NEGATIVE
Ketones, UA: NEGATIVE
Leukocytes, UA: NEGATIVE
Nitrite, UA: NEGATIVE
POC,PROTEIN,UA: NEGATIVE

## 2019-09-19 NOTE — Progress Notes (Signed)
   NURSE VISIT- NST  SUBJECTIVE:  Alicia Adkins is a 23 y.o. G1P0000 female at [redacted]w[redacted]d, here for a NST for pregnancy complicated by [facility].  She reports active fetal movement, contractions: none, vaginal bleeding: none, membranes: intact.   OBJECTIVE:  LMP 02/01/2019   Appears well, no apparent distress  No results found for this or any previous visit (from the past 24 hour(s)).  NST: FHR baseline 140 bpm, Variability: moderate, Accelerations:present, Decelerations:  Absent= Cat 1 /Reactive Toco: none   ASSESSMENT: G1P0000 at [redacted]w[redacted]d with CHTN NST reactive  PLAN: EFM strip reviewed by Knute Neu, CNM, [facility]   Recommendations: keep next appointment as scheduled    Levy Pupa  09/19/2019 11:46 AM

## 2019-09-21 ENCOUNTER — Other Ambulatory Visit: Payer: Self-pay | Admitting: Obstetrics and Gynecology

## 2019-09-21 DIAGNOSIS — O10919 Unspecified pre-existing hypertension complicating pregnancy, unspecified trimester: Secondary | ICD-10-CM

## 2019-09-22 ENCOUNTER — Ambulatory Visit (INDEPENDENT_AMBULATORY_CARE_PROVIDER_SITE_OTHER): Payer: BC Managed Care – PPO

## 2019-09-22 ENCOUNTER — Encounter: Payer: Self-pay | Admitting: Advanced Practice Midwife

## 2019-09-22 ENCOUNTER — Other Ambulatory Visit: Payer: Self-pay

## 2019-09-22 ENCOUNTER — Ambulatory Visit (INDEPENDENT_AMBULATORY_CARE_PROVIDER_SITE_OTHER): Payer: BC Managed Care – PPO | Admitting: Advanced Practice Midwife

## 2019-09-22 VITALS — BP 136/80 | HR 108 | Wt 251.0 lb

## 2019-09-22 DIAGNOSIS — O98313 Other infections with a predominantly sexual mode of transmission complicating pregnancy, third trimester: Secondary | ICD-10-CM

## 2019-09-22 DIAGNOSIS — Z3A33 33 weeks gestation of pregnancy: Secondary | ICD-10-CM

## 2019-09-22 DIAGNOSIS — O119 Pre-existing hypertension with pre-eclampsia, unspecified trimester: Secondary | ICD-10-CM

## 2019-09-22 DIAGNOSIS — Z331 Pregnant state, incidental: Secondary | ICD-10-CM

## 2019-09-22 DIAGNOSIS — O10919 Unspecified pre-existing hypertension complicating pregnancy, unspecified trimester: Secondary | ICD-10-CM

## 2019-09-22 DIAGNOSIS — O10913 Unspecified pre-existing hypertension complicating pregnancy, third trimester: Secondary | ICD-10-CM

## 2019-09-22 DIAGNOSIS — O0993 Supervision of high risk pregnancy, unspecified, third trimester: Secondary | ICD-10-CM

## 2019-09-22 DIAGNOSIS — O113 Pre-existing hypertension with pre-eclampsia, third trimester: Secondary | ICD-10-CM

## 2019-09-22 DIAGNOSIS — Z1389 Encounter for screening for other disorder: Secondary | ICD-10-CM

## 2019-09-22 DIAGNOSIS — Z23 Encounter for immunization: Secondary | ICD-10-CM

## 2019-09-22 DIAGNOSIS — O0990 Supervision of high risk pregnancy, unspecified, unspecified trimester: Secondary | ICD-10-CM

## 2019-09-22 DIAGNOSIS — A749 Chlamydial infection, unspecified: Secondary | ICD-10-CM

## 2019-09-22 LAB — POCT URINALYSIS DIPSTICK OB
Blood, UA: NEGATIVE
Glucose, UA: NEGATIVE
Leukocytes, UA: NEGATIVE
Nitrite, UA: NEGATIVE
POC,PROTEIN,UA: NEGATIVE

## 2019-09-22 NOTE — Progress Notes (Signed)
[facility] PREGNANCY VISIT Patient name: Alicia Adkins MRN [id number]  Date of birth: [birth year] Chief Complaint:   High Risk Gestation (u/s-tdap/ flu shot)  History of Present Illness:   Alicia Adkins is a 23 y.o. G40P0000 female at [redacted]w[redacted]d with an Estimated Date of Delivery: 11/08/19 being seen today for ongoing management of a high-risk pregnancy complicated by [facility] currently on no meds other than ASA Today she reports no complaints. Contractions: Irregular.  .  Movement: Present. denies leaking of fluid.  Review of Systems:   Pertinent items are noted in HPI Denies abnormal vaginal discharge w/ itching/odor/irritation, headaches, visual changes, shortness of breath, chest pain, abdominal pain, severe nausea/vomiting, or problems with urination or bowel movements unless otherwise stated above. Pertinent History Reviewed:  Reviewed past medical,surgical, social, obstetrical and family history.  Reviewed problem list, medications and allergies. Review of chart regarding previous dx of CHTN w/SIPE based on proteinuria:  Pt dx w/CHTN based on chart review at 27 weeks. Baseline 24 hr protein 502mg  that day w/neg urine dips.  Repeat 24 hours urine at 30 weeks 431.  BPs stable.  Review w/Dr .Elonda Husky:  Pt is CHTN w/baseline proteinuria. Continue twice weekly testing, IOL at 39 weeks for now.     Physical Assessment:   Vitals:   09/22/19 1116  BP: 136/80  Pulse: (!) 108  Weight: 251 lb (113.9 kg)  Body mass index is 43.77 kg/m.           Physical Examination:   General appearance: alert, well appearing, and in no distress  Mental status: alert, oriented to person, place, and time  Skin: warm & dry   Extremities: Edema: Trace    Cardiovascular: normal heart rate noted  Respiratory: normal respiratory effort, no distress  Abdomen: gravid, soft, non-tender  Pelvic: Cervical exam deferred         Fetal Status:     Movement: Present    Fetal Surveillance Testing today: [place] 96+7  wks,cephalic,right lateral placenta gr 2,AFI 16 cm,BPP 8/8,normal ovaries bilat,RI .76,.61,.67=83%   Results for orders placed or performed in visit on 09/22/19 (from the past 24 hour(s))  POC Urinalysis Dipstick OB   Collection Time: 09/22/19 11:15 AM  Result Value Ref Range   Color, UA     Clarity, UA     Glucose, UA Negative Negative   Bilirubin, UA     Ketones, UA small    Spec Grav, UA     Blood, UA neg    pH, UA     POC,PROTEIN,UA Negative Negative, Trace, Small (1+), Moderate (2+), Large (3+), 4+   Urobilinogen, UA     Nitrite, UA neg    Leukocytes, UA Negative Negative   Appearance     Odor      Assessment & Plan:  1) High-risk pregnancy G1P0000 at [redacted]w[redacted]d with an Estimated Date of Delivery: 11/08/19   2) CHTN, stable  3) intermittent proteinuria , stable  Meds: No orders of the defined types were placed in this encounter.   Labs/procedures today: Tdap/flu shot  Treatment Plan:  Twice weekly tesing, IOL 39 weeks or as clinically indicated  Reviewed: Preterm labor symptoms and general obstetric precautions including but not limited to vaginal bleeding, contractions, leaking of fluid and fetal movement were reviewed in detail with the patient.  All questions were answered. has home bp cuff.. Check bp weekly, let us know if >140/90.   Follow-up: Return for Mondays for RN NST visits.  Future Appointments  Date  Time Provider Department Center  09/29/2019 10:30 AM [facility] - [facility] [place] [facility] None  09/29/2019 11:50 AM Hermina Staggers, MD [facility]YN  10/06/2019 10:30 AM [facility] [place] [facility] None  10/06/2019 11:30 AM Eure, Amaryllis Dyke, MD [facility] F[facility]YN    Orders Placed This Encounter  Procedures  . POC Urinalysis Dipstick OB   Jacklyn Shell DNP, CNM 09/22/2019 11:58 AM

## 2019-09-22 NOTE — Patient Instructions (Signed)
Alicia Adkins, I greatly value your feedback.  If you receive a survey following your visit with [place] today, we appreciate you taking the time to fill it out.  Thanks, Nigel Berthold, DNP, CNM  [place]!!! It is now [place] at [facility] ([place], [place] [address]) Entrance located off of [address] parking   Go to [email or web address] to register for FREE online childbirth classes    Call the office [phone]) or go to [place] if:  You begin to have strong, frequent contractions  Your water breaks.  Sometimes it is a big gush of fluid, sometimes it is just a trickle that keeps getting your panties wet or running down your legs  You have vaginal bleeding.  It is normal to have a small amount of spotting if your cervix was checked.   You don't feel your baby moving like normal.  If you don't, get you something to eat and drink and lay down and focus on feeling your baby move.  You should feel at least 10 movements in 2 hours.  If you don't, you should call the office or go to [place] Blood Pressure Monitoring for Patients   Your provider has recommended that you check your blood pressure (BP) at least once a week at home. If you do not have a blood pressure cuff at home, one will be provided for you. Contact your provider if you have not received your monitor within 1 week.   Helpful Tips for Accurate Home Blood Pressure Checks  . Don't smoke, exercise, or drink caffeine 30 minutes before checking your BP . Use the restroom before checking your BP (a full bladder can raise your pressure) . Relax in a comfortable upright chair . Feet on the ground . Left arm resting comfortably on a flat surface at the level of your heart . Legs uncrossed . Back supported . Sit quietly and don't talk . Place the cuff on your bare arm . Adjust snuggly, so that only two fingertips can  fit between your skin and the top of the cuff . Check 2 readings separated by at least one minute . Keep a log of your BP readings . For a visual, please reference this diagram: http://ccnc.care/bpdiagram  Provider Name: [facility]     Phone: [phone]  Zone 1: ALL CLEAR  Continue to monitor your symptoms:  . BP reading is less than 140 (top number) or less than 90 (bottom number)  . No right upper stomach pain . No headaches or seeing spots . No feeling nauseated or throwing up . No swelling in face and hands  Zone 2: CAUTION Call your doctor's office for any of the following:  . BP reading is greater than 140 (top number) or greater than 90 (bottom number)  . Stomach pain under your ribs in the middle or right side . Headaches or seeing spots . Feeling nauseated or throwing up . Swelling in face and hands  Zone 3: EMERGENCY  Seek immediate medical care if you have any of the following:  . BP reading is greater than160 (top number) or greater than 110 (bottom number) . Severe headaches not improving with Tylenol . Serious difficulty catching your breath . Any worsening symptoms from Zone 2

## 2019-09-22 NOTE — Progress Notes (Signed)
[place] 79+0 wks,cephalic,right lateral placenta gr 2,AFI 16 cm,BPP 8/8,normal ovaries bilat,RI .76,.61,.67=83%

## 2019-09-26 ENCOUNTER — Ambulatory Visit (INDEPENDENT_AMBULATORY_CARE_PROVIDER_SITE_OTHER): Payer: BC Managed Care – PPO | Admitting: *Deleted

## 2019-09-26 ENCOUNTER — Other Ambulatory Visit: Payer: Self-pay

## 2019-09-26 VITALS — BP 136/84 | HR 106 | Wt 254.0 lb

## 2019-09-26 DIAGNOSIS — Z1389 Encounter for screening for other disorder: Secondary | ICD-10-CM

## 2019-09-26 DIAGNOSIS — O0990 Supervision of high risk pregnancy, unspecified, unspecified trimester: Secondary | ICD-10-CM

## 2019-09-26 DIAGNOSIS — A749 Chlamydial infection, unspecified: Secondary | ICD-10-CM

## 2019-09-26 DIAGNOSIS — Z331 Pregnant state, incidental: Secondary | ICD-10-CM

## 2019-09-26 DIAGNOSIS — I10 Essential (primary) hypertension: Secondary | ICD-10-CM

## 2019-09-26 LAB — POCT URINALYSIS DIPSTICK OB
Blood, UA: NEGATIVE
Glucose, UA: NEGATIVE
Ketones, UA: NEGATIVE
Leukocytes, UA: NEGATIVE
Nitrite, UA: NEGATIVE

## 2019-09-26 NOTE — Progress Notes (Signed)
   NURSE VISIT- NST  SUBJECTIVE:  Alicia Adkins is a 23 y.o. G1P0000 female at [redacted]w[redacted]d, here for a NST for pregnancy complicated by [facility].  She reports active fetal movement, contractions: none, vaginal bleeding: none, membranes: intact.   OBJECTIVE:  BP 136/84   Pulse (!) 106   Wt 254 lb (115.2 kg)   LMP 02/01/2019   BMI 44.29 kg/m   Appears well, no apparent distress  Results for orders placed or performed in visit on 09/26/19 (from the past 24 hour(s))  POC Urinalysis Dipstick OB   Collection Time: 09/26/19 11:30 AM  Result Value Ref Range   Color, UA     Clarity, UA     Glucose, UA Negative Negative   Bilirubin, UA     Ketones, UA n    Spec Grav, UA     Blood, UA n    pH, UA     POC,PROTEIN,UA Trace Negative, Trace, Small (1+), Moderate (2+), Large (3+), 4+   Urobilinogen, UA     Nitrite, UA n    Leukocytes, UA Negative Negative   Appearance     Odor      NST: FHR baseline 145 bpm, Variability: moderate, Accelerations:present, Decelerations:  Absent= Cat 1 /Reactive Toco: none   ASSESSMENT: G1P0000 at [redacted]w[redacted]d with CHTN NST reactive  PLAN: EFM strip reviewed by Nigel Berthold, CNM   Recommendations: keep next appointment as scheduled    Alicia Adkins  09/26/2019 1:38 PM

## 2019-09-28 ENCOUNTER — Other Ambulatory Visit: Payer: Self-pay | Admitting: Advanced Practice Midwife

## 2019-09-28 DIAGNOSIS — O10919 Unspecified pre-existing hypertension complicating pregnancy, unspecified trimester: Secondary | ICD-10-CM

## 2019-09-29 ENCOUNTER — Ambulatory Visit (INDEPENDENT_AMBULATORY_CARE_PROVIDER_SITE_OTHER): Payer: BC Managed Care – PPO | Admitting: Obstetrics and Gynecology

## 2019-09-29 ENCOUNTER — Ambulatory Visit (INDEPENDENT_AMBULATORY_CARE_PROVIDER_SITE_OTHER): Payer: BC Managed Care – PPO

## 2019-09-29 ENCOUNTER — Encounter: Payer: Self-pay | Admitting: Obstetrics and Gynecology

## 2019-09-29 ENCOUNTER — Other Ambulatory Visit: Payer: Self-pay

## 2019-09-29 VITALS — BP 128/85 | HR 100 | Wt 253.0 lb

## 2019-09-29 DIAGNOSIS — O0990 Supervision of high risk pregnancy, unspecified, unspecified trimester: Secondary | ICD-10-CM

## 2019-09-29 DIAGNOSIS — Z3A34 34 weeks gestation of pregnancy: Secondary | ICD-10-CM | POA: Diagnosis not present

## 2019-09-29 DIAGNOSIS — O10919 Unspecified pre-existing hypertension complicating pregnancy, unspecified trimester: Secondary | ICD-10-CM

## 2019-09-29 DIAGNOSIS — O10913 Unspecified pre-existing hypertension complicating pregnancy, third trimester: Secondary | ICD-10-CM | POA: Diagnosis not present

## 2019-09-29 DIAGNOSIS — O0993 Supervision of high risk pregnancy, unspecified, third trimester: Secondary | ICD-10-CM

## 2019-09-29 DIAGNOSIS — A749 Chlamydial infection, unspecified: Secondary | ICD-10-CM

## 2019-09-29 DIAGNOSIS — O119 Pre-existing hypertension with pre-eclampsia, unspecified trimester: Secondary | ICD-10-CM | POA: Insufficient documentation

## 2019-09-29 LAB — POCT URINALYSIS DIPSTICK OB
Blood, UA: NEGATIVE
Glucose, UA: NEGATIVE
Ketones, UA: NEGATIVE
Leukocytes, UA: NEGATIVE
Nitrite, UA: NEGATIVE
POC,PROTEIN,UA: NEGATIVE

## 2019-09-29 NOTE — Progress Notes (Signed)
Subjective:  Alicia Adkins is a 23 y.o. G1P0000 at [redacted]w[redacted]d being seen today for ongoing prenatal care.  She is currently monitored for the following issues for this high-risk pregnancy and has Depression; Supervision of high risk pregnancy, antepartum; Hypertension; Chlamydia infection affecting pregnancy, antepartum; and Chronic hypertension affecting pregnancy on their problem list.  Patient reports no complaints.  Contractions: Not present.  .  Movement: Present. Denies leaking of fluid.   The following portions of the patient's history were reviewed and updated as appropriate: allergies, current medications, past family history, past medical history, past social history, past surgical history and problem list. Problem list updated.  Objective:   Vitals:   09/29/19 1117  BP: 128/85  Pulse: 100  Weight: 253 lb (114.8 kg)    Fetal Status:     Movement: Present     General:  Alert, oriented and cooperative. Patient is in no acute distress.  Skin: Skin is warm and dry. No rash noted.   Cardiovascular: Normal heart rate noted  Respiratory: Normal respiratory effort, no problems with respiration noted  Abdomen: Soft, gravid, appropriate for gestational age. Pain/Pressure: Absent     Pelvic:  Cervical exam deferred        Extremities: Normal range of motion.  Edema: None  Mental Status: Normal mood and affect. Normal behavior. Normal judgment and thought content.   Urinalysis:      Assessment and Plan:  Pregnancy: G1P0000 at [redacted]w[redacted]d  1. Supervision of high risk pregnancy, antepartum Stable - POC Urinalysis Dipstick OB  2. Chronic hypertension affecting pregnancy BP stable without meds Continue with BASA and antenatal testing IOL 39-40 weeks - POC Urinalysis Dipstick OB  Preterm labor symptoms and general obstetric precautions including but not limited to vaginal bleeding, contractions, leaking of fluid and fetal movement were reviewed in detail with the patient. Please refer  to After Visit Summary for other counseling recommendations.  Return in about 1 week (around 10/06/2019) for OB visit, face to face. Conitnue with weekly BPP.   Chancy Milroy, MD

## 2019-09-29 NOTE — Progress Notes (Signed)
[place] 95+0 wks,cephalic,BPP 7/2,UVJDY lateral placenta gr 2,fhr 152 bpm,BPP 8/8,AFI 13.4 cm,RI .52,.48,.46,.61=28%

## 2019-09-29 NOTE — Patient Instructions (Signed)
Third Trimester of Pregnancy The third trimester is from week 28 through week 40 (months 7 through 9). The third trimester is a time when the unborn baby (fetus) is growing rapidly. At the end of the ninth month, the fetus is about 20 inches in length and weighs 6-10 pounds. Body changes during your third trimester Your body will continue to go through many changes during pregnancy. The changes vary from woman to woman. During the third trimester:  Your weight will continue to increase. You can expect to gain 25-35 pounds (11-16 kg) by the end of the pregnancy.  You may begin to get stretch marks on your hips, abdomen, and breasts.  You may urinate more often because the fetus is moving lower into your pelvis and pressing on your bladder.  You may develop or continue to have heartburn. This is caused by increased hormones that slow down muscles in the digestive tract.  You may develop or continue to have constipation because increased hormones slow digestion and cause the muscles that push waste through your intestines to relax.  You may develop hemorrhoids. These are swollen veins (varicose veins) in the rectum that can itch or be painful.  You may develop swollen, bulging veins (varicose veins) in your legs.  You may have increased body aches in the pelvis, back, or thighs. This is due to weight gain and increased hormones that are relaxing your joints.  You may have changes in your hair. These can include thickening of your hair, rapid growth, and changes in texture. Some women also have hair loss during or after pregnancy, or hair that feels dry or thin. Your hair will most likely return to normal after your baby is born.  Your breasts will continue to grow and they will continue to become tender. A yellow fluid (colostrum) may leak from your breasts. This is the first milk you are producing for your baby.  Your belly button may stick out.  You may notice more swelling in your hands,  face, or ankles.  You may have increased tingling or numbness in your hands, arms, and legs. The skin on your belly may also feel numb.  You may feel short of breath because of your expanding uterus.  You may have more problems sleeping. This can be caused by the size of your belly, increased need to urinate, and an increase in your body's metabolism.  You may notice the fetus "dropping," or moving lower in your abdomen (lightening).  You may have increased vaginal discharge.  You may notice your joints feel loose and you may have pain around your pelvic bone. What to expect at prenatal visits You will have prenatal exams every 2 weeks until week 36. Then you will have weekly prenatal exams. During a routine prenatal visit:  You will be weighed to make sure you and the baby are growing normally.  Your blood pressure will be taken.  Your abdomen will be measured to track your baby's growth.  The fetal heartbeat will be listened to.  Any test results from the previous visit will be discussed.  You may have a cervical check near your due date to see if your cervix has softened or thinned (effaced).  You will be tested for Group B streptococcus. This happens between 35 and 37 weeks. Your health care provider may ask you:  What your birth plan is.  How you are feeling.  If you are feeling the baby move.  If you have had any abnormal   symptoms, such as leaking fluid, bleeding, severe headaches, or abdominal cramping.  If you are using any tobacco products, including cigarettes, chewing tobacco, and electronic cigarettes.  If you have any questions. Other tests or screenings that may be performed during your third trimester include:  Blood tests that check for low iron levels (anemia).  Fetal testing to check the health, activity level, and growth of the fetus. Testing is done if you have certain medical conditions or if there are problems during the pregnancy.  Nonstress test  (NST). This test checks the health of your baby to make sure there are no signs of problems, such as the baby not getting enough oxygen. During this test, a belt is placed around your belly. The baby is made to move, and its heart rate is monitored during movement. What is false labor? False labor is a condition in which you feel small, irregular tightenings of the muscles in the womb (contractions) that usually go away with rest, changing position, or drinking water. These are called Braxton Hicks contractions. Contractions may last for hours, days, or even weeks before true labor sets in. If contractions come at regular intervals, become more frequent, increase in intensity, or become painful, you should see your health care provider. What are the signs of labor?  Abdominal cramps.  Regular contractions that start at 10 minutes apart and become stronger and more frequent with time.  Contractions that start on the top of the uterus and spread down to the lower abdomen and back.  Increased pelvic pressure and dull back pain.  A watery or bloody mucus discharge that comes from the vagina.  Leaking of amniotic fluid. This is also known as your "water breaking." It could be a slow trickle or a gush. Let your health care provider know if it has a color or strange odor. If you have any of these signs, call your health care provider right away, even if it is before your due date. Follow these instructions at home: Medicines  Follow your health care provider's instructions regarding medicine use. Specific medicines may be either safe or unsafe to take during pregnancy.  Take a prenatal vitamin that contains at least 600 micrograms (mcg) of folic acid.  If you develop constipation, try taking a stool softener if your health care provider approves. Eating and drinking   Eat a balanced diet that includes fresh fruits and vegetables, whole grains, good sources of protein such as meat, eggs, or tofu,  and low-fat dairy. Your health care provider will help you determine the amount of weight gain that is right for you.  Avoid raw meat and uncooked cheese. These carry germs that can cause birth defects in the baby.  If you have low calcium intake from food, talk to your health care provider about whether you should take a daily calcium supplement.  Eat four or five small meals rather than three large meals a day.  Limit foods that are high in fat and processed sugars, such as fried and sweet foods.  To prevent constipation: ? Drink enough fluid to keep your urine clear or pale yellow. ? Eat foods that are high in fiber, such as fresh fruits and vegetables, whole grains, and beans. Activity  Exercise only as directed by your health care provider. Most women can continue their usual exercise routine during pregnancy. Try to exercise for 30 minutes at least 5 days a week. Stop exercising if you experience uterine contractions.  Avoid heavy lifting.  Do   not exercise in extreme heat or humidity, or at high altitudes.  Wear low-heel, comfortable shoes.  Practice good posture.  You may continue to have sex unless your health care provider tells you otherwise. Relieving pain and discomfort  Take frequent breaks and rest with your legs elevated if you have leg cramps or low back pain.  Take warm sitz baths to soothe any pain or discomfort caused by hemorrhoids. Use hemorrhoid cream if your health care provider approves.  Wear a good support bra to prevent discomfort from breast tenderness.  If you develop varicose veins: ? Wear support pantyhose or compression stockings as told by your healthcare provider. ? Elevate your feet for 15 minutes, 3-4 times a day. Prenatal care  Write down your questions. Take them to your prenatal visits.  Keep all your prenatal visits as told by your health care provider. This is important. Safety  Wear your seat belt at all times when driving.  Make  a list of emergency phone numbers, including numbers for family, friends, the hospital, and police and fire departments. General instructions  Avoid cat litter boxes and soil used by cats. These carry germs that can cause birth defects in the baby. If you have a cat, ask someone to clean the litter box for you.  Do not travel far distances unless it is absolutely necessary and only with the approval of your health care provider.  Do not use hot tubs, steam rooms, or saunas.  Do not drink alcohol.  Do not use any products that contain nicotine or tobacco, such as cigarettes and e-cigarettes. If you need help quitting, ask your health care provider.  Do not use any medicinal herbs or unprescribed drugs. These chemicals affect the formation and growth of the baby.  Do not douche or use tampons or scented sanitary pads.  Do not cross your legs for long periods of time.  To prepare for the arrival of your baby: ? Take prenatal classes to understand, practice, and ask questions about labor and delivery. ? Make a trial run to the hospital. ? Visit the hospital and tour the maternity area. ? Arrange for maternity or paternity leave through employers. ? Arrange for family and friends to take care of pets while you are in the hospital. ? Purchase a rear-facing car seat and make sure you know how to install it in your car. ? Pack your hospital bag. ? Prepare the baby's nursery. Make sure to remove all pillows and stuffed animals from the baby's crib to prevent suffocation.  Visit your dentist if you have not gone during your pregnancy. Use a soft toothbrush to brush your teeth and be gentle when you floss. Contact a health care provider if:  You are unsure if you are in labor or if your water has broken.  You become dizzy.  You have mild pelvic cramps, pelvic pressure, or nagging pain in your abdominal area.  You have lower back pain.  You have persistent nausea, vomiting, or diarrhea.   You have an unusual or bad smelling vaginal discharge.  You have pain when you urinate. Get help right away if:  Your water breaks before 37 weeks.  You have regular contractions less than 5 minutes apart before 37 weeks.  You have a fever.  You are leaking fluid from your vagina.  You have spotting or bleeding from your vagina.  You have severe abdominal pain or cramping.  You have rapid weight loss or weight gain.  You have   shortness of breath with chest pain.  You notice sudden or extreme swelling of your face, hands, ankles, feet, or legs.  Your baby makes fewer than 10 movements in 2 hours.  You have severe headaches that do not go away when you take medicine.  You have vision changes. Summary  The third trimester is from week 28 through week 40, months 7 through 9. The third trimester is a time when the unborn baby (fetus) is growing rapidly.  During the third trimester, your discomfort may increase as you and your baby continue to gain weight. You may have abdominal, leg, and back pain, sleeping problems, and an increased need to urinate.  During the third trimester your breasts will keep growing and they will continue to become tender. A yellow fluid (colostrum) may leak from your breasts. This is the first milk you are producing for your baby.  False labor is a condition in which you feel small, irregular tightenings of the muscles in the womb (contractions) that eventually go away. These are called Braxton Hicks contractions. Contractions may last for hours, days, or even weeks before true labor sets in.  Signs of labor can include: abdominal cramps; regular contractions that start at 10 minutes apart and become stronger and more frequent with time; watery or bloody mucus discharge that comes from the vagina; increased pelvic pressure and dull back pain; and leaking of amniotic fluid. This information is not intended to replace advice given to you by your health  care provider. Make sure you discuss any questions you have with your health care provider. Document Released: 12/02/2001 Document Revised: 03/31/2019 Document Reviewed: 01/13/2017 Elsevier Patient Education  2020 [employer]  

## 2019-10-05 ENCOUNTER — Other Ambulatory Visit: Payer: Self-pay | Admitting: Obstetrics and Gynecology

## 2019-10-05 ENCOUNTER — Telehealth: Payer: Self-pay | Admitting: *Deleted

## 2019-10-05 DIAGNOSIS — O10919 Unspecified pre-existing hypertension complicating pregnancy, unspecified trimester: Secondary | ICD-10-CM

## 2019-10-05 NOTE — Telephone Encounter (Signed)
Patient states she was getting an "abnormal reading" on her blood pressure monitor.

## 2019-10-05 NOTE — Telephone Encounter (Signed)
Patient states her initial reading on her monitor was 147/90 but has rechecked it and now is 135/80.  Denies vision changes or headache. Advised to continue to monitor and if she develops symptoms, to [facility], we will assess at her appt tomorrow.  Pt verbalized understanding and agreeable to plan.

## 2019-10-06 ENCOUNTER — Ambulatory Visit (INDEPENDENT_AMBULATORY_CARE_PROVIDER_SITE_OTHER): Payer: BC Managed Care – PPO

## 2019-10-06 ENCOUNTER — Other Ambulatory Visit: Payer: Self-pay

## 2019-10-06 ENCOUNTER — Ambulatory Visit (INDEPENDENT_AMBULATORY_CARE_PROVIDER_SITE_OTHER): Payer: BC Managed Care – PPO | Admitting: Obstetrics & Gynecology

## 2019-10-06 ENCOUNTER — Encounter: Payer: Self-pay | Admitting: Obstetrics & Gynecology

## 2019-10-06 VITALS — BP 138/88 | HR 98 | Wt 259.0 lb

## 2019-10-06 DIAGNOSIS — O10919 Unspecified pre-existing hypertension complicating pregnancy, unspecified trimester: Secondary | ICD-10-CM

## 2019-10-06 DIAGNOSIS — Z3A35 35 weeks gestation of pregnancy: Secondary | ICD-10-CM

## 2019-10-06 DIAGNOSIS — O10913 Unspecified pre-existing hypertension complicating pregnancy, third trimester: Secondary | ICD-10-CM | POA: Diagnosis not present

## 2019-10-06 DIAGNOSIS — O0993 Supervision of high risk pregnancy, unspecified, third trimester: Secondary | ICD-10-CM

## 2019-10-06 DIAGNOSIS — A749 Chlamydial infection, unspecified: Secondary | ICD-10-CM

## 2019-10-06 DIAGNOSIS — I10 Essential (primary) hypertension: Secondary | ICD-10-CM

## 2019-10-06 DIAGNOSIS — O0990 Supervision of high risk pregnancy, unspecified, unspecified trimester: Secondary | ICD-10-CM

## 2019-10-06 DIAGNOSIS — Z331 Pregnant state, incidental: Secondary | ICD-10-CM

## 2019-10-06 DIAGNOSIS — Z1389 Encounter for screening for other disorder: Secondary | ICD-10-CM

## 2019-10-06 DIAGNOSIS — O98819 Other maternal infectious and parasitic diseases complicating pregnancy, unspecified trimester: Secondary | ICD-10-CM

## 2019-10-06 LAB — POCT URINALYSIS DIPSTICK OB
Blood, UA: NEGATIVE
Glucose, UA: NEGATIVE
Ketones, UA: NEGATIVE
Leukocytes, UA: NEGATIVE
Nitrite, UA: NEGATIVE
POC,PROTEIN,UA: NEGATIVE

## 2019-10-06 NOTE — Progress Notes (Signed)
   [place] PREGNANCY VISIT Patient name: Alicia Adkins MRN [id number]  Date of birth: September 05, 1996 Chief Complaint:   Routine Prenatal Visit  History of Present Illness:   Alicia Adkins is a 22 y.o. G32P0000 female at [redacted]w[redacted]d with an Estimated Date of Delivery: 11/08/19 being seen today for ongoing management of a high-risk pregnancy complicated by chronic hypertension currently on no meds.  Today she reports no complaints. Contractions: Not present. Vag. Bleeding: None.  Movement: Present. denies leaking of fluid.  Review of Systems:   Pertinent items are noted in HPI Denies abnormal vaginal discharge w/ itching/odor/irritation, headaches, visual changes, shortness of breath, chest pain, abdominal pain, severe nausea/vomiting, or problems with urination or bowel movements unless otherwise stated above. Pertinent History Reviewed:  Reviewed past medical,surgical, social, obstetrical and family history.  Reviewed problem list, medications and allergies. Physical Assessment:   Vitals:   10/06/19 1114  BP: 138/88  Pulse: 98  Weight: 259 lb (117.5 kg)  Body mass index is 45.16 kg/m.           Physical Examination:   General appearance: alert, well appearing, and in no distress  Mental status: alert, oriented to person, place, and time  Skin: warm & dry   Extremities: Edema: None    Cardiovascular: normal heart rate noted  Respiratory: normal respiratory effort, no distress  Abdomen: gravid, soft, non-tender  Pelvic: Cervical exam deferred         Fetal Status: Fetal Heart Rate (bpm): 146   Movement: Present    Fetal Surveillance Testing today: BPP 8/8 normal Dopplers   Results for orders placed or performed in visit on 10/06/19 (from the past 24 hour(s))  POC Urinalysis Dipstick OB   Collection Time: 10/06/19 11:15 AM  Result Value Ref Range   Color, UA     Clarity, UA     Glucose, UA Negative Negative   Bilirubin, UA     Ketones, UA n    Spec Grav, UA     Blood,  UA n    pH, UA     POC,PROTEIN,UA Negative Negative, Trace, Small (1+), Moderate (2+), Large (3+), 4+   Urobilinogen, UA     Nitrite, UA n    Leukocytes, UA Negative Negative   Appearance     Odor      Assessment & Plan:  1) High-risk pregnancy G1P0000 at [redacted]w[redacted]d with an Estimated Date of Delivery: 11/08/19   2) CHTN, no meds, stable, still no meds needed, continue weekly BPP for surveillance and plan IOL 40 weeks or as clinically indicated    Meds: No orders of the defined types were placed in this encounter.   Labs/procedures today: BPP 8/8 with normal Dopplers  Treatment Plan:  As above  Reviewed: Term labor symptoms and general obstetric precautions including but not limited to vaginal bleeding, contractions, leaking of fluid and fetal movement were reviewed in detail with the patient.  All questions were answered. Has home bp cuff. Rx faxed to . Check bp weekly, let us know if >140/90.   Follow-up: Return in about 1 week (around 10/13/2019) for BPP/sono, HROB.  Orders Placed This Encounter  Procedures  . POC Urinalysis Dipstick OB   Mertie Clause Jerzey Komperda  10/06/2019 11:24 AM

## 2019-10-06 NOTE — Progress Notes (Addendum)
[place] 01+7 wks,cephalic,BPP 5/1,WCHEN lateral placenta gr 2,RI .62,.69,.68,.67=85% ,AFI 13 cm

## 2019-10-12 ENCOUNTER — Other Ambulatory Visit: Payer: Self-pay | Admitting: Obstetrics & Gynecology

## 2019-10-12 DIAGNOSIS — O10919 Unspecified pre-existing hypertension complicating pregnancy, unspecified trimester: Secondary | ICD-10-CM

## 2019-10-13 ENCOUNTER — Other Ambulatory Visit (HOSPITAL_COMMUNITY)
Admission: RE | Admit: 2019-10-13 | Discharge: 2019-10-13 | Disposition: A | Discharge status: Home / Self Care | Payer: BC Managed Care – PPO | Source: Ambulatory Visit | Attending: Obstetrics and Gynecology | Admitting: Obstetrics and Gynecology

## 2019-10-13 ENCOUNTER — Encounter: Payer: Self-pay | Admitting: Obstetrics and Gynecology

## 2019-10-13 ENCOUNTER — Other Ambulatory Visit: Payer: Self-pay

## 2019-10-13 ENCOUNTER — Ambulatory Visit (INDEPENDENT_AMBULATORY_CARE_PROVIDER_SITE_OTHER): Payer: BC Managed Care – PPO | Admitting: Obstetrics and Gynecology

## 2019-10-13 ENCOUNTER — Ambulatory Visit (INDEPENDENT_AMBULATORY_CARE_PROVIDER_SITE_OTHER): Payer: BC Managed Care – PPO

## 2019-10-13 VITALS — BP 135/84 | HR 95 | Wt 261.6 lb

## 2019-10-13 DIAGNOSIS — Z331 Pregnant state, incidental: Secondary | ICD-10-CM

## 2019-10-13 DIAGNOSIS — O10919 Unspecified pre-existing hypertension complicating pregnancy, unspecified trimester: Secondary | ICD-10-CM

## 2019-10-13 DIAGNOSIS — Z3A36 36 weeks gestation of pregnancy: Secondary | ICD-10-CM | POA: Diagnosis not present

## 2019-10-13 DIAGNOSIS — O0990 Supervision of high risk pregnancy, unspecified, unspecified trimester: Secondary | ICD-10-CM | POA: Diagnosis not present

## 2019-10-13 DIAGNOSIS — Z1389 Encounter for screening for other disorder: Secondary | ICD-10-CM

## 2019-10-13 DIAGNOSIS — O10913 Unspecified pre-existing hypertension complicating pregnancy, third trimester: Secondary | ICD-10-CM

## 2019-10-13 DIAGNOSIS — O0993 Supervision of high risk pregnancy, unspecified, third trimester: Secondary | ICD-10-CM

## 2019-10-13 DIAGNOSIS — O98819 Other maternal infectious and parasitic diseases complicating pregnancy, unspecified trimester: Secondary | ICD-10-CM

## 2019-10-13 DIAGNOSIS — A749 Chlamydial infection, unspecified: Secondary | ICD-10-CM

## 2019-10-13 LAB — POCT URINALYSIS DIPSTICK OB
Blood, UA: NEGATIVE
Glucose, UA: NEGATIVE
Ketones, UA: NEGATIVE
Nitrite, UA: NEGATIVE
POC,PROTEIN,UA: NEGATIVE

## 2019-10-13 NOTE — Progress Notes (Signed)
[place] 01+3 wks,cephalic,right lateral placenta gr 3,RI .59,.65,.67=84%,AFI 12.5 CM,FHR 146 BPM,BPP 8/8,normal ovaries bilat,EFW 3143 g 76% AC 98%

## 2019-10-13 NOTE — Progress Notes (Addendum)
Patient ID: Alicia Adkins, female   DOB: 1996-09-18, 23 y.o.   MRN: [id number]    [facility] PREGNANCY VISIT Patient name: Alicia Adkins MRN [id number]  Date of birth: [birth year] Chief Complaint:   High Risk Gestation ([place] today; GBS, GC/CHL; swelling in feet)  History of Present Illness:   Alicia Adkins is a 23 y.o. G46P0000 female at [redacted]w[redacted]d with an Estimated Date of Delivery: 11/08/19 being seen today for ongoing management of a high-risk pregnancy complicated by chronic hypertension currently on no meds. Today she reports no complaints.   Contractions: Not present. Vag. Bleeding: None.  Movement: Present. denies leaking of fluid.  Review of Systems:   Pertinent items are noted in HPI Denies abnormal vaginal discharge w/ itching/odor/irritation, headaches, visual changes, shortness of breath, chest pain, abdominal pain, severe nausea/vomiting, or problems with urination or bowel movements unless otherwise stated above. Pertinent History Reviewed:  Reviewed past medical,surgical, social, obstetrical and family history.  Reviewed problem list, medications and allergies. Physical Assessment:   Vitals:   10/13/19 1450 10/13/19 1454  BP: (!) 149/87 135/84  Pulse: (!) 103 95  Weight: 261 lb 9.6 oz (118.7 kg)   Body mass index is 45.61 kg/m.           Physical Examination:   General appearance: alert, well appearing, and in no distress and oriented to person, place, and time  Mental status: alert, oriented to person, place, and time, normal mood, behavior, speech, dress, motor activity, and thought processes, affect appropriate to mood  Skin: warm & dry   Extremities: Edema: Trace    Cardiovascular: normal heart rate noted  Respiratory: normal respiratory effort, no distress  Abdomen: gravid, soft, non-tender  Pelvic: Cervical exam performed         Fetal Status:     Movement: Present   25% effacement  Fetal Surveillance Testing today: [place] 36+2 wks,cephalic,right  lateral placenta gr 3,RI .59,.65,.67=84%,AFI 12.5 CM,FHR 146 BPM,BPP 8/8,normal ovaries bilat,EFW 3143 g 76% AC 98% Doppler studies reassuring   Results for orders placed or performed in visit on 10/13/19 (from the past 24 hour(s))  POC Urinalysis Dipstick OB   Collection Time: 10/13/19  2:51 PM  Result Value Ref Range   Color, UA     Clarity, UA     Glucose, UA Negative Negative   Bilirubin, UA     Ketones, UA neg    Spec Grav, UA     Blood, UA neg    pH, UA     POC,PROTEIN,UA Negative Negative, Trace, Small (1+), Moderate (2+), Large (3+), 4+   Urobilinogen, UA     Nitrite, UA neg    Leukocytes, UA Trace (A) Negative   Appearance     Odor      Assessment & Plan:  1) High-risk pregnancy G1P0000 at [redacted]w[redacted]d with an Estimated Date of Delivery: 11/08/19   2) CHTN, no meds, stable, still no meds needed  Meds: No orders of the defined types were placed in this encounter.  Labs/procedures today: GBS, GC/CHL                                            U/a proteinuria negative.  Treatment Plan:  continue weekly BPP for surveillance and plan IOL at 39 weeks or as clinically indicated if clinical condition changes.  Reviewed: Term labor symptoms and general obstetric precautions including  but not limited to vaginal bleeding, contractions, leaking of fluid and fetal movement were reviewed in detail with the patient.  All questions were answered. Check bp weekly, let us know if >140/90.   Follow-up: Return in about 1 week (around 10/20/2019) for HROB, U/S: BPP.  Orders Placed This Encounter  Procedures  . Strep Gp B NAA  . POC Urinalysis Dipstick OB   By signing my name below, I, De Burrs, attest that this documentation has been prepared under the direction and in the presence of Jonnie Kind, MD. Electronically Signed: De Burrs, Medical Scribe. 10/13/19. 3:24 PM.  I personally performed the services described in this documentation, which was SCRIBED in my presence. The  recorded information has been reviewed and considered accurate. It has been edited as necessary during review. Jonnie Kind, MD

## 2019-10-14 ENCOUNTER — Other Ambulatory Visit: Payer: Self-pay | Admitting: Obstetrics and Gynecology

## 2019-10-14 DIAGNOSIS — O10919 Unspecified pre-existing hypertension complicating pregnancy, unspecified trimester: Secondary | ICD-10-CM

## 2019-10-15 LAB — STREP GP B NAA: Strep Gp B NAA: POSITIVE — AB

## 2019-10-17 ENCOUNTER — Other Ambulatory Visit: Payer: Self-pay

## 2019-10-17 ENCOUNTER — Ambulatory Visit (INDEPENDENT_AMBULATORY_CARE_PROVIDER_SITE_OTHER): Payer: BC Managed Care – PPO

## 2019-10-17 ENCOUNTER — Telehealth (INDEPENDENT_AMBULATORY_CARE_PROVIDER_SITE_OTHER): Payer: BC Managed Care – PPO | Admitting: Obstetrics & Gynecology

## 2019-10-17 VITALS — BP 136/80 | HR 94

## 2019-10-17 DIAGNOSIS — O0993 Supervision of high risk pregnancy, unspecified, third trimester: Secondary | ICD-10-CM

## 2019-10-17 DIAGNOSIS — O10913 Unspecified pre-existing hypertension complicating pregnancy, third trimester: Secondary | ICD-10-CM

## 2019-10-17 DIAGNOSIS — Z3A36 36 weeks gestation of pregnancy: Secondary | ICD-10-CM | POA: Diagnosis not present

## 2019-10-17 DIAGNOSIS — O10919 Unspecified pre-existing hypertension complicating pregnancy, unspecified trimester: Secondary | ICD-10-CM

## 2019-10-17 DIAGNOSIS — Z3A39 39 weeks gestation of pregnancy: Secondary | ICD-10-CM

## 2019-10-17 DIAGNOSIS — O0990 Supervision of high risk pregnancy, unspecified, unspecified trimester: Secondary | ICD-10-CM

## 2019-10-17 NOTE — Progress Notes (Signed)
[place] 02+7 wks,cephalic,BPP 2/5,DGU 440 bpm,afi 11.8 cm,normal ovaries bilat,RI .54,.57,.59=53%

## 2019-10-18 LAB — CERVICOVAGINAL ANCILLARY ONLY
Chlamydia: NEGATIVE
Comment: NEGATIVE
Comment: NORMAL
Neisseria Gonorrhea: NEGATIVE

## 2019-10-25 ENCOUNTER — Other Ambulatory Visit: Payer: Self-pay | Admitting: Obstetrics and Gynecology

## 2019-10-25 DIAGNOSIS — O10919 Unspecified pre-existing hypertension complicating pregnancy, unspecified trimester: Secondary | ICD-10-CM

## 2019-10-26 ENCOUNTER — Ambulatory Visit (INDEPENDENT_AMBULATORY_CARE_PROVIDER_SITE_OTHER): Payer: BC Managed Care – PPO

## 2019-10-26 ENCOUNTER — Other Ambulatory Visit: Payer: Self-pay

## 2019-10-26 ENCOUNTER — Encounter: Payer: Self-pay | Admitting: Obstetrics and Gynecology

## 2019-10-26 ENCOUNTER — Ambulatory Visit (INDEPENDENT_AMBULATORY_CARE_PROVIDER_SITE_OTHER): Payer: BC Managed Care – PPO | Admitting: Obstetrics and Gynecology

## 2019-10-26 ENCOUNTER — Other Ambulatory Visit: Payer: Self-pay | Admitting: Advanced Practice Midwife

## 2019-10-26 VITALS — BP 144/89 | HR 100 | Wt 263.8 lb

## 2019-10-26 DIAGNOSIS — Z1389 Encounter for screening for other disorder: Secondary | ICD-10-CM

## 2019-10-26 DIAGNOSIS — O10919 Unspecified pre-existing hypertension complicating pregnancy, unspecified trimester: Secondary | ICD-10-CM

## 2019-10-26 DIAGNOSIS — O0993 Supervision of high risk pregnancy, unspecified, third trimester: Secondary | ICD-10-CM

## 2019-10-26 DIAGNOSIS — O10913 Unspecified pre-existing hypertension complicating pregnancy, third trimester: Secondary | ICD-10-CM

## 2019-10-26 DIAGNOSIS — I10 Essential (primary) hypertension: Secondary | ICD-10-CM

## 2019-10-26 DIAGNOSIS — Z331 Pregnant state, incidental: Secondary | ICD-10-CM

## 2019-10-26 DIAGNOSIS — O0990 Supervision of high risk pregnancy, unspecified, unspecified trimester: Secondary | ICD-10-CM

## 2019-10-26 DIAGNOSIS — O99820 Streptococcus B carrier state complicating pregnancy: Secondary | ICD-10-CM | POA: Insufficient documentation

## 2019-10-26 DIAGNOSIS — A749 Chlamydial infection, unspecified: Secondary | ICD-10-CM

## 2019-10-26 DIAGNOSIS — Z3A38 38 weeks gestation of pregnancy: Secondary | ICD-10-CM | POA: Diagnosis not present

## 2019-10-26 LAB — POCT URINALYSIS DIPSTICK OB
Blood, UA: NEGATIVE
Glucose, UA: NEGATIVE
Ketones, UA: NEGATIVE
Nitrite, UA: NEGATIVE

## 2019-10-26 NOTE — Progress Notes (Signed)
[place] 59+4 wks,cephalic,BPP 5/8,PFY 924 BPM,RI .53,.61,.53,.59=61%,AFI 13 cm,right lateral placenta gr 3

## 2019-10-26 NOTE — Progress Notes (Signed)
Subjective:  Alicia Adkins is a 23 y.o. G1P0000 at [redacted]w[redacted]d being seen today for ongoing prenatal care.  She is currently monitored for the following issues for this high-risk pregnancy and has Depression; Supervision of high risk pregnancy, antepartum; Hypertension; Chlamydia infection affecting pregnancy, antepartum; Chronic hypertension affecting pregnancy; and GBS (group B Streptococcus carrier), +RV culture, currently pregnant on their problem list.  Patient reports general discomforts of pregnancy. Denies HA or visual changes.  Contractions: Not present. Vag. Bleeding: None.  Movement: Present. Denies leaking of fluid.   The following portions of the patient's history were reviewed and updated as appropriate: allergies, current medications, past family history, past medical history, past social history, past surgical history and problem list. Problem list updated.  Objective:   Vitals:   10/26/19 1153 10/26/19 1156  BP: (!) 151/90 (!) 144/89  Pulse: (!) 104 100  Weight: 263 lb 12.8 oz (119.7 kg)     Fetal Status:     Movement: Present     General:  Alert, oriented and cooperative. Patient is in no acute distress.  Skin: Skin is warm and dry. No rash noted.   Cardiovascular: Normal heart rate noted  Respiratory: Normal respiratory effort, no problems with respiration noted  Abdomen: Soft, gravid, appropriate for gestational age. Pain/Pressure: Present     Pelvic:  Cervical exam performed        Extremities: Normal range of motion.  Edema: None  Mental Status: Normal mood and affect. Normal behavior. Normal judgment and thought content.   Urinalysis:      Assessment and Plan:  Pregnancy: G1P0000 at [redacted]w[redacted]d  1. Supervision of high risk pregnancy, antepartum Stable Labor precautions  2. Screening for genitourinary condition  - POC Urinalysis Dipstick OB  3. Pregnant state, incidental  - POC Urinalysis Dipstick OB  4. Chronic hypertension affecting pregnancy BP stable  without meds No S/Sx of PEC S/Sx of PEC reviewed Continue to monitor BP at [facility] BPP and growth scan next week IOL at 39 weeks  5. GBS (group B Streptococcus carrier), +RV culture, currently pregnant Tx while in labor  Term labor symptoms and general obstetric precautions including but not limited to vaginal bleeding, contractions, leaking of fluid and fetal movement were reviewed in detail with the patient. Please refer to After Visit Summary for other counseling recommendations.  Return in about 1 week (around 11/02/2019) for OB visitface to face, .   Chancy Milroy, MD

## 2019-10-27 ENCOUNTER — Encounter (HOSPITAL_COMMUNITY): Payer: Self-pay

## 2019-10-27 ENCOUNTER — Telehealth (HOSPITAL_COMMUNITY): Payer: Self-pay

## 2019-10-28 ENCOUNTER — Other Ambulatory Visit: Payer: Self-pay | Admitting: Obstetrics and Gynecology

## 2019-10-28 DIAGNOSIS — O10919 Unspecified pre-existing hypertension complicating pregnancy, unspecified trimester: Secondary | ICD-10-CM

## 2019-10-30 ENCOUNTER — Other Ambulatory Visit: Payer: Self-pay

## 2019-10-30 ENCOUNTER — Other Ambulatory Visit (HOSPITAL_COMMUNITY)
Admission: RE | Admit: 2019-10-30 | Discharge: 2019-10-30 | Disposition: A | Discharge status: Home / Self Care | Payer: BC Managed Care – PPO | Source: Ambulatory Visit | Attending: Obstetrics & Gynecology | Admitting: Obstetrics & Gynecology

## 2019-10-30 DIAGNOSIS — Z20828 Contact with and (suspected) exposure to other viral communicable diseases: Secondary | ICD-10-CM | POA: Diagnosis not present

## 2019-10-30 LAB — SARS CORONAVIRUS 2 (TAT 6-24 HRS): SARS Coronavirus 2: NEGATIVE

## 2019-10-30 NOTE — MAU Note (Signed)
Pt here for PAT covid swab. Denies symptoms. Swab collected.  

## 2019-10-31 ENCOUNTER — Ambulatory Visit (INDEPENDENT_AMBULATORY_CARE_PROVIDER_SITE_OTHER): Payer: BC Managed Care – PPO

## 2019-10-31 ENCOUNTER — Other Ambulatory Visit: Payer: Self-pay

## 2019-10-31 ENCOUNTER — Other Ambulatory Visit: Payer: Self-pay | Admitting: Obstetrics and Gynecology

## 2019-10-31 ENCOUNTER — Ambulatory Visit (INDEPENDENT_AMBULATORY_CARE_PROVIDER_SITE_OTHER): Payer: BC Managed Care – PPO | Admitting: Obstetrics and Gynecology

## 2019-10-31 VITALS — BP 156/96 | HR 97 | Wt 269.0 lb

## 2019-10-31 DIAGNOSIS — O0993 Supervision of high risk pregnancy, unspecified, third trimester: Secondary | ICD-10-CM

## 2019-10-31 DIAGNOSIS — O0990 Supervision of high risk pregnancy, unspecified, unspecified trimester: Secondary | ICD-10-CM

## 2019-10-31 DIAGNOSIS — Z362 Encounter for other antenatal screening follow-up: Secondary | ICD-10-CM

## 2019-10-31 DIAGNOSIS — O10919 Unspecified pre-existing hypertension complicating pregnancy, unspecified trimester: Secondary | ICD-10-CM | POA: Diagnosis not present

## 2019-10-31 DIAGNOSIS — A749 Chlamydial infection, unspecified: Secondary | ICD-10-CM

## 2019-10-31 DIAGNOSIS — O99820 Streptococcus B carrier state complicating pregnancy: Secondary | ICD-10-CM

## 2019-10-31 DIAGNOSIS — Z3A38 38 weeks gestation of pregnancy: Secondary | ICD-10-CM | POA: Diagnosis not present

## 2019-10-31 DIAGNOSIS — Z331 Pregnant state, incidental: Secondary | ICD-10-CM

## 2019-10-31 DIAGNOSIS — O10913 Unspecified pre-existing hypertension complicating pregnancy, third trimester: Secondary | ICD-10-CM

## 2019-10-31 DIAGNOSIS — I10 Essential (primary) hypertension: Secondary | ICD-10-CM

## 2019-10-31 DIAGNOSIS — Z1389 Encounter for screening for other disorder: Secondary | ICD-10-CM

## 2019-10-31 LAB — POCT URINALYSIS DIPSTICK OB
Blood, UA: NEGATIVE
Glucose, UA: NEGATIVE
Ketones, UA: NEGATIVE
Leukocytes, UA: NEGATIVE
Nitrite, UA: NEGATIVE

## 2019-10-31 NOTE — Progress Notes (Signed)
[place] PREGNANCY VISIT Patient name: CIERRIA HEIGHT MRN [id number]  Date of birth: 04-Nov-1996 Chief Complaint:   Routine Prenatal Visit ([place] today)   History of Present Illness:   LASHAYLA ARMES is a 23 y.o. G81P0000 female at [redacted]w[redacted]d with an Estimated Date of Delivery: 11/08/19 being seen today for ongoing management of a high-risk pregnancy complicated by chronic hypertension with baseline proteinuria, currently on no BP meds but on ASA.  Today she reports no complaints.  He specifically denies headache scotoma blurred vision visual changes of any sort. Contractions: Irregular. Vag. Bleeding: None.  Movement: Present. denies leaking of fluid.  Review of Systems:   Pertinent items are noted in HPI Denies abnormal vaginal discharge w/ itching/odor/irritation, headaches, visual changes, shortness of breath, chest pain, abdominal pain, severe nausea/vomiting, or problems with urination or bowel movements unless otherwise stated above. Pertinent History Reviewed:  Reviewed past medical,surgical, social, obstetrical and family history.  Reviewed problem list, medications and allergies. Physical Assessment:   Vitals:   10/31/19 1142  Weight: 269 lb (122 kg)  Body mass index is 46.9 kg/m.     BP (!) 156/96   Pulse 97   Wt 269 lb (122 kg)   LMP 02/01/2019   BMI 46.90 kg/m Weight is up 6 pounds in the last week.  Recheck of blood pressure is better, proximal 152/90       Physical Examination:   General appearance: alert, well appearing, and in no distress, oriented to person, place, and time and overweight  Mental status: alert, oriented to person, place, and time, normal mood, behavior, speech, dress, motor activity, and thought processes  Skin: warm & dry    Extremities: Edema: Trace reflexes 2+ without clonus   Cardiovascular: normal heart rate noted  Respiratory: normal respiratory effort, no distress  Abdomen: gravid, soft, non-tender  Pelvic: Cervical exam performed          Fetal Status:     Movement: Present    Fetal Surveillance Testing today: BPP 8/8 Doppler studies showing Good fetal status and from placental function.  FOLLOW UP SONOGRAM   LASHICA HANNAY is in the office for a follow up sonogram for EFW,BPP and cord dopplers.  She is a 23 y.o. year old G1P0000 with Estimated Date of Delivery: 11/08/19 by LMP now at  [redacted]w[redacted]d weeks gestation. Thus far the pregnancy has been complicated by [facility].  GESTATION: SINGLETON  PRESENTATION: cephalic  FETAL ACTIVITY:          Heart rate         159          The fetus is active.  AMNIOTIC FLUID: The amniotic fluid volume is  normal, 13 cm.  PLACENTA LOCALIZATION:  Right lateral GRADE 3  CERVIX: Limited view   GESTATIONAL AGE AND  BIOMETRICS:  Gestational criteria: Estimated Date of Delivery: 11/08/19 by LMP now at [redacted]w[redacted]d  Previous Scans:14              BIPARIETAL DIAMETER           9.28 cm         37+5 weeks  HEAD CIRCUMFERENCE           33.65 cm         38+3 weeks  ABDOMINAL CIRCUMFERENCE           34.94 cm         38+5 weeks  FEMUR LENGTH  7.25 cm         37 weeks                                                           AVERAGE EGA(BY THIS SCAN):  37+6 weeks                                                 ESTIMATED FETAL WEIGHT:       3450  grams, 54 %   BIOPHYSICAL PROFILE:                                                                                                      COMMENTS GROSS BODY MOVEMENT                 2   TONE                2   RESPIRATIONS                2   AMNIOTIC FLUID                2                                                            SCORE:  8/8 (Note: NST was not performed as part of this antepartum testing)  DOPPLER FLOW STUDIES: UMBILICAL ARTERY RI RATIOS:    .51,.58,.51,.52=49% S/D 2.17=44%       ANATOMICAL SURVEY                                                                             COMMENTS CEREBRAL VENTRICLES yes normal   CHOROID PLEXUS yes normal   CEREBELLUM yes normal                                 4 CHAMBERED HEART yes normal   OUTFLOW TRACTS yes normal   DIAPHRAGM yes normal   STOMACH yes normal   RENAL REGION yes normal   BLADDER yes normal        3 VESSEL CORD yes normal  GENITALIA yes normal Female         SUSPECTED ABNORMALITIES:  no  QUALITY OF SCAN: satisfactory  TECHNICIAN COMMENTS:  [place] 38+6 wks,cephalic,RI .51,.58,.51,.52=49% S/D 2.17=44%,AFI 13 cm,BPP 8/8,FHR 159 BPM,EFW 3450 g 54%       A copy of this report including all images has been saved and backed up to a second source for retrieval if needed. All measures and details of the anatomical scan, placentation, fluid volume and pelvic anatomy are contained in that report.  Karie Chimera 10/31/2019 11:49 AM       Chaperone: Dr Obie Dredge resident  Results for orders placed or performed in visit on 10/31/19 (from the past 24 hour(s))  POC Urinalysis Dipstick OB   Collection Time: 10/31/19 11:46 AM  Result Value Ref Range   Color, UA     Clarity, UA     Glucose, UA Negative Negative   Bilirubin, UA     Ketones, UA neg    Spec Grav, UA     Blood, UA neg    pH, UA     POC,PROTEIN,UA Small (1+) Negative, Trace, Small (1+), Moderate (2+), Large (3+), 4+   Urobilinogen, UA     Nitrite, UA neg    Leukocytes, UA Negative Negative   Appearance     Odor      Assessment & Plan:  1) High-risk pregnancy G1P0000 at [redacted]w[redacted]d with an Estimated Date of Delivery: 11/08/19   2) chronic hypertension with baseline proteinuria, now with some increasing proteinuria, stable, scheduled for induction.  Induction is advanced to midnight tonight  3/fetal wellbeing indicated by specifically reassuring fetal assessment with good BPP, fluid and excellent Doppler flow   Meds: No orders of the  defined types were placed in this encounter.   Labs/procedures today: Ultrasound for fetal growth 55 percentile see lab results above  Treatment Plan: Induction of labor tonight, to arrive at 1145 tonight for induction.  Cytotec ripening of cervix to be followed probably by Foley bulb placement morning.  Reviewed: Admission for labor Check bp later today, let us know if >140/90.  Blood pressure over 160 systolic or 95 diastolic may consider moving her to direct admission.   Follow-up: No follow-ups on file.  Orders Placed This Encounter  Procedures  . POC Urinalysis Dipstick OB   Tilda Burrow CNM, [facility] 10/31/2019 11:48 AM

## 2019-10-31 NOTE — Progress Notes (Signed)
[place] 02+5 wks,cephalic,RI .85,.27,.78,.24=23% S/D 2.17=44%,AFI 13 cm,BPP 8/8,FHR 159 BPM,EFW 3450 g 54%

## 2019-11-01 ENCOUNTER — Inpatient Hospital Stay (HOSPITAL_COMMUNITY): Payer: BC Managed Care – PPO | Admitting: Anesthesiology

## 2019-11-01 ENCOUNTER — Inpatient Hospital Stay (HOSPITAL_COMMUNITY)
Admission: AD | Admit: 2019-11-01 | Discharge: 2019-11-04 | DRG: 807 | Disposition: A | Discharge status: Home / Self Care | Payer: BC Managed Care – PPO | Attending: Obstetrics and Gynecology | Admitting: Obstetrics and Gynecology

## 2019-11-01 ENCOUNTER — Other Ambulatory Visit: Payer: Self-pay

## 2019-11-01 ENCOUNTER — Inpatient Hospital Stay (HOSPITAL_COMMUNITY): Payer: BC Managed Care – PPO

## 2019-11-01 DIAGNOSIS — O114 Pre-existing hypertension with pre-eclampsia, complicating childbirth: Secondary | ICD-10-CM | POA: Diagnosis present

## 2019-11-01 DIAGNOSIS — F32A Depression, unspecified: Secondary | ICD-10-CM | POA: Diagnosis present

## 2019-11-01 DIAGNOSIS — Z3A39 39 weeks gestation of pregnancy: Secondary | ICD-10-CM | POA: Diagnosis not present

## 2019-11-01 DIAGNOSIS — O10013 Pre-existing essential hypertension complicating pregnancy, third trimester: Secondary | ICD-10-CM | POA: Diagnosis not present

## 2019-11-01 DIAGNOSIS — O0990 Supervision of high risk pregnancy, unspecified, unspecified trimester: Secondary | ICD-10-CM

## 2019-11-01 DIAGNOSIS — O99824 Streptococcus B carrier state complicating childbirth: Secondary | ICD-10-CM | POA: Diagnosis not present

## 2019-11-01 DIAGNOSIS — O119 Pre-existing hypertension with pre-eclampsia, unspecified trimester: Secondary | ICD-10-CM | POA: Diagnosis present

## 2019-11-01 DIAGNOSIS — O1002 Pre-existing essential hypertension complicating childbirth: Secondary | ICD-10-CM | POA: Diagnosis not present

## 2019-11-01 DIAGNOSIS — O164 Unspecified maternal hypertension, complicating childbirth: Secondary | ICD-10-CM | POA: Diagnosis not present

## 2019-11-01 DIAGNOSIS — A749 Chlamydial infection, unspecified: Secondary | ICD-10-CM | POA: Diagnosis present

## 2019-11-01 DIAGNOSIS — O99820 Streptococcus B carrier state complicating pregnancy: Secondary | ICD-10-CM

## 2019-11-01 DIAGNOSIS — F329 Major depressive disorder, single episode, unspecified: Secondary | ICD-10-CM | POA: Diagnosis present

## 2019-11-01 LAB — PROTEIN / CREATININE RATIO, URINE
Creatinine, Urine: 124.23 mg/dL
Protein Creatinine Ratio: 0.41 mg/mg{Cre} — ABNORMAL HIGH (ref 0.00–0.15)
Total Protein, Urine: 51 mg/dL

## 2019-11-01 LAB — COMPREHENSIVE METABOLIC PANEL
ALT: 17 U/L (ref 0–44)
AST: 20 U/L (ref 15–41)
Albumin: 2.5 g/dL — ABNORMAL LOW (ref 3.5–5.0)
Alkaline Phosphatase: 100 U/L (ref 38–126)
Anion gap: 8 (ref 5–15)
BUN: 5 mg/dL — ABNORMAL LOW (ref 6–20)
CO2: 19 mmol/L — ABNORMAL LOW (ref 22–32)
Calcium: 9 mg/dL (ref 8.9–10.3)
Chloride: 109 mmol/L (ref 98–111)
Creatinine, Ser: 0.49 mg/dL (ref 0.44–1.00)
GFR calc Af Amer: >60 mL/min (ref >60)
GFR calc non Af Amer: >60 mL/min (ref >60)
Glucose, Bld: 97 mg/dL (ref 70–99)
Potassium: 3.7 mmol/L (ref 3.5–5.1)
Sodium: 136 mmol/L (ref 135–145)
Total Bilirubin: 0.1 mg/dL — ABNORMAL LOW (ref 0.3–1.2)
Total Protein: 6.3 g/dL — ABNORMAL LOW (ref 6.5–8.1)

## 2019-11-01 LAB — CBC
HCT: 31.6 % — ABNORMAL LOW (ref 36.0–46.0)
HCT: 33 % — ABNORMAL LOW (ref 36.0–46.0)
Hemoglobin: 10.5 g/dL — ABNORMAL LOW (ref 12.0–15.0)
Hemoglobin: 10.7 g/dL — ABNORMAL LOW (ref 12.0–15.0)
MCH: 29.1 pg (ref 26.0–34.0)
MCH: 28.8 pg (ref 26.0–34.0)
MCHC: 33.2 g/dL (ref 30.0–36.0)
MCHC: 32.4 g/dL (ref 30.0–36.0)
MCV: 87.5 fL (ref 80.0–100.0)
MCV: 88.7 fL (ref 80.0–100.0)
Platelets: 297 K/uL (ref 150–400)
Platelets: 306 10*3/uL (ref 150–400)
RBC: 3.61 MIL/uL — ABNORMAL LOW (ref 3.87–5.11)
RBC: 3.72 MIL/uL — ABNORMAL LOW (ref 3.87–5.11)
RDW: 14.1 % (ref 11.5–15.5)
RDW: 13.9 % (ref 11.5–15.5)
WBC: 12.5 K/uL — ABNORMAL HIGH (ref 4.0–10.5)
WBC: 18.6 10*3/uL — ABNORMAL HIGH (ref 4.0–10.5)
nRBC: 0 % (ref 0.0–0.2)
nRBC: 0 % (ref 0.0–0.2)

## 2019-11-01 LAB — TYPE AND SCREEN
ABO/RH(D): B POS
Antibody Screen: NEGATIVE

## 2019-11-01 LAB — ABO/RH: ABO/RH(D): B POS

## 2019-11-01 LAB — RPR: RPR Ser Ql: NONREACTIVE

## 2019-11-01 MED ORDER — SODIUM CHLORIDE (PF) 0.9 % IJ SOLN
INTRAMUSCULAR | Status: DC | PRN
  Administered 2019-11-01: 12 mL/h via EPIDURAL

## 2019-11-01 MED ORDER — PHENYLEPHRINE 40 MCG/ML (10ML) SYRINGE FOR IV PUSH (FOR BLOOD PRESSURE SUPPORT): 80.0000 ug | PREFILLED_SYRINGE | INTRAVENOUS | Status: DC | PRN

## 2019-11-01 MED ORDER — OXYCODONE-ACETAMINOPHEN 5-325 MG PO TABS: 2.0000 | ORAL_TABLET | ORAL | Status: DC | PRN

## 2019-11-01 MED ORDER — LACTATED RINGERS IV SOLN: 500.0000 mL | Freq: Once | INTRAVENOUS | Status: DC

## 2019-11-01 MED ORDER — HYDRALAZINE HCL 20 MG/ML IJ SOLN
10.0000 mg | Freq: Once | INTRAMUSCULAR | Status: AC
  Administered 2019-11-01: 10 mg via INTRAVENOUS

## 2019-11-01 MED ORDER — LABETALOL HCL 5 MG/ML IV SOLN
80.0000 mg | INTRAVENOUS | Status: DC | PRN
  Administered 2019-11-01: 80 mg via INTRAVENOUS
  Filled 2019-11-01: qty 16

## 2019-11-01 MED ORDER — SOD CITRATE-CITRIC ACID 500-334 MG/5ML PO SOLN
30.0000 mL | ORAL | Status: DC | PRN
  Administered 2019-11-02 (×2): 30 mL via ORAL
  Filled 2019-11-01 (×2): qty 30

## 2019-11-01 MED ORDER — MAGNESIUM SULFATE BOLUS VIA INFUSION
4.0000 g | Freq: Once | INTRAVENOUS | Status: AC
  Administered 2019-11-01: 4 g via INTRAVENOUS
  Filled 2019-11-01: qty 1000

## 2019-11-01 MED ORDER — LABETALOL HCL 5 MG/ML IV SOLN
20.0000 mg | INTRAVENOUS | Status: DC | PRN
  Administered 2019-11-01: 20 mg via INTRAVENOUS
  Filled 2019-11-01 (×3): qty 4

## 2019-11-01 MED ORDER — HYDRALAZINE HCL 20 MG/ML IJ SOLN
10.0000 mg | INTRAMUSCULAR | Status: DC | PRN
  Administered 2019-11-01: 10 mg via INTRAVENOUS
  Filled 2019-11-01: qty 1

## 2019-11-01 MED ORDER — MISOPROSTOL 50MCG HALF TABLET
50.0000 ug | ORAL_TABLET | ORAL | Status: DC
  Administered 2019-11-01: 50 ug via ORAL
  Filled 2019-11-01: qty 1

## 2019-11-01 MED ORDER — PENICILLIN G POT IN DEXTROSE 60000 UNIT/ML IV SOLN
3.0000 10*6.[IU] | INTRAVENOUS | Status: DC
  Administered 2019-11-01 – 2019-11-02 (×7): 3 10*6.[IU] via INTRAVENOUS
  Filled 2019-11-01 (×9): qty 50

## 2019-11-01 MED ORDER — MISOPROSTOL 25 MCG QUARTER TABLET
25.0000 ug | ORAL_TABLET | ORAL | Status: DC
  Filled 2019-11-01: qty 1

## 2019-11-01 MED ORDER — OXYTOCIN 40 UNITS IN NORMAL SALINE INFUSION - SIMPLE MED
2.5000 [IU]/h | INTRAVENOUS | Status: DC
  Filled 2019-11-01: qty 1000

## 2019-11-01 MED ORDER — ACETAMINOPHEN 325 MG PO TABS: 650.0000 mg | ORAL_TABLET | ORAL | Status: DC | PRN

## 2019-11-01 MED ORDER — SODIUM CHLORIDE 0.9 % IV SOLN
5.0000 10*6.[IU] | Freq: Once | INTRAVENOUS | Status: AC
  Administered 2019-11-01: 5 10*6.[IU] via INTRAVENOUS
  Filled 2019-11-01: qty 5

## 2019-11-01 MED ORDER — OXYTOCIN BOLUS FROM INFUSION
500.0000 mL | Freq: Once | INTRAVENOUS | Status: AC
  Administered 2019-11-02: 500 mL via INTRAVENOUS

## 2019-11-01 MED ORDER — FENTANYL CITRATE (PF) 100 MCG/2ML IJ SOLN
50.0000 ug | INTRAMUSCULAR | Status: DC | PRN
  Administered 2019-11-01 (×3): 100 ug via INTRAVENOUS
  Administered 2019-11-01: 50 ug via INTRAVENOUS
  Filled 2019-11-01 (×4): qty 2

## 2019-11-01 MED ORDER — HYDRALAZINE HCL 20 MG/ML IJ SOLN: 10.0000 mg | Freq: Once | INTRAMUSCULAR | Status: DC

## 2019-11-01 MED ORDER — DIPHENHYDRAMINE HCL 50 MG/ML IJ SOLN: 12.5000 mg | INTRAMUSCULAR | Status: DC | PRN

## 2019-11-01 MED ORDER — EPHEDRINE 5 MG/ML INJ: 10.0000 mg | INTRAVENOUS | Status: DC | PRN

## 2019-11-01 MED ORDER — HYDRALAZINE HCL 20 MG/ML IJ SOLN
10.0000 mg | INTRAMUSCULAR | Status: DC | PRN
  Filled 2019-11-01: qty 1

## 2019-11-01 MED ORDER — LABETALOL HCL 200 MG PO TABS
600.0000 mg | ORAL_TABLET | Freq: Three times a day (TID) | ORAL | Status: DC
  Administered 2019-11-01 – 2019-11-02 (×3): 600 mg via ORAL
  Filled 2019-11-01 (×4): qty 3

## 2019-11-01 MED ORDER — OXYCODONE-ACETAMINOPHEN 5-325 MG PO TABS: 1.0000 | ORAL_TABLET | ORAL | Status: DC | PRN

## 2019-11-01 MED ORDER — FENTANYL-BUPIVACAINE-NACL 0.5-0.125-0.9 MG/250ML-% EP SOLN
12.0000 mL/h | EPIDURAL | Status: DC | PRN
  Filled 2019-11-01: qty 250

## 2019-11-01 MED ORDER — LIDOCAINE HCL (PF) 1 % IJ SOLN: 30.0000 mL | INTRAMUSCULAR | Status: DC | PRN

## 2019-11-01 MED ORDER — LABETALOL HCL 5 MG/ML IV SOLN
40.0000 mg | INTRAVENOUS | Status: DC | PRN
  Administered 2019-11-01: 40 mg via INTRAVENOUS
  Filled 2019-11-01 (×2): qty 8

## 2019-11-01 MED ORDER — LACTATED RINGERS IV SOLN
INTRAVENOUS | Status: DC
  Administered 2019-11-01 (×2): via INTRAVENOUS

## 2019-11-01 MED ORDER — LABETALOL HCL 5 MG/ML IV SOLN: 80.0000 mg | INTRAVENOUS | Status: DC | PRN

## 2019-11-01 MED ORDER — LIDOCAINE HCL (PF) 1 % IJ SOLN
INTRAMUSCULAR | Status: DC | PRN
  Administered 2019-11-01: 2 mL via EPIDURAL
  Administered 2019-11-01: 10 mL via EPIDURAL

## 2019-11-01 MED ORDER — LABETALOL HCL 5 MG/ML IV SOLN
40.0000 mg | INTRAVENOUS | Status: DC | PRN
  Administered 2019-11-02: 40 mg via INTRAVENOUS

## 2019-11-01 MED ORDER — TERBUTALINE SULFATE 1 MG/ML IJ SOLN: 0.2500 mg | Freq: Once | INTRAMUSCULAR | Status: DC | PRN

## 2019-11-01 MED ORDER — LABETALOL HCL 5 MG/ML IV SOLN
20.0000 mg | INTRAVENOUS | Status: DC | PRN
  Administered 2019-11-02: 20 mg via INTRAVENOUS

## 2019-11-01 MED ORDER — ONDANSETRON HCL 4 MG/2ML IJ SOLN
4.0000 mg | Freq: Four times a day (QID) | INTRAMUSCULAR | Status: DC | PRN
  Administered 2019-11-01 – 2019-11-02 (×2): 4 mg via INTRAVENOUS
  Filled 2019-11-01 (×2): qty 2

## 2019-11-01 MED ORDER — LACTATED RINGERS IV SOLN: 500.0000 mL | INTRAVENOUS | Status: DC | PRN

## 2019-11-01 MED ORDER — MAGNESIUM SULFATE 40 GM/1000ML IV SOLN
2.0000 g/h | INTRAVENOUS | Status: DC
  Administered 2019-11-01 – 2019-11-02 (×2): 2 g/h via INTRAVENOUS
  Filled 2019-11-01 (×2): qty 1000

## 2019-11-01 MED ORDER — OXYTOCIN 40 UNITS IN NORMAL SALINE INFUSION - SIMPLE MED
1.0000 m[IU]/min | INTRAVENOUS | Status: DC
  Administered 2019-11-01: 2 m[IU]/min via INTRAVENOUS
  Administered 2019-11-02: 10 m[IU]/min via INTRAVENOUS
  Filled 2019-11-01: qty 1000

## 2019-11-01 NOTE — Progress Notes (Addendum)
Labor Progress Note Alicia Adkins is a 23 y.o. G1P0000 at [redacted]w[redacted]d, presented for IOL for cHTN with stable proteinuria. Patient's pregnancy was complicated by GBS+ and Chlamydia+ with TOC negative. Patient was treated for chlamydia with negative repeat test in 06/2019.   Subjective: Patient reported nausea and vomiting x 1. Zofran was given and patient reports feeling better at this time. Fentanyl also given for pain and patient reports improved pain at this time.  Patient denies any HA, blurry vision, or chest pain.  O:  BP (!) 144/83   Pulse 93   Temp 98.9 F (37.2 C) (Oral)   Resp 16   Ht 5\' 4"  (1.626 m)   Wt 122.8 kg   LMP 02/01/2019   BMI 46.47 kg/m    CVE: Dilation: 6.5 Effacement (%): 70 Cervical Position: Middle Station: -2 Presentation: Vertex Exam by:: bhambri cnm  Fetal monitoring : baseline 140, moderate variability, + accelerations, - decelerations Uterine activity : q3-66min  Results for orders placed or performed during the hospital encounter of 11/01/19 (from the past 48 hour(s))  CBC     Status: Abnormal   Collection Time: 11/01/19  1:07 AM  Result Value Ref Range   WBC 12.5 (H) 4.0 - 10.5 K/uL   RBC 3.61 (L) 3.87 - 5.11 MIL/uL   Hemoglobin 10.5 (L) 12.0 - 15.0 g/dL   HCT 13/10/20 (L) 56.2 - 56.3 %   MCV 87.5 80.0 - 100.0 fL   MCH 29.1 26.0 - 34.0 pg   MCHC 33.2 30.0 - 36.0 g/dL   RDW 89.3 73.4 - 28.7 %   Platelets 297 150 - 400 K/uL   nRBC 0.0 0.0 - 0.2 %    Comment: Performed at [facility], [address] [place], [place]  Comprehensive metabolic panel     Status: Abnormal   Collection Time: 11/01/19  1:07 AM  Result Value Ref Range   Sodium 136 135 - 145 mmol/L   Potassium 3.7 3.5 - 5.1 mmol/L   Chloride 109 98 - 111 mmol/L   CO2 19 (L) 22 - 32 mmol/L   Glucose, Bld 97 70 - 99 mg/dL   BUN 5 (L) 6 - 20 mg/dL   Creatinine, Ser 13/10/20 0.44 - 1.00 mg/dL   Calcium 9.0 8.9 - 2.03 mg/dL   Total Protein 6.3 (L) 6.5 - 8.1 g/dL   Albumin 2.5 (L) 3.5 - 5.0 g/dL   AST 20 15 - 41 U/L   ALT 17 0 - 44 U/L   Alkaline Phosphatase 100 38 - 126 U/L   Total Bilirubin 0.1 (L) 0.3 - 1.2 mg/dL   GFR calc non Af Amer >60 >60 mL/min   GFR calc Af Amer >60 >60 mL/min   Anion gap 8 5 - 15    Comment: Performed at [facility], [address], [place], [place]  RPR     Status: None   Collection Time: 11/01/19  1:07 AM  Result Value Ref Range   RPR Ser Ql NON REACTIVE NON REACTIVE    Comment: Performed at [facility], [address], [place], [place]  Type and screen     Status: None   Collection Time: 11/01/19  1:07 AM  Result Value Ref Range   ABO/RH(D) B POS    Antibody Screen NEG    Sample Expiration      11/04/2019,2359 Performed at [facility], [address] [place], [place]  [address]   ABO/Rh     Status: None   Collection Time: 11/01/19  1:07 AM  Result Value Ref Range   ABO/RH(D)      B POS Performed at [place] [address] [place], [place] [address]   Protein / creatinine ratio, urine     Status: Abnormal   Collection Time: 11/01/19  2:40 AM  Result Value Ref Range   Creatinine, Urine 124.23 mg/dL   Total Protein, Urine 51 mg/dL    Comment: NO NORMAL RANGE ESTABLISHED FOR THIS TEST   Protein Creatinine Ratio 0.41 (H) 0.00 - 0.15 mg/mg[Cre]    Comment: Performed at [place] [address], [place], [place] [address]  CBC     Status: Abnormal   Collection Time: 11/01/19 11:04 AM  Result Value Ref Range   WBC 18.6 (H) 4.0 - 10.5 K/uL   RBC 3.72 (L) 3.87 - 5.11 MIL/uL   Hemoglobin 10.7 (L) 12.0 - 15.0 g/dL   HCT 33.0 (L) 36.0 - 46.0 %   MCV 88.7 80.0 - 100.0 fL   MCH 28.8 26.0 - 34.0 pg   MCHC 32.4 30.0 - 36.0 g/dL   RDW 13.9 11.5 - 15.5 %   Platelets 306 150 - 400 K/uL   nRBC 0.0 0.0 - 0.2 %    Comment: Performed at [facility], [place] [address], [place], [place] [address]    A&P: 23 y.o. G1P0000 [redacted]w[redacted]d G1P000 with IUP presenting  for IOL for cHTN with stable proteinuria. Pregnancy complicated by GBS+, chlamydia with TOC negative and depression  #Labor: Progressing in latent labor. Patient s/p cyotec and FB. Continue pit at 8 mili units/min #Pain: Received fentanyl x1. IV pain medication and epidural upon request #FWB: Cat 1 #GBS : GBS +, penicillin q4 #cHTN : Patient's BP elevated to 160/98, 164/89 between 10:40 and 10:50. 40mg  Labetalol given and patient's BP is now 144/83 (11:54).  Discussed with the patient initiating Mag for seizure prevention. Patient agrees. Start IV Mg #depression : stable currently  Hessie Dibble, Student-PA 11:57 AM

## 2019-11-01 NOTE — Progress Notes (Signed)
Labor Progress Note Alicia Adkins is a 23 y.o. G1P0000 at [redacted]w[redacted]d presented for IOL for CHTN w/si PEC  S:  Uncomfortable with ctx. Had Fentanyl previously, would like another dose.  O:  BP (!) 141/71   Pulse (!) 104   Temp 98.7 F (37.1 C) (Oral)   Resp 20   Ht 5\' 4"  (1.626 m)   Wt 122.8 kg   LMP 02/01/2019   BMI 46.47 kg/m  EFM: baseline 135 bpm/ mod variability/ + accels/ no decels  Toco: q3 SVE: Dilation: 6.5 Effacement (%): 70 Cervical Position: Middle Station: -2 Presentation: Vertex Exam by:: Arla Boutwell, CNM  Pitocin: 22 mu/min  A/P: 23 y.o. G1P0000 [redacted]w[redacted]d  1. Labor: protracted 2. FWB: Cat I 3. Pain: analgesia/anesthesia prn 4. CHTN w/si severe PEC : required Labetalol earlier for severe range BP, continue MgSO4  Recommend AROM for protracted labor, pt agrees, AROM for clear fluid. Continue Pitocin. Anticipate labor progression and SVD.  [name], CNM 3:33 PM

## 2019-11-01 NOTE — Progress Notes (Signed)
LABOR PROGRESS NOTE  Alicia Adkins is a 23 y.o. female G1P0000 with IUP at [redacted]w[redacted]d by LMP presenting for IOL for cHTN with stable proteinuria. Pregnancy complicated by GBS+, Chlamydia with TOC negative, and depression   Subjective: Patient is doing well. Denies HA or vision changes.   Objective: BP (!) 141/78   Pulse 85   Temp 98.2 F (36.8 C) (Oral)   Resp 16   Ht 5\' 4"  (1.626 m)   Wt 122.8 kg   LMP 02/01/2019   BMI 46.47 kg/m  or  Vitals:   11/01/19 0440 11/01/19 0501 11/01/19 0602 11/01/19 0700  BP:  139/65 135/71 (!) 141/78  Pulse:  90 84 85  Resp:      Temp: 98.2 F (36.8 C)     TempSrc: Oral     Weight:      Height:       Dilation: 4.5 Effacement (%): 50 Cervical Position: Middle Station: -2 Presentation: Vertex Exam by:: K Faucett RN Fetal monitoring: Baseline 130, moderate variability, + accelerations, -decelerations  Uterine activity: q 4 min   Labs: Lab Results  Component Value Date   WBC 12.5 (H) 11/01/2019   HGB 10.5 (L) 11/01/2019   HCT 31.6 (L) 11/01/2019   MCV 87.5 11/01/2019   PLT 297 11/01/2019    Patient Active Problem List   Diagnosis Date Noted  . GBS (group B Streptococcus carrier), +RV culture, currently pregnant 10/26/2019  . Chronic hypertension affecting pregnancy 09/29/2019  . Chlamydia infection affecting pregnancy, antepartum 09/08/2019  . Hypertension 08/03/2019  . Supervision of high risk pregnancy, antepartum 05/04/2019  . Depression 08/04/2018    Assessment / Plan: Alicia Adkins is a 23 y.o. female G1P0000 with IUP at [redacted]w[redacted]d by LMP presenting for IOL for cHTN with stable proteinuria. Pregnancy complicated by GBS+, Chlamydia with TOC negative, and depression   #Labor:  Progressing in latent labor. She is s/p cyotec and FB. Initiating low dose pitocin  #Pain: IV Pain medication and epidural upon request  #FWB: Cat 1  #ID: [id number] +; Penicillin q 4  #MOD: VD   #Chronic Hypertension- Has had no further severe range  pressures or features since receiving labetalol. Continue to trend.  #Depression- Currently stable and on no medication  Maxie Better, PGY-1, MD OBGYN Faculty Teaching Service  11/01/2019, 7:28 AM

## 2019-11-01 NOTE — Progress Notes (Addendum)
LABOR PROGRESS NOTE  Alicia Adkins is a 23 y.o. female G1P0000 with IUP at [redacted]w[redacted]d by LMP presenting for IOL for cHTN with stable proteinuria. Pregnancy complicated by GBS+, Chlamydia with TOC negative, and depression   Subjective: Patient reports doing well. Experiencing back pain but reports tolerable. Patient expresses she would like to hold off on pain management as much as possible. No nausea or vomiting. Denies any HA, blurry vision, or chest pain.     Objective: BP 117/62   Pulse 78   Temp 98.9 F (37.2 C) (Oral)   Resp 16   Ht 5\' 4"  (1.626 m)   Wt 122.8 kg   LMP 02/01/2019   BMI 46.47 kg/m  or  Vitals:   11/01/19 0735 11/01/19 0809 11/01/19 0832 11/01/19 0901  BP: (!) 149/78 137/76 (!) 151/87 117/62  Pulse: 84 87 87 78  Resp: 16     Temp:    98.9 F (37.2 C)  TempSrc:    Oral  Weight:      Height:       Dilation: 4.5 Effacement (%): 50 Cervical Position: Middle Station: -2 Presentation: Vertex Exam by:: K Faucett RN Fetal monitoring: Baseline 140, moderate variability, + accelerations, -decelerations  Uterine activity: q 4 min   Labs: Lab Results  Component Value Date   WBC 12.5 (H) 11/01/2019   HGB 10.5 (L) 11/01/2019   HCT 31.6 (L) 11/01/2019   MCV 87.5 11/01/2019   PLT 297 11/01/2019    Patient Active Problem List   Diagnosis Date Noted  . GBS (group B Streptococcus carrier), +RV culture, currently pregnant 10/26/2019  . Chronic hypertension affecting pregnancy 09/29/2019  . Chlamydia infection affecting pregnancy, antepartum 09/08/2019  . Hypertension 08/03/2019  . Supervision of high risk pregnancy, antepartum 05/04/2019  . Depression 08/04/2018    Assessment / Plan: JELESA Adkins is a 23 y.o. female G1P0000 with IUP at [redacted]w[redacted]d by LMP presenting for IOL for cHTN with stable proteinuria. Pregnancy complicated by GBS+, Chlamydia with TOC negative, and depression   #Labor:  Progressing in latent labor. She is s/p cyotec and FB.  Continuous Pit at 8 milli units/min. #Pain: IV Pain medication and epidural upon request  #FWB: Cat 1  #ID: [id number] +; Penicillin q 4  #MOD: VD   #Chronic Hypertension- Has had no further severe range pressures or features since receiving labetalol. Continue to trend.  #Depression- Currently stable and on no medication  Hessie Dibble, PA-S2 11/01/2019, 9:32 AM

## 2019-11-01 NOTE — Anesthesia Preprocedure Evaluation (Signed)
Anesthesia Evaluation  Patient identified by MRN, date of birth, ID band Patient awake    Reviewed: Allergy & Precautions, NPO status , Patient's Chart, lab work & pertinent test results, reviewed documented beta blocker date and time   Airway Mallampati: II  TM Distance: >3 FB Neck ROM: Full    Dental no notable dental hx.    Pulmonary neg pulmonary ROS,    Pulmonary exam normal breath sounds clear to auscultation       Cardiovascular hypertension, Normal cardiovascular exam Rhythm:Regular Rate:Normal  HTN no home meds- has been very difficult to control during labor   Neuro/Psych PSYCHIATRIC DISORDERS Depression negative neurological ROS     GI/Hepatic negative GI ROS, Neg liver ROS,   Endo/Other  Morbid obesityBMI 47  Renal/GU negative Renal ROS  negative genitourinary   Musculoskeletal negative musculoskeletal ROS (+)   Abdominal   Peds negative pediatric ROS (+)  Hematology negative hematology ROS (+)   Anesthesia Other Findings   Reproductive/Obstetrics (+) Pregnancy                             Anesthesia Physical Anesthesia Plan  ASA: III and emergent  Anesthesia Plan: Epidural   Post-op Pain Management:    Induction:   PONV Risk Score and Plan: 2  Airway Management Planned: Natural Airway  Additional Equipment: None  Intra-op Plan:   Post-operative Plan:   Informed Consent: I have reviewed the patients History and Physical, chart, labs and discussed the procedure including the risks, benefits and alternatives for the proposed anesthesia with the patient or authorized representative who has indicated his/her understanding and acceptance.       Plan Discussed with:   Anesthesia Plan Comments:         Anesthesia Quick Evaluation

## 2019-11-01 NOTE — Anesthesia Procedure Notes (Signed)
Epidural Patient location during procedure: OB Start time: 11/01/2019 6:39 PM End time: 11/01/2019 6:50 PM  Staffing Anesthesiologist: Pervis Hocking, DO Performed: anesthesiologist   Preanesthetic Checklist Completed: patient identified, pre-op evaluation, timeout performed, IV checked, risks and benefits discussed and monitors and equipment checked  Epidural Patient position: sitting Prep: site prepped and draped and DuraPrep Patient monitoring: continuous pulse ox, blood pressure, heart rate and cardiac monitor Approach: midline Location: L3-L4 Injection technique: LOR air  Needle:  Needle type: Tuohy  Needle gauge: 17 G Needle length: 9 cm Needle insertion depth: 6 cm Catheter type: closed end flexible Catheter size: 19 Gauge Catheter at skin depth: 11 cm Test dose: negative  Assessment Sensory level: T8 Events: blood not aspirated, injection not painful, no injection resistance, negative IV test and no paresthesia  Additional Notes Patient identified. Risks/Benefits/Options discussed with patient including but not limited to bleeding, infection, nerve damage, paralysis, failed block, incomplete pain control, headache, blood pressure changes, nausea, vomiting, reactions to medication both or allergic, itching and postpartum back pain. Confirmed with bedside nurse the patient's most recent platelet count. Confirmed with patient that they are not currently taking any anticoagulation, have any bleeding history or any family history of bleeding disorders. Patient expressed understanding and wished to proceed. All questions were answered. Sterile technique was used throughout the entire procedure. Please see nursing notes for vital signs. Test dose was given through epidural catheter and negative prior to continuing to dose epidural or start infusion. Warning signs of high block given to the patient including shortness of breath, tingling/numbness in hands, complete motor  block, or any concerning symptoms with instructions to call for help. Patient was given instructions on fall risk and not to get out of bed. All questions and concerns addressed with instructions to call with any issues or inadequate analgesia.  Reason for block:procedure for pain

## 2019-11-01 NOTE — H&P (Addendum)
LABOR AND DELIVERY ADMISSION HISTORY AND PHYSICAL NOTE  Alicia Adkins is a 23 y.o. female G1P0000 with IUP at [redacted]w[redacted]d by LMP presenting for IOL for cHTN with stable proteinuria. Pregnancy complicated by GBS+, Chlamydia with TOC negative, and depression   She reports positive fetal movement. She denies leakage of fluid or vaginal bleeding. Denies headaches, vision changes, dyspnea, or lower extremity edema.   Prenatal History/Complications: cHTN-Elevated BP since 2017. 8/26- 24 hours protein 500 mg and 9/11 431 mg which have remained baseline GBS+  Depression- Not on medication  Chlamydia - Initial prenatal + with TOC 7/15 negative.   Past Medical History: Past Medical History:  Diagnosis Date  . Depression   . Medical history non-contributory   . Trichimoniasis 08/06/2018   +trich on pap, treated with flagyl, POC 8/26___    Past Surgical History: Past Surgical History:  Procedure Laterality Date  . WISDOM TOOTH EXTRACTION      Obstetrical History: OB History    Gravida  1   Para  0   Term  0   Preterm  0   AB  0   Living  0     SAB  0   TAB  0   Ectopic  0   Multiple  0   Live Births  0           Social History: Social History   Socioeconomic History  . Marital status: Single    Spouse name: Not on file  . Number of children: 1  . Years of education: 52  . Highest education level: High school graduate  Occupational History  . Not on file  Social Needs  . Financial resource strain: Not hard at all  . Food insecurity    Worry: Never true    Inability: Never true  . Transportation needs    Medical: No    Non-medical: No  Tobacco Use  . Smoking status: Never Smoker  . Smokeless tobacco: Never Used  Substance and Sexual Activity  . Alcohol use: Not Currently    Comment: socially   . Drug use: Not Currently    Types: Marijuana  . Sexual activity: Yes    Birth control/protection: None  Lifestyle  . Physical activity    Days per  week: 2 days    Minutes per session: 30 min  . Stress: Only a little  Relationships  . Social connections    Talks on phone: More than three times a week    Gets together: More than three times a week    Attends religious service: 1 to 4 times per year    Active member of club or organization: No    Attends meetings of clubs or organizations: Never    Relationship status: Never married  Other Topics Concern  . Not on file  Social History Narrative  . Not on file    Family History: Family History  Problem Relation Age of Onset  . Heart attack Paternal Grandfather   . Hypertension Maternal Grandmother   . Cancer Mother        cervical  . Diabetes Mother   . Hypertension Mother     Allergies: No Known Allergies  Medications Prior to Admission  Medication Sig Dispense Refill Last Dose  . aspirin EC 81 MG tablet Take 2 tablets (162 mg total) by mouth daily. 60 tablet 6   . Blood Pressure Monitor MISC For regular home bp monitoring during pregnancy 1 each 0   .  prenatal vitamin w/FE, FA (PRENATAL 1 + 1) 27-1 MG TABS tablet Take 1 tablet by mouth daily at 12 noon. 30 each 12      Review of Systems   All systems reviewed and negative except as stated in HPI  Last menstrual period 02/01/2019, unknown if currently breastfeeding. General appearance: alert Lungs: clear to auscultation bilaterally Heart: regular rate and rhythm Abdomen: soft, non-tender; bowel sounds normal Extremities: No calf swelling or tenderness Presentation: cephalic sutures palpable  Fetal monitoring: Baseline 134, moderate variability, + accelerations, -decelerations  Uterine activity: q 4-5 min      Prenatal labs: ABO, Rh: B/Positive/-- (05/13 1123) Antibody: Negative (08/12 0854) Rubella: 5.36 (05/13 1123) RPR: Non Reactive (08/12 0854)  HBsAg: Negative (05/13 1123)  HIV: Non Reactive (08/12 0854)  GBS: --Lottie Dawson (10/22 1654)  1 hr Glucola: WNL  Genetic screening:  WNL  Anatomy US:  10/22 WNL EFW 3143 and 76%   Prenatal Transfer Tool  Maternal Diabetes: No Genetic Screening: Normal Maternal Ultrasounds/Referrals: Normal Fetal Ultrasounds or other Referrals:  None, Referred to Materal Fetal Medicine  Maternal Substance Abuse:  No Significant Maternal Medications:  None Significant Maternal Lab Results: Group B Strep positive  Results for orders placed or performed in visit on 10/31/19 (from the past 24 hour(s))  POC Urinalysis Dipstick OB   Collection Time: 10/31/19 11:46 AM  Result Value Ref Range   Color, UA     Clarity, UA     Glucose, UA Negative Negative   Bilirubin, UA     Ketones, UA neg    Spec Grav, UA     Blood, UA neg    pH, UA     POC,PROTEIN,UA Small (1+) Negative, Trace, Small (1+), Moderate (2+), Large (3+), 4+   Urobilinogen, UA     Nitrite, UA neg    Leukocytes, UA Negative Negative   Appearance     Odor      Patient Active Problem List   Diagnosis Date Noted  . GBS (group B Streptococcus carrier), +RV culture, currently pregnant 10/26/2019  . Chronic hypertension affecting pregnancy 09/29/2019  . Chlamydia infection affecting pregnancy, antepartum 09/08/2019  . Hypertension 08/03/2019  . Supervision of high risk pregnancy, antepartum 05/04/2019  . Depression 08/04/2018    Assessment:  JUNA CABAN is a 23 y.o. female G1P0000 with IUP at [redacted]w[redacted]d by LMP presenting for IOL for cHTN with stable proteinuria. Pregnancy complicated by GBS+, Chlamydia with TOC negative, and depression   #Labor: Will proceed with induction of labor. Plan for cervical ripening with cytotec buccally and foley balloon. #Pain: IV  Pain medication and epidural upon request  #FWB: Cat 1  #ID: GBS + will require ppx  #MOF: both  #MOC:POP vs nexplanon? #Circ: Outpatient   #Chronic Hypertension- Hx of hypertension since 2017 has had stable proteinuria through out pregnancy. She presents with no severe features at this time. Ordering Pre-labs and continue  to monitor BP.  #Depression- Currently stable and on no medication #Chlamydia - Initial prenatal + with TOC 7/15 negative.   Curly [place], MD, PGY-1  OBGYN Faculty Teaching Service  11/01/2019, 12:27 AM  OB FELLOW HISTORY AND PHYSICAL ATTESTATION  I have seen and examined this patient; I agree with above documentation in the resident's note.   Marcy Siren, D.O. OB Fellow  11/01/2019, 8:04 AM

## 2019-11-01 NOTE — Progress Notes (Signed)
LABOR PROGRESS NOTE   Alicia Adkins is a 23 y.o. G1P0000 at [redacted]w[redacted]d presented for IOL for [facility] w/si PEC  Subjective: Patient doing well. Denies HA, vision changes, or dyspnea   Objective: BP (!) 147/67   Pulse (!) 104   Temp 97.8 F (36.6 C) (Oral)   Resp 17   Ht 5\' 4"  (1.626 m)   Wt 122.8 kg   LMP 02/01/2019   SpO2 100%   BMI 46.47 kg/m  or  Vitals:   11/01/19 2001 11/01/19 2030 11/01/19 2101 11/01/19 2131  BP: (!) 149/76 (!) 153/72 (!) 141/71 (!) 147/67  Pulse: (!) 114 (!) 104 98 (!) 104  Resp: 18  17   Temp:   97.8 F (36.6 C)   TempSrc:   Oral   SpO2:      Weight:      Height:        Dilation: 7 Effacement (%): 70 Cervical Position: Middle Station: -1 Presentation: Vertex Exam by:: Ulla Gallo FHT: baseline rate 125, moderate varibility, +acel, -decel Toco: q2-3 min, MVU 80   Labs: Lab Results  Component Value Date   WBC 18.6 (H) 11/01/2019   HGB 10.7 (L) 11/01/2019   HCT 33.0 (L) 11/01/2019   MCV 88.7 11/01/2019   PLT 306 11/01/2019    Patient Active Problem List   Diagnosis Date Noted  . GBS (group B Streptococcus carrier), +RV culture, currently pregnant 10/26/2019  . Chronic hypertension affecting pregnancy 09/29/2019  . Chlamydia infection affecting pregnancy, antepartum 09/08/2019  . Hypertension 08/03/2019  . Supervision of high risk pregnancy, antepartum 05/04/2019  . Depression 08/04/2018    Assessment / Plan: Alicia Adkins is a 23 y.o. G1P0000 at [redacted]w[redacted]d presented for IOL for [facility] w/si PEC  #Labor: Protracted labor has made no significant changed since 0134 today. She is s/p FB and AROM. IUPC placed on this exam. Pitocin at 30 u will continue augmentation.  #Fetal Wellbeing:  Cat 1 #Pain Control:  IV PRN  #ID: GBS + treated with prophylaxis  #Anticipated MOD: SVD  #cHTN w/ superimposed severe Pre-E : BP have remained in moderate range and has no severe features at this time.  -Mag infusion running  -Labetalol protocol IV  and scheduled PO   Maxie Better, PGY-1, MD OBGYN Faculty Teaching Service  11/01/2019, 9:57 PM

## 2019-11-01 NOTE — Progress Notes (Signed)
LABOR PROGRESS NOTE  Alicia Adkins is a 23 y.o. female G1P0000 with IUP at [redacted]w[redacted]d by LMP presenting for IOL for cHTN with stable proteinuria. Pregnancy complicated by GBS+, Chlamydia with TOC negative, and depression   Subjective: Patient is doing well. Denies HA or vision changes.   Objective: BP (!) 169/93   Pulse 93   Temp 98.3 F (36.8 C) (Oral)   Resp 16   Ht 5\' 4"  (1.626 m)   Wt 122.8 kg   LMP 02/01/2019   BMI 46.47 kg/m  or  Vitals:   11/01/19 0036 11/01/19 0146 11/01/19 0201 11/01/19 0216  BP: (!) 151/78 (!) 150/86 (!) 160/100 (!) 169/93  Pulse: 89 94 81 93  Resp: 16 16    Temp:  98.3 F (36.8 C)    TempSrc:  Oral    Weight: 122.8 kg     Height: 5\' 4"  (1.626 m)      Dilation: 1.5 Effacement (%): 50 Cervical Position: Posterior Station: -2 Presentation: Vertex Exam by:: Dr. Jones Bales Fetal monitoring: Baseline 134, moderate variability, + accelerations, -decelerations  Uterine activity: q 4 min   Labs: Lab Results  Component Value Date   WBC 12.5 (H) 11/01/2019   HGB 10.5 (L) 11/01/2019   HCT 31.6 (L) 11/01/2019   MCV 87.5 11/01/2019   PLT 297 11/01/2019    Patient Active Problem List   Diagnosis Date Noted  . GBS (group B Streptococcus carrier), +RV culture, currently pregnant 10/26/2019  . Chronic hypertension affecting pregnancy 09/29/2019  . Chlamydia infection affecting pregnancy, antepartum 09/08/2019  . Hypertension 08/03/2019  . Supervision of high risk pregnancy, antepartum 05/04/2019  . Depression 08/04/2018    Assessment / Plan: Alicia Adkins is a 23 y.o. female G1P0000 with IUP at [redacted]w[redacted]d by LMP presenting for IOL for cHTN with stable proteinuria. Pregnancy complicated by GBS+, Chlamydia with TOC negative, and depression   #Labor:  s/p cyotec and FB. Continue augmentation with Cytotec q4.  #Pain: IV Pain medication and epidural upon request  #FWB: Cat 1  #ID: GBS +; Penicillin q 4  #MOD: VD   #Chronic Hypertension- Has had  2 severe ranged 15 min apart. CBC and CMP WNL. Presents with no other severe features. Starting labetalol protocol. Start Mag if another severe ranged pressur #Depression- Currently stable and on no medication  Maxie Better, PGY-1, MD OBGYN Faculty Teaching Service  11/01/2019, 2:24 AM

## 2019-11-02 ENCOUNTER — Encounter (HOSPITAL_COMMUNITY): Payer: Self-pay | Admitting: *Deleted

## 2019-11-02 DIAGNOSIS — Z3A39 39 weeks gestation of pregnancy: Secondary | ICD-10-CM

## 2019-11-02 DIAGNOSIS — I10 Essential (primary) hypertension: Secondary | ICD-10-CM

## 2019-11-02 DIAGNOSIS — O1002 Pre-existing essential hypertension complicating childbirth: Secondary | ICD-10-CM

## 2019-11-02 DIAGNOSIS — O99824 Streptococcus B carrier state complicating childbirth: Secondary | ICD-10-CM

## 2019-11-02 LAB — CBC
HCT: 31.9 % — ABNORMAL LOW (ref 36.0–46.0)
Hemoglobin: 10.4 g/dL — ABNORMAL LOW (ref 12.0–15.0)
MCH: 28.9 pg (ref 26.0–34.0)
MCHC: 32.6 g/dL (ref 30.0–36.0)
MCV: 88.6 fL (ref 80.0–100.0)
Platelets: 234 10*3/uL (ref 150–400)
RBC: 3.6 MIL/uL — ABNORMAL LOW (ref 3.87–5.11)
RDW: 14.5 % (ref 11.5–15.5)
WBC: 25.5 10*3/uL — ABNORMAL HIGH (ref 4.0–10.5)
nRBC: 0 % (ref 0.0–0.2)

## 2019-11-02 MED ORDER — MISOPROSTOL 200 MCG PO TABS
400.0000 ug | ORAL_TABLET | Freq: Once | ORAL | Status: AC
  Administered 2019-11-02: 400 ug via BUCCAL

## 2019-11-02 MED ORDER — COCONUT OIL OIL: 1.0000 "application " | TOPICAL_OIL | Status: DC | PRN

## 2019-11-02 MED ORDER — TRANEXAMIC ACID-NACL 1000-0.7 MG/100ML-% IV SOLN
1000.0000 mg | INTRAVENOUS | Status: AC
  Administered 2019-11-02: 1000 mg via INTRAVENOUS

## 2019-11-02 MED ORDER — LACTATED RINGERS AMNIOINFUSION
INTRAVENOUS | Status: DC
  Administered 2019-11-02 (×2): via INTRAUTERINE
  Filled 2019-11-02: qty 1000

## 2019-11-02 MED ORDER — MISOPROSTOL 200 MCG PO TABS
ORAL_TABLET | ORAL | Status: AC
  Filled 2019-11-02: qty 2

## 2019-11-02 MED ORDER — TRANEXAMIC ACID-NACL 1000-0.7 MG/100ML-% IV SOLN
INTRAVENOUS | Status: AC
  Filled 2019-11-02: qty 100

## 2019-11-02 MED ORDER — DIBUCAINE (PERIANAL) 1 % EX OINT: 1.0000 "application " | TOPICAL_OINTMENT | CUTANEOUS | Status: DC | PRN

## 2019-11-02 MED ORDER — LABETALOL HCL 5 MG/ML IV SOLN: 80.0000 mg | INTRAVENOUS | Status: DC | PRN

## 2019-11-02 MED ORDER — ONDANSETRON HCL 4 MG/2ML IJ SOLN: 4.0000 mg | INTRAMUSCULAR | Status: DC | PRN

## 2019-11-02 MED ORDER — BENZOCAINE-MENTHOL 20-0.5 % EX AERO
1.0000 "application " | INHALATION_SPRAY | CUTANEOUS | Status: DC | PRN
  Administered 2019-11-02 – 2019-11-04 (×2): 1 via TOPICAL
  Filled 2019-11-02 (×2): qty 56

## 2019-11-02 MED ORDER — TETANUS-DIPHTH-ACELL PERTUSSIS 5-2.5-18.5 LF-MCG/0.5 IM SUSP: 0.5000 mL | Freq: Once | INTRAMUSCULAR | Status: DC

## 2019-11-02 MED ORDER — SIMETHICONE 80 MG PO CHEW: 80.0000 mg | CHEWABLE_TABLET | ORAL | Status: DC | PRN

## 2019-11-02 MED ORDER — LABETALOL HCL 5 MG/ML IV SOLN: 40.0000 mg | INTRAVENOUS | Status: DC | PRN

## 2019-11-02 MED ORDER — LACTATED RINGERS IV SOLN
INTRAVENOUS | Status: AC
  Administered 2019-11-02: 21:00:00 via INTRAVENOUS

## 2019-11-02 MED ORDER — DIPHENHYDRAMINE HCL 25 MG PO CAPS: 25.0000 mg | ORAL_CAPSULE | Freq: Four times a day (QID) | ORAL | Status: DC | PRN

## 2019-11-02 MED ORDER — TRANEXAMIC ACID-NACL 1000-0.7 MG/100ML-% IV SOLN
1000.0000 mg | Freq: Once | INTRAVENOUS | Status: DC
  Filled 2019-11-02: qty 100

## 2019-11-02 MED ORDER — SENNOSIDES-DOCUSATE SODIUM 8.6-50 MG PO TABS
2.0000 | ORAL_TABLET | ORAL | Status: DC
  Administered 2019-11-02 – 2019-11-03 (×2): 2 via ORAL
  Filled 2019-11-02 (×2): qty 2

## 2019-11-02 MED ORDER — LACTATED RINGERS IV SOLN: INTRAVENOUS | Status: DC

## 2019-11-02 MED ORDER — MAGNESIUM SULFATE 40 GM/1000ML IV SOLN
2.0000 g/h | INTRAVENOUS | Status: AC
  Administered 2019-11-03: 2 g/h via INTRAVENOUS
  Filled 2019-11-02: qty 1000

## 2019-11-02 MED ORDER — LABETALOL HCL 5 MG/ML IV SOLN: 20.0000 mg | INTRAVENOUS | Status: DC | PRN

## 2019-11-02 MED ORDER — MAGNESIUM SULFATE 40 GM/1000ML IV SOLN: 2.0000 g/h | INTRAVENOUS | Status: DC

## 2019-11-02 MED ORDER — WITCH HAZEL-GLYCERIN EX PADS: 1.0000 "application " | MEDICATED_PAD | CUTANEOUS | Status: DC | PRN

## 2019-11-02 MED ORDER — PRENATAL MULTIVITAMIN CH
1.0000 | ORAL_TABLET | Freq: Every day | ORAL | Status: DC
  Administered 2019-11-03 – 2019-11-04 (×2): 1 via ORAL
  Filled 2019-11-02 (×2): qty 1

## 2019-11-02 MED ORDER — ZOLPIDEM TARTRATE 5 MG PO TABS: 5.0000 mg | ORAL_TABLET | Freq: Every evening | ORAL | Status: DC | PRN

## 2019-11-02 MED ORDER — ACETAMINOPHEN 325 MG PO TABS
650.0000 mg | ORAL_TABLET | ORAL | Status: DC | PRN
  Administered 2019-11-04: 650 mg via ORAL
  Filled 2019-11-02: qty 2

## 2019-11-02 MED ORDER — IBUPROFEN 600 MG PO TABS
600.0000 mg | ORAL_TABLET | Freq: Four times a day (QID) | ORAL | Status: DC
  Administered 2019-11-02 – 2019-11-04 (×8): 600 mg via ORAL
  Filled 2019-11-02 (×9): qty 1

## 2019-11-02 MED ORDER — ONDANSETRON HCL 4 MG PO TABS: 4.0000 mg | ORAL_TABLET | ORAL | Status: DC | PRN

## 2019-11-02 NOTE — Progress Notes (Signed)
Patient ID: Alicia Adkins, female   DOB: Oct 06, 1996, 23 y.o.   MRN: [id number] IUPC adjusted Vitals:   11/02/19 0405 11/02/19 0410 11/02/19 0430 11/02/19 0500  BP: (!) 100/42 112/83 119/67 140/85  Pulse: 93 82 (!) 102 98  Resp:    20  Temp: 98 F (36.7 C)     TempSrc: Oral     SpO2:      Weight:      Height:       Vaginal discharge foul odor now Dilation: 9 Effacement (%): 100 Cervical Position: Anterior Station: Plus 1 Presentation: Vertex Exam by:: Williams CNM  FHR nonreactive but with average variability, Category 2  Will anticipate Second Stage soon

## 2019-11-02 NOTE — Discharge Summary (Signed)
Postpartum Discharge Summary Patient Name: Alicia Adkins DOB: 1996-09-21 MRN: [id number]  Date of admission: 11/01/2019 Delivering Provider: Caren Macadam   Date of discharge: 11/04/2019  Admitting diagnosis: Pregnancy at 101/0. IOL for cHTN   Secondary diagnosis: GBS positive, h/o chlamydia with negative test of cure, depression.     Discharge diagnosis: Term Pregnancy Delivered and CHTN with superimposed preeclampsia     Augmentation: AROM, Pitocin, Cytotec and Foley Balloon  Complications: None  Hospital course:  Patient had an uncomplicated labor course as follows: Patient arrived at 1.5 cm dilation and was induced initially with misprostol and foley balloon. Reached 4cm and augmented with pitocin and AROM. During course of labor had multiple severe range pressures requiring IV antihypertensives and was diagnosed with cHTN with superimposed PreE by BP and started on Mg. Reached 10cm but had prolonged bradycardia lasting 5 minutes with nadir in 60's which recovered but subsequently had further decelerations with pushing. Found to be at +3-4 station and decision made to proceed with VAVD. Membrane Rupture Time/Date: 3:30 PM ,11/01/2019   Intrapartum Procedures: Episiotomy: None [1]                                         Lacerations:  2nd degree [3]  Patient had delivery of a Viable infant.  Information for the patient's newborn:  Julius, Boniface [[id number]]  Delivery Method: Vag-Spont    11/02/2019  Details of delivery can be found in separate delivery note.    Patient had a routine postpartum course. She had 24 hours of PP Mg and was started on norvasc. Patient is discharged home 11/04/19.  Magnesium Sulfate received: Yes BMZ received: No Rhophylac:N/A MMR:N/A Transfusion:No  Physical exam  Vitals:   11/03/19 2024 11/03/19 2356 11/04/19 0458 11/04/19 0835  BP: 136/68 132/70 133/68 137/82  Pulse: 95 (!) 101 96 (!) 104  Resp: '18 18 18 18  '$ Temp:  98.2 F (36.8 C) 98.3 F (36.8 C) 98.1 F (36.7 C) 98.4 F (36.9 C)  TempSrc: Oral Oral Oral Oral  SpO2: 100% 100% 100% 98%  Weight:      Height:       General: alert Lochia: appropriate Uterine Fundus: firm, nttp  Labs: Lab Results  Component Value Date   WBC 18.5 (H) 11/03/2019   HGB 8.6 (L) 11/03/2019   HCT 26.9 (L) 11/03/2019   MCV 88.8 11/03/2019   PLT 267 11/03/2019   CMP Latest Ref Rng & Units 11/03/2019  Glucose 70 - 99 mg/dL 104(H)  BUN 6 - 20 mg/dL 8  Creatinine 0.44 - 1.00 mg/dL 0.70  Sodium 135 - 145 mmol/L 139  Potassium 3.5 - 5.1 mmol/L 3.7  Chloride 98 - 111 mmol/L 109  CO2 22 - 32 mmol/L 21(L)  Calcium 8.9 - 10.3 mg/dL 7.6(L)  Total Protein 6.5 - 8.1 g/dL 5.5(L)  Total Bilirubin 0.3 - 1.2 mg/dL <0.1(L)  Alkaline Phos 38 - 126 U/L 79  AST 15 - 41 U/L 21  ALT 0 - 44 U/L 16    Discharge instruction: per After Visit Summary and "Baby and Me Booklet".  After visit meds:  Allergies as of 11/04/2019   No Known Allergies     Medication List    STOP taking these medications   aspirin EC 81 MG tablet     TAKE these medications   acetaminophen 500 MG tablet Commonly known as: TYLENOL  Take 500 mg by mouth every 6 (six) hours as needed for mild pain or headache.   amLODipine 5 MG tablet Commonly known as: NORVASC Take 1 tablet (5 mg total) by mouth daily.   benzocaine-Menthol 20-0.5 % Aero Commonly known as: DERMOPLAST Apply 1 application topically as needed for irritation (perineal discomfort).   Blood Pressure Monitor Misc For regular home bp monitoring during pregnancy   calcium carbonate 500 MG chewable tablet Commonly known as: TUMS - dosed in mg elemental calcium Chew 1 tablet by mouth as needed for indigestion or heartburn.   ibuprofen 600 MG tablet Commonly known as: ADVIL Take 1 tablet (600 mg total) by mouth every 6 (six) hours as needed for headache, mild pain or cramping.   prenatal vitamin w/FE, FA 27-1 MG Tabs tablet Take 1  tablet by mouth daily at 12 noon.   simethicone 80 MG chewable tablet Commonly known as: MYLICON Chew 1 tablet (80 mg total) by mouth as needed for flatulence.       Diet: routine diet  Activity: Advance as tolerated. Pelvic rest for 6 weeks.   Outpatient follow up:6 weeks Follow up Appt: Future Appointments  Date Time Provider [place]  12/08/2019  2:10 PM Cresenzo-Dishmon, Joaquim Lai, CNM [facility]   Follow up Visit: Follow-up Information    [facility]. Schedule an appointment as soon as possible for a visit in 4 day(s).   Specialty: Obstetrics and Gynecology Why: a blood pressure check visit was requested for you for late next week. if you havne't heard from the office by 11/17, call the office.  Contact information: [address] [place] [phone]           Please schedule this patient for Postpartum visit in: 6 weeks with the following provider: Any provider  For C/S patients schedule nurse incision check in weeks 2 weeks: no  High risk pregnancy complicated by: cHTN with superimposed pre-eclampsia  Delivery mode: Vacuum  Anticipated Birth Control: other/unsure  PP Procedures needed: BP check  Schedule Integrated BH visit: no     Newborn Data: Live born female  Birth Weight: 7 lb 3.3 oz (3270 g) APGAR: 6, 8  Newborn Delivery   Birth date/time: 11/02/2019 10:43:00 Delivery type: Vaginal, Vacuum (Extractor)      Baby Feeding: Bottle and Breast Disposition:home with mother   11/04/2019 Aletha Halim, MD

## 2019-11-02 NOTE — Progress Notes (Signed)
LABOR PROGRESS NOTE   Alicia Adkins is a 23 y.o. G1P0000 at [redacted]w[redacted]d presented for IOL for [facility] w/si PEC  Subjective: Patient doing well. Denies HA, vision changes, or dyspnea   Objective: BP 139/79   Pulse 97   Temp 97.8 F (36.6 C) (Oral)   Resp 16   Ht 5\' 4"  (1.626 m)   Wt 122.8 kg   LMP 02/01/2019   SpO2 100%   BMI 46.47 kg/m  or  Vitals:   11/02/19 0100 11/02/19 0130 11/02/19 0200 11/02/19 0230  BP: 123/65 118/73 123/71 139/79  Pulse: 94 94 95 97  Resp: 16     Temp:      TempSrc:      SpO2:    100%  Weight:      Height:        Dilation: 7 Effacement (%): 90 Cervical Position: Anterior Station: 0, -1 Presentation: Vertex Exam by:: Hansel Feinstein CNM FHT: baseline rate 120, moderate varibility, +acel,  + decels (recurrent variables with decelerations into the 70s. Initiated amnioinfusion and LR bolus with improvement).  Toco: q2-3 min, MVU 80   Labs: Lab Results  Component Value Date   WBC 18.6 (H) 11/01/2019   HGB 10.7 (L) 11/01/2019   HCT 33.0 (L) 11/01/2019   MCV 88.7 11/01/2019   PLT 306 11/01/2019    Patient Active Problem List   Diagnosis Date Noted  . GBS (group B Streptococcus carrier), +RV culture, currently pregnant 10/26/2019  . Chronic hypertension affecting pregnancy 09/29/2019  . Chlamydia infection affecting pregnancy, antepartum 09/08/2019  . Hypertension 08/03/2019  . Supervision of high risk pregnancy, antepartum 05/04/2019  . Depression 08/04/2018    Assessment / Plan: Alicia Adkins is a 23 y.o. G1P0000 at [redacted]w[redacted]d presented for IOL for [facility] w/si PEC  #Labor: Active labor progressing. She is s/p FB and AROM. Initiated amnioinfusion and stopped pitocin given recurrent decelerations. Will re-start pitocin after period of rest.  #Fetal Wellbeing:  Cat II  #Pain Control: Epidural  #ID: GBS + treated with prophylaxis  #Anticipated MOD: SVD  #cHTN w/ superimposed severe Pre-E : BP have been appropriate since this  morning -Mag infusion running  -Labetalol protocol IV   Maxie Better, PGY-1, MD [facility]  11/02/2019, 2:36 AM

## 2019-11-02 NOTE — Anesthesia Postprocedure Evaluation (Signed)
Anesthesia Post Note  Patient: Alicia Adkins  Procedure(s) Performed: AN AD HOC LABOR EPIDURAL     Patient location during evaluation: Mother Baby Anesthesia Type: Epidural Level of consciousness: awake Pain management: satisfactory to patient Vital Signs Assessment: post-procedure vital signs reviewed and stable Respiratory status: spontaneous breathing Cardiovascular status: stable Anesthetic complications: no    Last Vitals:  Vitals:   11/02/19 1430 11/02/19 1501  BP:  139/77  Pulse:  (!) 102  Resp:    Temp:    SpO2: 97%     Last Pain:  Vitals:   11/02/19 1428  TempSrc:   PainSc: 0-No pain   Pain Goal:                   [employer]

## 2019-11-02 NOTE — Progress Notes (Signed)
NICU team at bedside

## 2019-11-02 NOTE — Progress Notes (Signed)
Called DR Ilda Basset   No need for Txa  Dose at 1400

## 2019-11-03 LAB — CBC
HCT: 26.9 % — ABNORMAL LOW (ref 36.0–46.0)
Hemoglobin: 8.6 g/dL — ABNORMAL LOW (ref 12.0–15.0)
MCH: 28.4 pg (ref 26.0–34.0)
MCHC: 32 g/dL (ref 30.0–36.0)
MCV: 88.8 fL (ref 80.0–100.0)
Platelets: 267 10*3/uL (ref 150–400)
RBC: 3.03 MIL/uL — ABNORMAL LOW (ref 3.87–5.11)
RDW: 14.7 % (ref 11.5–15.5)
WBC: 18.5 10*3/uL — ABNORMAL HIGH (ref 4.0–10.5)
nRBC: 0 % (ref 0.0–0.2)

## 2019-11-03 LAB — COMPREHENSIVE METABOLIC PANEL
ALT: 16 U/L (ref 0–44)
AST: 21 U/L (ref 15–41)
Albumin: 2.1 g/dL — ABNORMAL LOW (ref 3.5–5.0)
Alkaline Phosphatase: 79 U/L (ref 38–126)
Anion gap: 9 (ref 5–15)
BUN: 8 mg/dL (ref 6–20)
CO2: 21 mmol/L — ABNORMAL LOW (ref 22–32)
Calcium: 7.6 mg/dL — ABNORMAL LOW (ref 8.9–10.3)
Chloride: 109 mmol/L (ref 98–111)
Creatinine, Ser: 0.7 mg/dL (ref 0.44–1.00)
GFR calc Af Amer: >60 mL/min (ref >60)
GFR calc non Af Amer: >60 mL/min (ref >60)
Glucose, Bld: 104 mg/dL — ABNORMAL HIGH (ref 70–99)
Potassium: 3.7 mmol/L (ref 3.5–5.1)
Sodium: 139 mmol/L (ref 135–145)
Total Bilirubin: <0.1 mg/dL — ABNORMAL LOW (ref 0.3–1.2)
Total Protein: 5.5 g/dL — ABNORMAL LOW (ref 6.5–8.1)

## 2019-11-03 MED ORDER — AMLODIPINE BESYLATE 5 MG PO TABS
5.0000 mg | ORAL_TABLET | Freq: Every day | ORAL | Status: DC
  Administered 2019-11-03 – 2019-11-04 (×2): 5 mg via ORAL
  Filled 2019-11-03 (×2): qty 1

## 2019-11-03 MED ORDER — MEDROXYPROGESTERONE ACETATE 150 MG/ML IM SUSP
150.0000 mg | Freq: Once | INTRAMUSCULAR | Status: AC
  Administered 2019-11-03: 150 mg via INTRAMUSCULAR
  Filled 2019-11-03: qty 1

## 2019-11-03 NOTE — Progress Notes (Signed)
CSW received consult due to score 11 on Edinburgh Depression Screen and hx of depression. When CSW arrived to room 1078 MOB was resting in bed and infant was asleep in bassinet. MOB was polite, easy to engage and receptive to meeting with CSW.  CSW asked about MOB's MH hx and MOB denied having a dx of depression. Per MOB, MOB has never been seen by a psychiatrist or received outpatient counseling. CSW offered resources outpatient counseling and MOB declined. CSW reviewed MOB's Edinburgh score and MOB attributed her result to being a first time new mom.  CSW assessed for safety and MOB denied SI HI, and DV.  Per MOB, FOB is not involved and will not be providing support.  MOB also shared that MOB has a great support team that consists of her friends and family members.   CSW provided education regarding Baby Blues vs PMADs and provided MOB with resources for mental health follow up.  CSW encouraged MOB to evaluate her mental health throughout the postpartum period with the use of the New Mom Checklist developed by [facility] [employer] as well as the Edinburgh Postnatal Depression Scale and notify a medical professional if symptoms arise.  MOB presented with insight and awareness and did not display any acute MH symptoms.   Remi Rester Boyd-Gilyard, MSW, LCSW Clinical Social Work [phone]  

## 2019-11-03 NOTE — Lactation Note (Deleted)
This note was copied from a baby's chart. Lactation Consultation Note Baby 23 hrs old.  Baby has nasal congestion as well as sounds like mucous in chest and throat. Patted back attempting to get baby to clear mucous.  Mom hasn't attempted to BF since delivery. Baby snorting, pushing back, not interested in BF. Removed from breast, patted a few minutes, suctioned mouth, no mucous in mouth. Placed to breast in sitting up position football position. Baby finally latched well. Noted slight softening to breast. Mom has nipples pierced, ring in Rt. Nipple. Instructed mom to remove ring before latching.  Hand expression taught w/colostrum noted. Mom surprised to see colostrum. Mom is breast/formula feeding. Mom will be returning to work in 79 weeks. She doesn't think she will be pumping. Baby suckling on pacifier when [place] came into rm. LC discouraged pacifier for 2 weeks. Mom supplementing w/Gerber. Newborn behavior, STS, I&O, breast massage, positioning, props, support, feeding habits, supply and demand discussed. Mom encouraged to feed baby 8-12 times/24 hours and with feeding cues.  Mom shown how to use DEBP & how to disassemble, clean, & reassemble parts. Mom knows to pump q3h for 15-20 min. Mom encouraged to waken baby for feeds if hasn't cued in 3 hrs. Encouraged to call for assistance or questions. Lactation brochure given. LC not sure if mom is very interested in BF. Mom stated she would try BF.   Patient Name: Alicia Adkins ALPFX'T Date: 11/03/2019 Reason for consult: Initial assessment;Primapara;Term   Maternal Data Has patient been taught Hand Expression?: Yes Does the patient have breastfeeding experience prior to this delivery?: No  Feeding    LATCH Score Latch: Repeated attempts needed to sustain latch, nipple held in mouth throughout feeding, stimulation needed to elicit sucking reflex.  Audible Swallowing: None  Type of Nipple: Everted at rest and after  stimulation  Comfort (Breast/Nipple): Soft / non-tender  Hold (Positioning): Full assist, staff holds infant at breast  LATCH Score: 5  Interventions Interventions: Breast feeding basics reviewed;Adjust position;DEBP;Assisted with latch;Support pillows;Skin to skin;Position options;Breast massage;Hand express;Breast compression;Hand pump  Lactation Tools Discussed/Used Tools: Pump WIC Program: Yes Pump Review: Setup, frequency, and cleaning;Milk Storage Initiated by:: Allayne Stack RN IBCLC Date initiated:: 11/03/19   Consult Status Consult Status: Follow-up Date: 11/04/19 Follow-up type: In-patient    Pepper Kerrick, Elta [place] 11/03/2019, 1:22 AM

## 2019-11-03 NOTE — Lactation Note (Addendum)
This note was copied from a baby's chart. Lactation Consultation Note Baby 35 hrs old at time of consult. Mom stated she can latch to the Rt. Breast but not the Lt. Lt. [place] had edema, tissue thick. Baby wouldn't be able to latch. A Lot of reverse pressure and finger stimulation as well as hand expression softened tissue to where baby can latch. Baby has a little mouth, LC had to sand which into mouth. Baby BF well after being able to get nipple soft enough to compress. Mom has Large tubular breast. Large Nipples flat. Evert to short shaft w/stimulation. Hand expression easily collected 10 ml colostrum. Mom stated she had been leaking since 5 months pregnant. Shells given to wear in am. Hand pump given for pre-pumping prior to latching. Encouraged mom to wear bra for support. Mom has DEBP at bedside, has pumped collecting colostrum. Noted breast softening after feeding. Newborn behavior, STS, I&O, hand expression, milk storage, breast massage, positioning,  support, supply and demand discussed. Answered questions mom had. Lactation brochure given. Baby took 8 ml colostrum.   Patient Name: Alicia Adkins ELFYB'O Date: 11/03/2019 Reason for consult: Initial assessment;Primapara;Term   Maternal Data Has patient been taught Hand Expression?: Yes Does the patient have breastfeeding experience prior to this delivery?: No  Feeding Feeding Type: Breast Fed  LATCH Score Latch: Repeated attempts needed to sustain latch, nipple held in mouth throughout feeding, stimulation needed to elicit sucking reflex.  Audible Swallowing: A few with stimulation  Type of Nipple: Flat  Comfort (Breast/Nipple): Filling, red/small blisters or bruises, mild/mod discomfort(edema)  Hold (Positioning): Assistance needed to correctly position infant at breast and maintain latch.  LATCH Score: 5  Interventions Interventions: Breast feeding basics reviewed;Adjust position;DEBP;Assisted with  latch;Support pillows;Skin to skin;Position options;Breast massage;Hand express;Breast compression;Hand pump;Coconut oil;Pre-pump if needed;Shells;Reverse pressure  Lactation Tools Discussed/Used Tools: Pump;Shells;Coconut oil Shell Type: Inverted Breast pump type: Double-Electric Breast Pump;Manual Pump Review: Setup, frequency, and cleaning;Milk Storage Initiated by:: Allayne Stack RN IBCLC Date initiated:: 11/03/19   Consult Status Consult Status: Follow-up Date: 11/04/19 Follow-up type: In-patient    Tyreece Gelles, Elta [place] 11/03/2019, 1:30 AM

## 2019-11-03 NOTE — Lactation Note (Signed)
This note was copied from a baby's chart. Lactation Consultation Note: P1, infant is 24 hours. Mother has attempt to breastfeed, but mostly bottle feeding . Mother reports that she would like to breastfeed. Infant has had 5 or more bottles of formula.   Assist mother to chair with pillow support. Infant latched on the left breast in football hold. Infant was given 12 ml of formula at the breast with a curved tip syringe. Infant sustained latch for 10-15 mins. Observed swallows and steady pattern of suckling. Assist mother with cross cradle hold. Infant latched and sustained latch for 15 mins. Infant was given 84ml of formula with a curved tip syringe at the breast.   Mother assist with pumping using a #24 flange. Advised mother to offer breast first and then supplement with formula until milk comes to volume.   [place] assist with pace bottle feeding infant while mother was pumping. Infant took another 5 ml from the bottle.   Advised mother to continue to pump after each feeding for 15 mins.   Mother receptive to all teaching. She has her sister as support staying in room with her.  Mother advised to breastfeed infant with all feeding cues and at least 8-12 times in 24 hours.  Mother to page for staff with positioning infant to latch as needed.   Patient Name: Alicia Adkins JZPHX'T Date: 11/03/2019     Maternal Data    Feeding    LATCH Score                   Interventions    Lactation Tools Discussed/Used     Consult Status      Alicia Adkins [facility] 11/03/2019, 3:20 PM

## 2019-11-03 NOTE — Lactation Note (Deleted)
This note was copied from a baby's chart. Lactation Consultation Note Baby 81 hrs old.  Baby has nasal congestion as well as sounds like mucous in chest and throat. Patted back attempting to get baby to clear mucous.  Mom hasn't attempted to BF since delivery. Baby snorting, pushing back, not interested in BF. Removed from breast, patted a few minutes, suctioned mouth, no mucous in mouth. Placed to breast in sitting up position football position. Baby finally latched well. Noted slight softening to breast. Mom has nipples pierced, ring in Rt. Nipple. Instructed mom to remove ring before latching.  Hand expression taught w/colostrum noted. Mom surprised to see colostrum. Mom is breast/formula feeding. Mom will be returning to work in 48 weeks. She doesn't think she will be pumping. Baby suckling on pacifier when [place] came into rm. LC discouraged pacifier for 2 weeks. Mom supplementing w/Gerber. Newborn behavior, STS, I&O, breast massage, positioning, props, support, feeding habits, supply and demand discussed. Mom encouraged to feed baby 8-12 times/24 hours and with feeding cues.  Mom shown how to use DEBP & how to disassemble, clean, & reassemble parts. Mom knows to pump q3h for 15-20 min. Mom encouraged to waken baby for feeds if hasn't cued in 3 hrs. Encouraged to call for assistance or questions. Lactation brochure given. LC not sure if mom is very interested in BF. Mom stated she would try BF.   Patient Name: Alicia Adkins HWEXH'B Date: 11/03/2019 Reason for consult: Initial assessment;Primapara;Term   Maternal Data Has patient been taught Hand Expression?: Yes Does the patient have breastfeeding experience prior to this delivery?: No  Feeding    LATCH Score Latch: Repeated attempts needed to sustain latch, nipple held in mouth throughout feeding, stimulation needed to elicit sucking reflex.  Audible Swallowing: None  Type of Nipple: Everted at rest and after  stimulation  Comfort (Breast/Nipple): Soft / non-tender  Hold (Positioning): Full assist, staff holds infant at breast  LATCH Score: 5  Interventions Interventions: Breast feeding basics reviewed;Adjust position;DEBP;Assisted with latch;Support pillows;Skin to skin;Position options;Breast massage;Hand express;Breast compression;Hand pump  Lactation Tools Discussed/Used Tools: Pump WIC Program: Yes Pump Review: Setup, frequency, and cleaning;Milk Storage Initiated by:: Allayne Stack RN IBCLC Date initiated:: 11/03/19   Consult Status Consult Status: Follow-up Date: 11/04/19 Follow-up type: In-patient    Theodoro Kalata 11/03/2019, 12:17 AM

## 2019-11-03 NOTE — Progress Notes (Signed)
Post Partum Day 1  Subjective: no complaints, up ad lib, voiding and tolerating PO, she is bottlefeeding.,  Objective: Blood pressure 123/60, pulse 91, temperature 98.2 F (36.8 C), temperature source Oral, resp. rate 19, height 5\' 4"  (1.626 m), weight 122.8 kg, last menstrual period 02/01/2019, SpO2 98 %, unknown if currently breastfeeding.  Physical Exam:  General: alert Lochia: appropriate Uterine Fundus: firm and NT at umbilicus DVT Evaluation: No evidence of DVT seen on physical exam.  Recent Labs    11/02/19 1142 11/03/19 0606  HGB 10.4* 8.6*  HCT 31.9* 26.9*    Assessment/Plan: Plan for discharge tomorrow  Magnesium will stop around 11am today She opts for depo provera now and will start on OCPs when her BPs are better    LOS: 2 days   Emily Filbert 11/03/2019, 7:52 AM

## 2019-11-03 NOTE — Plan of Care (Signed)
Continue to encourage mother to care for infant

## 2019-11-04 MED ORDER — AMLODIPINE BESYLATE 5 MG PO TABS: 5.0000 mg | ORAL_TABLET | Freq: Every day | ORAL | 0 refills | Status: DC

## 2019-11-04 MED ORDER — SIMETHICONE 80 MG PO CHEW: 80.0000 mg | CHEWABLE_TABLET | ORAL | 0 refills | Status: DC | PRN

## 2019-11-04 MED ORDER — IBUPROFEN 600 MG PO TABS: 600.0000 mg | ORAL_TABLET | Freq: Four times a day (QID) | ORAL | 0 refills | Status: DC | PRN

## 2019-11-04 MED ORDER — BENZOCAINE-MENTHOL 20-0.5 % EX AERO: 1.0000 "application " | INHALATION_SPRAY | CUTANEOUS | Status: DC | PRN

## 2019-11-04 MED FILL — MI-ACID GAS 80 MG TAB CHEW: 80 | 7 days supply | Qty: 30 | Fill #0

## 2019-11-04 MED FILL — IBUPROFEN 600 MG TABLET: 600 | 8 days supply | Qty: 30 | Fill #0

## 2019-11-04 MED FILL — AMLODIPINE BESYLATE 5 MG TA: 5 | 30 days supply | Qty: 30 | Fill #0

## 2019-11-04 NOTE — Lactation Note (Signed)
This note was copied from a baby's chart. Lactation Consultation Note: Mother reports that she has decided to bottle feeding formula. Mother advised to limit breast stimulation . Mother is aware of available [place] services if she changes her mind.  Patient Name: Alicia Adkins KDTOI'Z Date: 11/04/2019 Reason for consult: Follow-up assessment   Maternal Data    Feeding Feeding Type: Bottle Fed - Formula Nipple Type: Slow - flow  LATCH Score                   Interventions    Lactation Tools Discussed/Used     Consult Status Consult Status: Complete    Darla Lesches 11/04/2019, 9:41 AM

## 2019-11-04 NOTE — Discharge Instructions (Signed)
Vaginal Delivery, Care After Refer to this sheet in the next few weeks. These discharge instructions provide you with information on caring for yourself after delivery. Your caregiver may also give you specific instructions. Your treatment has been planned according to the most current medical practices available, but problems sometimes occur. Call your caregiver if you have any problems or questions after you go home. HOME CARE INSTRUCTIONS 1. Take over-the-counter or prescription medicines only as directed by your caregiver or pharmacist. 2. Do not drink alcohol, especially if you are breastfeeding or taking medicine to relieve pain. 3. Do not smoke tobacco. 4. Continue to use good perineal care. Good perineal care includes: 1. Wiping your perineum from back to front 2. Keeping your perineum clean. 3. You can do sitz baths twice a day, to help keep this area clean 5. Do not use tampons, douche or have sex until your caregiver says it is okay. 6. Shower only and avoid sitting in submerged water, aside from sitz baths 7. Wear a well-fitting bra that provides breast support. 8. Eat healthy foods. 9. Drink enough fluids to keep your urine clear or pale yellow. 10. Eat high-fiber foods such as whole grain cereals and breads, brown rice, beans, and fresh fruits and vegetables every day. These foods may help prevent or relieve constipation. 11. Avoid constipation with high fiber foods or medications, such as miralax or metamucil 12. Follow your caregiver's recommendations regarding resumption of activities such as climbing stairs, driving, lifting, exercising, or traveling. 13. Talk to your caregiver about resuming sexual activities. Resumption of sexual activities is dependent upon your risk of infection, your rate of healing, and your comfort and desire to resume sexual activity. 14. Try to have someone help you with your household activities and your newborn for at least a few days after you leave  the hospital. 15. Rest as much as possible. Try to rest or take a nap when your newborn is sleeping. 16. Increase your activities gradually. 17. Keep all of your scheduled postpartum appointments. It is very important to keep your scheduled follow-up appointments. At these appointments, your caregiver will be checking to make sure that you are healing physically and emotionally. SEEK MEDICAL CARE IF:   You are passing large clots from your vagina. Save any clots to show your caregiver.  You have a foul smelling discharge from your vagina.  You have trouble urinating.  You are urinating frequently.  You have pain when you urinate.  You have a change in your bowel movements.  You have increasing redness, pain, or swelling near your vaginal incision (episiotomy) or vaginal tear.  You have pus draining from your episiotomy or vaginal tear.  Your episiotomy or vaginal tear is separating.  You have painful, hard, or reddened breasts.  You have a severe headache.  You have blurred vision or see spots.  You feel sad or depressed.  You have thoughts of hurting yourself or your newborn.  You have questions about your care, the care of your newborn, or medicines.  You are dizzy or light-headed.  You have a rash.  You have nausea or vomiting.  You were breastfeeding and have not had a menstrual period within 12 weeks after you stopped breastfeeding.  You are not breastfeeding and have not had a menstrual period by the 12th week after delivery.  You have a fever. SEEK IMMEDIATE MEDICAL CARE IF:   You have persistent pain.  You have chest pain.  You have shortness of breath.    You faint.  You have leg pain.  You have stomach pain.  Your vaginal bleeding saturates two or more sanitary pads in 1 hour. MAKE SURE YOU:   Understand these instructions.  Will watch your condition.  Will get help right away if you are not doing well or get worse. Document Released:  12/05/2000 Document Revised: 04/24/2014 Document Reviewed: 08/04/2012 [facility] Patient Information 2015 [place], [place]. This information is not intended to replace advice given to you by your health care provider. Make sure you discuss any questions you have with your health care provider.  Sitz Bath A sitz bath is a warm water bath taken in the sitting position. The water covers only the hips and butt (buttocks). We recommend using one that fits in the toilet, to help with ease of use and cleanliness. It may be used for either healing or cleaning purposes. Sitz baths are also used to relieve pain, itching, or muscle tightening (spasms). The water may contain medicine. Moist heat will help you heal and relax.  HOME CARE  Take 3 to 4 sitz baths a day. 18. Fill the bathtub half-full with warm water. 19. Sit in the water and open the drain a little. 20. Turn on the warm water to keep the tub half-full. Keep the water running constantly. 21. Soak in the water for 15 to 20 minutes. 22. After the sitz bath, pat the affected area dry. GET HELP RIGHT AWAY IF: You get worse instead of better. Stop the sitz baths if you get worse. MAKE SURE YOU:  Understand these instructions.  Will watch your condition.  Will get help right away if you are not doing well or get worse. Document Released: 01/15/2005 Document Revised: 09/01/2012 Document Reviewed: 04/07/2011 [facility] Patient Information 2015 [place], [place]. This information is not intended to replace advice given to you by your health care provider. Make sure you discuss any questions you have with your health care provider.   Hypertension During Pregnancy Hypertension is also called high blood pressure. High blood pressure means that the force of your blood moving in your body is too strong. It can cause problems for you and your baby. Different types of high blood pressure can happen during pregnancy. The types are:  High blood pressure before  you got pregnant. This is called chronic hypertension.  This can continue during your pregnancy. Your doctor will want to keep checking your blood pressure. You may need medicine to keep your blood pressure under control while you are pregnant. You will need follow-up visits after you have your baby.  High blood pressure that goes up during pregnancy when it was normal before. This is called gestational hypertension. It will usually get better after you have your baby, but your doctor will need to watch your blood pressure to make sure that it is getting better.  Very high blood pressure during pregnancy. This is called preeclampsia. Very high blood pressure is an emergency that needs to be checked and treated right away.  You may develop very high blood pressure after giving birth. This is called postpartum preeclampsia. This usually occurs within 48 hours after childbirth but may occur up to 6 weeks after giving birth. This is rare. How does this affect me? If you have high blood pressure during pregnancy, you have a higher chance of developing high blood pressure:  As you get older.  If you get pregnant again. In some cases, high blood pressure during pregnancy can cause:  Stroke.  Heart attack.  Damage to the kidneys, lungs, or liver.  Preeclampsia.  Jerky movements you cannot control (convulsions or seizures).  Problems with the placenta.   What can I do to lower my risk?   Keep a healthy weight.  Eat a healthy diet.  Follow what your doctor tells you about treating any medical problems that you had before becoming pregnant. It is very important to go to all of your doctor visits. Your doctor will check your blood pressure and make sure that your pregnancy is progressing as it should. Treatment should start early if a problem is found.   Follow these instructions at home:  Take your blood pressure 1-2 times per day. Call the office if your blood pressure is 155 or higher  for the top number or 105 or higher for the bottom number.    Eating and drinking   Drink enough fluid to keep your pee (urine) pale yellow.  Avoid caffeine. Lifestyle  Do not use any products that contain nicotine or tobacco, such as cigarettes, e-cigarettes, and chewing tobacco. If you need help quitting, ask your doctor.  Do not use alcohol or drugs.  Avoid stress.  Rest and get plenty of sleep.  Regular exercise can help. Ask your doctor what kinds of exercise are best for you. General instructions  Take over-the-counter and prescription medicines only as told by your doctor.  Keep all prenatal and follow-up visits as told by your doctor. This is important. Contact a doctor if:  You have symptoms that your doctor told you to watch for, such as: ? Headaches. ? Nausea. ? Vomiting. ? Belly (abdominal) pain. ? Dizziness. ? Light-headedness. Get help right away if:  You have: ? Very bad belly pain that does not get better with treatment. ? A very bad headache that does not get better. ? Vomiting that does not get better. ? Sudden, fast weight gain. ? Sudden swelling in your hands, ankles, or face. ? Blood in your pee. ? Blurry vision. ? Double vision. ? Shortness of breath. ? Chest pain. ? Weakness on one side of your body. ? Trouble talking. Summary  High blood pressure is also called hypertension.  High blood pressure means that the force of your blood moving in your body is too strong.  High blood pressure can cause problems for you and your baby.  Keep all follow-up visits as told by your doctor. This is important. This information is not intended to replace advice given to you by your health care provider. Make sure you discuss any questions you have with your health care provider. Document Released: 01/10/2011 Document Revised: 03/31/2019 Document Reviewed: 01/04/2019 Elsevier Patient Education  2020 [employer].

## 2019-11-07 ENCOUNTER — Other Ambulatory Visit: Payer: BC Managed Care – PPO

## 2019-11-07 ENCOUNTER — Encounter: Payer: BC Managed Care – PPO | Admitting: Obstetrics & Gynecology

## 2019-11-14 ENCOUNTER — Encounter: Payer: Self-pay | Admitting: *Deleted

## 2019-11-15 ENCOUNTER — Encounter: Payer: Self-pay | Admitting: *Deleted

## 2019-11-15 ENCOUNTER — Other Ambulatory Visit: Payer: Self-pay

## 2019-11-15 ENCOUNTER — Telehealth (INDEPENDENT_AMBULATORY_CARE_PROVIDER_SITE_OTHER): Payer: BC Managed Care – PPO | Admitting: *Deleted

## 2019-11-15 VITALS — BP 136/91 | HR 106

## 2019-11-15 DIAGNOSIS — Z0130 Encounter for examination of blood pressure without abnormal findings: Secondary | ICD-10-CM

## 2019-11-15 NOTE — Progress Notes (Signed)
I connected with  Alicia Adkins on 11/15/19 by a video enabled telemedicine application and verified that I am speaking with the correct person using two identifiers.   I discussed the limitations of evaluation and management by telemedicine. The patient expressed understanding and agreed to proceed.    NURSE VISIT- BLOOD PRESSURE CHECK  SUBJECTIVE:  Alicia Adkins is a 23 y.o. [id number] female here for BP check. She is postpartum, delivery date 11/02/2019    HYPERTENSION ROS:  Postpartum:  . Severe headaches that don't go away with tylenol/other medicines: No  . Visual changes (seeing spots/double/blurred vision) No  . Severe pain under right breast breast or in center of upper chest No  . Severe nausea/vomiting No  . Taking medicines as instructed yes   OBJECTIVE:  BP (!) 136/91 (BP Location: Left Arm, Patient Position: Sitting, Cuff Size: Large)   Pulse (!) 106   Breastfeeding No   Appearance alert, well appearing, and in no distress and oriented to person, place, and time.  ASSESSMENT: Postpartum  blood pressure check  PLAN: Discussed with Alicia Adkins, CNM, [facility]   Recommendations:Increase Norvasc to 10mg  daily. stop medicine 2 days before next visit   Follow-up: as scheduled   Alicia Adkins  11/15/2019 4:00 PM

## 2019-11-27 ENCOUNTER — Other Ambulatory Visit: Payer: Self-pay

## 2019-11-28 ENCOUNTER — Telehealth: Payer: Self-pay | Admitting: *Deleted

## 2019-11-28 ENCOUNTER — Encounter: Payer: Self-pay | Admitting: *Deleted

## 2019-11-28 MED ORDER — AMLODIPINE BESYLATE 5 MG PO TABS: 5.0000 mg | ORAL_TABLET | Freq: Every day | ORAL | 11 refills | Status: DC

## 2019-11-28 NOTE — Telephone Encounter (Signed)
Pt requesting a note to return to work. She has her PP visit on 12/17. She had vaginal delivery on 11/02/19.

## 2019-11-28 NOTE — Telephone Encounter (Signed)
yes

## 2019-12-08 ENCOUNTER — Ambulatory Visit: Payer: BC Managed Care – PPO | Admitting: Advanced Practice Midwife

## 2020-02-13 NOTE — Progress Notes (Signed)
   HIGH-RISK PREGNANCY VISIT Patient name: Alicia Adkins MRN [id number]  Date of birth: 11-25-1996 Chief Complaint:   Routine Prenatal Visit (BPP today)  History of Present Illness:   Alicia Adkins is a 24 y.o. G67P1001 female at [redacted]w[redacted]d with an Estimated Date of Delivery: 11/08/19 being seen today for ongoing management of a high-risk pregnancy complicated by chronic hypertension currently on no meds.  Today she reports backache. Contractions: Not present. Vag. Bleeding: None.  Movement: Present. denies leaking of fluid.  Review of Systems:   Pertinent items are noted in HPI Denies abnormal vaginal discharge w/ itching/odor/irritation, headaches, visual changes, shortness of breath, chest pain, abdominal pain, severe nausea/vomiting, or problems with urination or bowel movements unless otherwise stated above. Pertinent History Reviewed:  Reviewed past medical,surgical, social, obstetrical and family history.  Reviewed problem list, medications and allergies. Physical Assessment:   Vitals:   10/17/19 1235  BP: 136/80  Pulse: 94  There is no height or weight on file to calculate BMI.           Physical Examination:   General appearance: alert, well appearing, and in no distress  Mental status: alert, oriented to person, place, and time  Skin: warm & dry   Extremities: Edema: Trace    Cardiovascular: normal heart rate noted  Respiratory: normal respiratory effort, no distress  Abdomen: gravid, soft, non-tender  Pelvic: Cervical exam deferred         Fetal Status:     Movement: Present    Fetal Surveillance Testing today: BPP 8/8 with normal Doppler ratios   Chaperone: n/a    No results found for this or any previous visit (from the past 24 hour(s)).  Assessment & Plan:  1) High-risk pregnancy G1P1001 at [redacted]w[redacted]d with an Estimated Date of Delivery: 11/08/19   2) CHTN, stable, no meds, reassuring surveillance    Meds: No orders of the defined types were placed in this  encounter.   Labs/procedures today: sonogram  Treatment Plan:  IOL 39-40 weeks or as clinically indicated with intermittentt fetal surveillance  Reviewed: Term labor symptoms and general obstetric precautions including but not limited to vaginal bleeding, contractions, leaking of fluid and fetal movement were reviewed in detail with the patient.  All questions were answered.  home bp cuff. Rx faxed to . Check bp weekly, let us know if >140/90.   Follow-up: Return in about 1 week (around 10/24/2019) for BPP/sono, HROB.  No orders of the defined types were placed in this encounter.  Lazaro Arms

## 2020-03-03 ENCOUNTER — Ambulatory Visit: Payer: Medicaid Other | Attending: Internal Medicine

## 2020-03-03 DIAGNOSIS — Z23 Encounter for immunization: Secondary | ICD-10-CM

## 2020-03-03 NOTE — Progress Notes (Signed)
   Covid-19 Vaccination Clinic  Name:  Alicia Adkins    MRN: [id number] DOB: [birth year]  03/03/2020  [name] was observed post Covid-19 immunization for 15 minutes without incident. She was provided with Vaccine Information Sheet and instruction to access the V-Safe system.   [name] was instructed to call 911 with any severe reactions post vaccine: Marland Kitchen Difficulty breathing  . Swelling of face and throat  . A fast heartbeat  . A bad rash all over body  . Dizziness and weakness   Immunizations Administered    Name Date Dose VIS Date Route   Moderna COVID-19 Vaccine 03/03/2020 11:38 AM 0.5 mL 11/22/2019 Intramuscular   Manufacturer: Moderna   Lot: [id number]   NDC: 82993-716-96

## 2020-04-04 ENCOUNTER — Ambulatory Visit: Payer: Medicaid Other | Attending: Internal Medicine

## 2020-04-04 DIAGNOSIS — Z23 Encounter for immunization: Secondary | ICD-10-CM

## 2020-04-04 NOTE — Progress Notes (Signed)
   Covid-19 Vaccination Clinic  Name:  MIDA CORY    MRN: [id number] DOB: [birth year]  04/04/2020  Ms. Hon was observed post Covid-19 immunization for 15 minutes without incident. She was provided with Vaccine Information Sheet and instruction to access the V-Safe system.   Ms. Maddalena was instructed to call 911 with any severe reactions post vaccine: Marland Kitchen Difficulty breathing  . Swelling of face and throat  . A fast heartbeat  . A bad rash all over body  . Dizziness and weakness   Immunizations Administered    Name Date Dose VIS Date Route   Moderna COVID-19 Vaccine 04/04/2020 11:33 AM 0.5 mL 11/22/2019 Intramuscular   Manufacturer: Moderna   Lot: [id number]   NDC: 89784-784-12

## 2020-10-30 ENCOUNTER — Other Ambulatory Visit: Payer: Medicaid Other

## 2020-10-30 ENCOUNTER — Other Ambulatory Visit (INDEPENDENT_AMBULATORY_CARE_PROVIDER_SITE_OTHER): Payer: Medicaid Other | Admitting: *Deleted

## 2020-10-30 ENCOUNTER — Encounter: Payer: Self-pay | Admitting: *Deleted

## 2020-10-30 VITALS — BP 129/84 | HR 106 | Ht 63.5 in | Wt 227.5 lb

## 2020-10-30 DIAGNOSIS — Z3201 Encounter for pregnancy test, result positive: Secondary | ICD-10-CM

## 2020-10-30 LAB — POCT URINE PREGNANCY: Preg Test, Ur: POSITIVE — AB

## 2020-10-30 NOTE — Progress Notes (Addendum)
   NURSE VISIT- PREGNANCY CONFIRMATION   SUBJECTIVE:  Alicia Adkins is a 24 y.o. G77P1001 female at [redacted]w[redacted]d by certain LMP of Patient's last menstrual period was 09/21/2020. Here for pregnancy confirmation.  Home pregnancy test: positive x 4  She reports no complaints.  She is not taking prenatal vitamins.    OBJECTIVE:  BP 129/84 (BP Location: Right Arm, Patient Position: Sitting, Cuff Size: Large)   Pulse (!) 106   Ht 5' 3.5" (1.613 m)   Wt 227 lb 8 oz (103.2 kg)   LMP 09/21/2020   Breastfeeding No   BMI 39.67 kg/m   Appears well, in no apparent distress OB History  Gravida Para Term Preterm AB Living  2 1 1  0 0 1  SAB TAB Ectopic Multiple Live Births  0 0 0 0 1    # Outcome Date GA Lbr Len/2nd Weight Sex Delivery Anes PTL Lv  2 Current           1 Term 11/02/19 [redacted]w[redacted]d 18:48 / 00:55 7 lb 3.3 oz (3.27 kg) M Vag-Vacuum EPI  LIV    Results for orders placed or performed in visit on 10/30/20 (from the past 24 hour(s))  POCT urine pregnancy   Collection Time: 10/30/20 10:31 AM  Result Value Ref Range   Preg Test, Ur Positive (A) Negative    ASSESSMENT: Positive pregnancy test, [redacted]w[redacted]d by LMP    PLAN: Schedule for dating ultrasound in 3 weeks Prenatal vitamins: plans to begin OTC ASAP   Nausea medicines: not currently needed   OB packet given: Yes  [redacted]w[redacted]d  10/30/2020 10:37 AM   Chart reviewed for nurse visit. Agree with plan of care.  13/08/2020, Cheral Marker 10/30/2020 12:25 PM

## 2020-11-14 ENCOUNTER — Other Ambulatory Visit: Payer: Self-pay | Admitting: Obstetrics & Gynecology

## 2020-11-14 DIAGNOSIS — O3680X0 Pregnancy with inconclusive fetal viability, not applicable or unspecified: Secondary | ICD-10-CM

## 2020-11-19 ENCOUNTER — Other Ambulatory Visit: Payer: Self-pay

## 2020-11-19 ENCOUNTER — Ambulatory Visit (INDEPENDENT_AMBULATORY_CARE_PROVIDER_SITE_OTHER): Payer: Medicaid Other

## 2020-11-19 DIAGNOSIS — Z3A08 8 weeks gestation of pregnancy: Secondary | ICD-10-CM

## 2020-11-19 DIAGNOSIS — O3680X0 Pregnancy with inconclusive fetal viability, not applicable or unspecified: Secondary | ICD-10-CM

## 2020-11-19 NOTE — Progress Notes (Signed)
[place] 8+3 wks,single IUP with YS,positive fht 178 bpm,crl 18.21 mm,normal ovaries

## 2020-11-20 ENCOUNTER — Other Ambulatory Visit: Payer: Medicaid Other

## 2020-12-07 ENCOUNTER — Other Ambulatory Visit: Payer: Self-pay | Admitting: Obstetrics & Gynecology

## 2020-12-07 DIAGNOSIS — Z3682 Encounter for antenatal screening for nuchal translucency: Secondary | ICD-10-CM

## 2020-12-12 ENCOUNTER — Encounter: Payer: Self-pay | Admitting: Advanced Practice Midwife

## 2020-12-12 DIAGNOSIS — O0990 Supervision of high risk pregnancy, unspecified, unspecified trimester: Secondary | ICD-10-CM | POA: Insufficient documentation

## 2020-12-17 ENCOUNTER — Encounter: Payer: Self-pay | Admitting: Advanced Practice Midwife

## 2020-12-17 ENCOUNTER — Ambulatory Visit (INDEPENDENT_AMBULATORY_CARE_PROVIDER_SITE_OTHER): Payer: Medicaid Other

## 2020-12-17 ENCOUNTER — Ambulatory Visit (INDEPENDENT_AMBULATORY_CARE_PROVIDER_SITE_OTHER): Payer: Medicaid Other | Admitting: Advanced Practice Midwife

## 2020-12-17 ENCOUNTER — Other Ambulatory Visit: Payer: Self-pay

## 2020-12-17 ENCOUNTER — Encounter: Payer: Medicaid Other | Admitting: Advanced Practice Midwife

## 2020-12-17 VITALS — BP 129/82 | HR 87 | Wt 226.0 lb

## 2020-12-17 DIAGNOSIS — Z3682 Encounter for antenatal screening for nuchal translucency: Secondary | ICD-10-CM | POA: Diagnosis not present

## 2020-12-17 DIAGNOSIS — I10 Essential (primary) hypertension: Secondary | ICD-10-CM | POA: Insufficient documentation

## 2020-12-17 DIAGNOSIS — Z3A12 12 weeks gestation of pregnancy: Secondary | ICD-10-CM

## 2020-12-17 DIAGNOSIS — Z3481 Encounter for supervision of other normal pregnancy, first trimester: Secondary | ICD-10-CM | POA: Diagnosis not present

## 2020-12-17 DIAGNOSIS — O0991 Supervision of high risk pregnancy, unspecified, first trimester: Secondary | ICD-10-CM | POA: Diagnosis not present

## 2020-12-17 DIAGNOSIS — O0992 Supervision of high risk pregnancy, unspecified, second trimester: Secondary | ICD-10-CM

## 2020-12-17 LAB — POCT URINALYSIS DIPSTICK OB
Blood, UA: NEGATIVE
Glucose, UA: NEGATIVE
Ketones, UA: NEGATIVE
Leukocytes, UA: NEGATIVE
Nitrite, UA: NEGATIVE
POC,PROTEIN,UA: NEGATIVE

## 2020-12-17 MED ORDER — ASPIRIN EC 81 MG PO TBEC: 162.0000 mg | DELAYED_RELEASE_TABLET | Freq: Every day | ORAL | 6 refills | Status: DC

## 2020-12-17 NOTE — Progress Notes (Addendum)
[place] 12+3 wks,measurements c/w dates,crl 60.69 mm,posterior placenta,fhr 162 bpm,NB present.NT 1.4 mm

## 2020-12-17 NOTE — Progress Notes (Signed)
INITIAL OBSTETRICAL VISIT Patient name: Alicia Adkins MRN [id number]  Date of birth: 1996/05/07 Chief Complaint:   Initial Prenatal Visit (Nt/it)  History of Present Illness:   Alicia Adkins is a 24 y.o. G87P1001 African American female at [redacted]w[redacted]d by LMP c/w u/s at 8 weeks with an Estimated Date of Delivery: 06/28/21 being seen today for her initial obstetrical visit.   Her obstetrical history is significant for CHTN (no meds) w/hx SIPE. Also hx of depression, no meds.  Doing well, declines therapy referral.  .   Today she reports no complaints.  Depression screen [facility] 2/9 12/17/2020 05/04/2019 08/04/2018  Decreased Interest 0 2 0  Down, Depressed, Hopeless 0 2 2  PHQ - 2 Score 0 4 2  Altered sleeping 1 3 2   Tired, decreased energy 1 1 1   Change in appetite 0 1 3  Feeling bad or failure about yourself  0 0 1  Trouble concentrating 0 0 2  Moving slowly or fidgety/restless 0 0 0  Suicidal thoughts 0 0 0  PHQ-9 Score 2 9 11   Difficult doing work/chores - - Not difficult at all    Patient's last menstrual period was 09/21/2020. Last pap 08/04/18. Results were: normal Review of Systems:   Pertinent items are noted in HPI Denies cramping/contractions, leakage of fluid, vaginal bleeding, abnormal vaginal discharge w/ itching/odor/irritation, headaches, visual changes, shortness of breath, chest pain, abdominal pain, severe nausea/vomiting, or problems with urination or bowel movements unless otherwise stated above.  Pertinent History Reviewed:  Reviewed past medical,surgical, social, obstetrical and family history.  Reviewed problem list, medications and allergies. OB History  Gravida Para Term Preterm AB Living  2 1 1  0 0 1  SAB IAB Ectopic Multiple Live Births  0 0 0 0 1    # Outcome Date GA Lbr Len/2nd Weight Sex Delivery Anes PTL Lv  2 Current           1 Term 11/02/19 [redacted]w[redacted]d 18:48 / 00:55 7 lb 3.3 oz (3.27 kg) M Vag-Vacuum EPI N LIV     Complications: Chronic hypertension    Physical Assessment:  There were no vitals filed for this visit.There is no height or weight on file to calculate BMI.       Physical Examination:  General appearance - well appearing, and in no distress  Mental status - alert, oriented to person, place, and time  Psych:  She has a normal mood and affect  Skin - warm and dry, normal color, no suspicious lesions noted  Chest - effort normal  Heart - normal rate and regular rhythm  Abdomen - soft, nontender  Extremities:  No swelling or varicosities noted    TODAY'S NT 08/06/18 12+3 wks,measurements c/w dates,crl 60.69 mm,posterior placenta,fhr 162 bpm,NB present,unable to obtain NT because of fetal position,have pt come back today after appt please   No results found for this or any previous visit (from the past 24 hour(s)).  Assessment & Plan:  1) High-Risk Pregnancy G2P1001 at [redacted]w[redacted]d with an Estimated Date of Delivery: 06/28/21   2) Initial OB visit  3) CHTN, no meds:  Baseline labs:___  Baby ASA 162mg  @ 12wks [x]   Growth u/s @ 20, 24, 28, 33, 36wks     2x/wk testing nst/sono @ 36wks   Deliver @ 39wks:______   Meds: No orders of the defined types were placed in this encounter.   Initial labs obtained Continue prenatal vitamins Reviewed n/v relief measures and warning s/s to report Reviewed  recommended weight gain based on pre-gravid BMI Encouraged well-balanced diet Genetic & carrier screening discussed: requests Panorama, NT/IT and Horizon 14 , declines AFP Ultrasound discussed; fetal survey: requested CCNC completed> form faxed if has or is planning to apply for medicaid The nature of [facility] [employer] with multiple MDs and other Advanced Practice Providers was explained to patient; also emphasized that fellows, residents, and students are part of our team. Has home bp cuff.. Check bp weekly, let us know if >140/90.        Scarlette Calico Cresenzo-Dishmon 2:52 PM

## 2020-12-17 NOTE — Patient Instructions (Signed)
Laverda Sorenson, I greatly value your feedback.  If you receive a survey following your visit with [place] today, we appreciate you taking the time to fill it out.  Thanks, Cathie Beams, DNP, CNM  [facility] HAS MOVED!!! It is now [facility] at [facility] ([address] [place], [place] [address]) Entrance located off of [address] Free 24/7 valet parking   Nausea & Vomiting  Have saltine crackers or pretzels by your bed and eat a few bites before you raise your head out of bed in the morning  Eat small frequent meals throughout the day instead of large meals  Drink plenty of fluids throughout the day to stay hydrated, just don't drink a lot of fluids with your meals.  This can make your stomach fill up faster making you feel sick  Do not brush your teeth right after you eat  Products with real ginger are good for nausea, like ginger ale and ginger hard candy Make sure it says made with real ginger!  Sucking on sour candy like lemon heads is also good for nausea  If your prenatal vitamins make you nauseated, take them at night so you will sleep through the nausea  Sea Bands  If you feel like you need medicine for the nausea & vomiting please let us know  If you are unable to keep any fluids or food down please let us know   Constipation  Drink plenty of fluid, preferably water, throughout the day  Eat foods high in fiber such as fruits, vegetables, and grains  Exercise, such as walking, is a good way to keep your bowels regular  Drink warm fluids, especially warm prune juice, or decaf coffee  Eat a 1/2 cup of real oatmeal (not instant), 1/2 cup applesauce, and 1/2-1 cup warm prune juice every day  If needed, you may take Colace (docusate sodium) stool softener once or twice a day to help keep the stool soft.   If you still are having problems with constipation, you may take Miralax once daily as needed to help keep your bowels regular.    Home Blood Pressure Monitoring for Patients   Your provider has recommended that you check your blood pressure (BP) at least once a week at home. If you do not have a blood pressure cuff at home, one will be provided for you. Contact your provider if you have not received your monitor within 1 week.   Helpful Tips for Accurate Home Blood Pressure Checks  . Don't smoke, exercise, or drink caffeine 30 minutes before checking your BP . Use the restroom before checking your BP (a full bladder can raise your pressure) . Relax in a comfortable upright chair . Feet on the ground . Left arm resting comfortably on a flat surface at the level of your heart . Legs uncrossed . Back supported . Sit quietly and don't talk . Place the cuff on your bare arm . Adjust snuggly, so that only two fingertips can fit between your skin and the top of the cuff . Check 2 readings separated by at least one minute . Keep a log of your BP readings . For a visual, please reference this diagram: http://ccnc.care/bpdiagram  Provider Name: [facility]     Phone: [phone]  Zone 1: ALL CLEAR  Continue to monitor your symptoms:  . BP reading is less than 140 (top number) or less than 90 (bottom number)  . No right upper stomach  pain . No headaches or seeing spots . No feeling nauseated or throwing up . No swelling in face and hands  Zone 2: CAUTION Call your doctor's office for any of the following:  . BP reading is greater than 140 (top number) or greater than 90 (bottom number)  . Stomach pain under your ribs in the middle or right side . Headaches or seeing spots . Feeling nauseated or throwing up . Swelling in face and hands  Zone 3: EMERGENCY  Seek immediate medical care if you have any of the following:  . BP reading is greater than160 (top number) or greater than 110 (bottom number) . Severe headaches not improving with Tylenol . Serious difficulty catching your breath . Any worsening  symptoms from Zone 2    First Trimester of Pregnancy The first trimester of pregnancy is from week 1 until the end of week 12 (months 1 through 3). A week after a sperm fertilizes an egg, the egg will implant on the wall of the uterus. This embryo will begin to develop into a baby. Genes from you and your partner are forming the baby. The female genes determine whether the baby is a boy or a girl. At 6-8 weeks, the eyes and face are formed, and the heartbeat can be seen on ultrasound. At the end of 12 weeks, all the baby's organs are formed.  Now that you are pregnant, you will want to do everything you can to have a healthy baby. Two of the most important things are to get good prenatal care and to follow your health care provider's instructions. Prenatal care is all the medical care you receive before the baby's birth. This care will help prevent, find, and treat any problems during the pregnancy and childbirth. BODY CHANGES Your body goes through many changes during pregnancy. The changes vary from woman to woman.   You may gain or lose a couple of pounds at first.  You may feel sick to your stomach (nauseous) and throw up (vomit). If the vomiting is uncontrollable, call your health care provider.  You may tire easily.  You may develop headaches that can be relieved by medicines approved by your health care provider.  You may urinate more often. Painful urination may mean you have a bladder infection.  You may develop heartburn as a result of your pregnancy.  You may develop constipation because certain hormones are causing the muscles that push waste through your intestines to slow down.  You may develop hemorrhoids or swollen, bulging veins (varicose veins).  Your breasts may begin to grow larger and become tender. Your nipples may stick out more, and the tissue that surrounds them (areola) may become darker.  Your gums may bleed and may be sensitive to brushing and flossing.  Dark  spots or blotches (chloasma, mask of pregnancy) may develop on your face. This will likely fade after the baby is born.  Your menstrual periods will stop.  You may have a loss of appetite.  You may develop cravings for certain kinds of food.  You may have changes in your emotions from day to day, such as being excited to be pregnant or being concerned that something may go wrong with the pregnancy and baby.  You may have more vivid and strange dreams.  You may have changes in your hair. These can include thickening of your hair, rapid growth, and changes in texture. Some women also have hair loss during or after pregnancy, or hair that  feels dry or thin. Your hair will most likely return to normal after your baby is born. WHAT TO EXPECT AT YOUR PRENATAL VISITS During a routine prenatal visit:  You will be weighed to make sure you and the baby are growing normally.  Your blood pressure will be taken.  Your abdomen will be measured to track your baby's growth.  The fetal heartbeat will be listened to starting around week 10 or 12 of your pregnancy.  Test results from any previous visits will be discussed. Your health care provider may ask you:  How you are feeling.  If you are feeling the baby move.  If you have had any abnormal symptoms, such as leaking fluid, bleeding, severe headaches, or abdominal cramping.  If you have any questions. Other tests that may be performed during your first trimester include:  Blood tests to find your blood type and to check for the presence of any previous infections. They will also be used to check for low iron levels (anemia) and Rh antibodies. Later in the pregnancy, blood tests for diabetes will be done along with other tests if problems develop.  Urine tests to check for infections, diabetes, or protein in the urine.  An ultrasound to confirm the proper growth and development of the baby.  An amniocentesis to check for possible genetic  problems.  Fetal screens for spina bifida and Down syndrome.  You may need other tests to make sure you and the baby are doing well. HOME CARE INSTRUCTIONS  Medicines  Follow your health care provider's instructions regarding medicine use. Specific medicines may be either safe or unsafe to take during pregnancy.  Take your prenatal vitamins as directed.  If you develop constipation, try taking a stool softener if your health care provider approves. Diet  Eat regular, well-balanced meals. Choose a variety of foods, such as meat or vegetable-based protein, fish, milk and low-fat dairy products, vegetables, fruits, and whole grain breads and cereals. Your health care provider will help you determine the amount of weight gain that is right for you.  Avoid raw meat and uncooked cheese. These carry germs that can cause birth defects in the baby.  Eating four or five small meals rather than three large meals a day may help relieve nausea and vomiting. If you start to feel nauseous, eating a few soda crackers can be helpful. Drinking liquids between meals instead of during meals also seems to help nausea and vomiting.  If you develop constipation, eat more high-fiber foods, such as fresh vegetables or fruit and whole grains. Drink enough fluids to keep your urine clear or pale yellow. Activity and Exercise  Exercise only as directed by your health care provider. Exercising will help you:  Control your weight.  Stay in shape.  Be prepared for labor and delivery.  Experiencing pain or cramping in the lower abdomen or low back is a good sign that you should stop exercising. Check with your health care provider before continuing normal exercises.  Try to avoid standing for long periods of time. Move your legs often if you must stand in one place for a long time.  Avoid heavy lifting.  Wear low-heeled shoes, and practice good posture.  You may continue to have sex unless your health care  provider directs you otherwise. Relief of Pain or Discomfort  Wear a good support bra for breast tenderness.    Take warm sitz baths to soothe any pain or discomfort caused by hemorrhoids. Use hemorrhoid cream  if your health care provider approves.    Rest with your legs elevated if you have leg cramps or low back pain.  If you develop varicose veins in your legs, wear support hose. Elevate your feet for 15 minutes, 3-4 times a day. Limit salt in your diet. Prenatal Care  Schedule your prenatal visits by the twelfth week of pregnancy. They are usually scheduled monthly at first, then more often in the last 2 months before delivery.  Write down your questions. Take them to your prenatal visits.  Keep all your prenatal visits as directed by your health care provider. Safety  Wear your seat belt at all times when driving.  Make a list of emergency phone numbers, including numbers for family, friends, the hospital, and police and fire departments. General Tips  Ask your health care provider for a referral to a local prenatal education class. Begin classes no later than at the beginning of month 6 of your pregnancy.  Ask for help if you have counseling or nutritional needs during pregnancy. Your health care provider can offer advice or refer you to specialists for help with various needs.  Do not use hot tubs, steam rooms, or saunas.  Do not douche or use tampons or scented sanitary pads.  Do not cross your legs for long periods of time.  Avoid cat litter boxes and soil used by cats. These carry germs that can cause birth defects in the baby and possibly loss of the fetus by miscarriage or stillbirth.  Avoid all smoking, herbs, alcohol, and medicines not prescribed by your health care provider. Chemicals in these affect the formation and growth of the baby.  Schedule a dentist appointment. At home, brush your teeth with a soft toothbrush and be gentle when you floss. SEEK MEDICAL  CARE IF:   You have dizziness.  You have mild pelvic cramps, pelvic pressure, or nagging pain in the abdominal area.  You have persistent nausea, vomiting, or diarrhea.  You have a bad smelling vaginal discharge.  You have pain with urination.  You notice increased swelling in your face, hands, legs, or ankles. SEEK IMMEDIATE MEDICAL CARE IF:   You have a fever.  You are leaking fluid from your vagina.  You have spotting or bleeding from your vagina.  You have severe abdominal cramping or pain.  You have rapid weight gain or loss.  You vomit blood or material that looks like coffee grounds.  You are exposed to [place] measles and have never had them.  You are exposed to fifth disease or chickenpox.  You develop a severe headache.  You have shortness of breath.  You have any kind of trauma, such as from a fall or a car accident. Document Released: 12/02/2001 Document Revised: 04/24/2014 Document Reviewed: 10/18/2013 [facility] Patient Information 2015 [place], [place]. This information is not intended to replace advice given to you by your health care provider. Make sure you discuss any questions you have with your health care provider.  Coronavirus (COVID-19) Are you at risk?  Are you at risk for the Coronavirus (COVID-19)?  To be considered HIGH RISK for Coronavirus (COVID-19), you have to meet the following criteria:  . Traveled to [place], [place], [place], [place] or [place];  and have fever, cough, and shortness of breath within the last 2 weeks of travel OR . Been in close contact with a person diagnosed with COVID-19 within the last 2 weeks and have fever, cough, and shortness of breath . IF YOU DO  NOT MEET THESE CRITERIA, YOU ARE CONSIDERED LOW RISK FOR COVID-19.  What to do if you are HIGH RISK for COVID-19?  Marland Kitchen If you are having a medical emergency, call 911. . Seek medical care right away. Before you go to a doctor's office, urgent care or emergency department,  call ahead and tell them about your recent travel, contact with someone diagnosed with COVID-19, and your symptoms. You should receive instructions from your physician's office regarding next steps of care.  . When you arrive at healthcare provider, tell the healthcare staff immediately you have returned from visiting [place], [place], [place], [place] or [place]; in the last two weeks or you have been in close contact with a person diagnosed with COVID-19 in the last 2 weeks.   . Tell the health care staff about your symptoms: fever, cough and shortness of breath. . After you have been seen by a medical provider, you will be either: o Tested for (COVID-19) and discharged home on quarantine except to seek medical care if symptoms worsen, and asked to  - Stay home and avoid contact with others until you get your results (4-5 days)  - Avoid travel on public transportation if possible (such as bus, train, or airplane) or o Sent to the Emergency Department by EMS for evaluation, COVID-19 testing, and possible admission depending on your condition and test results.  What to do if you are LOW RISK for COVID-19?  Reduce your risk of any infection by using the same precautions used for avoiding the common cold or flu:  Marland Kitchen Wash your hands often with soap and warm water for at least 20 seconds.  If soap and water are not readily available, use an alcohol-based hand sanitizer with at least 60% alcohol.  . If coughing or sneezing, cover your mouth and nose by coughing or sneezing into the elbow areas of your shirt or coat, into a tissue or into your sleeve (not your hands). . Avoid shaking hands with others and consider head nods or verbal greetings only. . Avoid touching your eyes, nose, or mouth with unwashed hands.  . Avoid close contact with people who are sick. . Avoid places or events with large numbers of people in one location, like concerts or sporting events. . Carefully consider travel plans you have or  are making. . If you are planning any travel outside or inside the [place], visit the CDC's Travelers' Health webpage for the latest health notices. . If you have some symptoms but not all symptoms, continue to monitor at home and seek medical attention if your symptoms worsen. . If you are having a medical emergency, call 911.   [place] / e-Visit: [email or web address]         [facility]: [facility]: [phone]                   [facility]: [phone]     Safe Medications in Pregnancy   Acne: Benzoyl Peroxide Salicylic Acid  Backache/Headache: Tylenol: 2 regular strength every 4 hours OR              2 Extra strength every 6 hours  Colds/Coughs/Allergies: Benadryl (alcohol free) 25 mg every 6 hours as needed Breath right strips Claritin Cepacol throat lozenges Chloraseptic throat spray Cold-Eeze- up to three times per day Cough drops, alcohol free Flonase (by prescription only) Guaifenesin Mucinex Robitussin DM (plain only, alcohol  free) Saline nasal spray/drops Sudafed (pseudoephedrine) & Actifed ** use only after [redacted] weeks gestation and if you do not have high blood pressure Tylenol Vicks Vaporub Zinc lozenges Zyrtec   Constipation: Colace Ducolax suppositories Fleet enema Glycerin suppositories Metamucil Milk of magnesia Miralax Senokot Smooth move tea  Diarrhea: Kaopectate Imodium A-D  *NO pepto Bismol  Hemorrhoids: Anusol Anusol HC Preparation H Tucks  Indigestion: Tums Maalox Mylanta Zantac  Pepcid  Insomnia: Benadryl (alcohol free) 25mg  every 6 hours as needed Tylenol PM Unisom, no Gelcaps  Leg Cramps: Tums MagGel  Nausea/Vomiting:  Bonine Dramamine Emetrol Ginger extract Sea bands Meclizine  Nausea medication to take during pregnancy:  Unisom (doxylamine succinate  25 mg tablets) Take one tablet daily at bedtime. If symptoms are not adequately controlled, the dose can be increased to a maximum recommended dose of two tablets daily (1/2 tablet in the morning, 1/2 tablet mid-afternoon and one at bedtime). Vitamin B6 100mg  tablets. Take one tablet twice a day (up to 200 mg per day).  Skin Rashes: Aveeno products Benadryl cream or 25mg  every 6 hours as needed Calamine Lotion 1% cortisone cream  Yeast infection: Gyne-lotrimin 7 Monistat 7   **If taking multiple medications, please check labels to avoid duplicating the same active ingredients **take medication as directed on the label ** Do not exceed 4000 mg of tylenol in 24 hours **Do not take medications that contain aspirin or ibuprofen   Meet the Provider [facility] is now offering FREE monthly 1-hour virtual Zoom sessions for new, current, and prospective patients.        During these sessions, you can:   Learn about our practice, model of care, services   Get answers to questions about pregnancy and birth during [place] your provider's brain about anything else!    Sessions will be hosted by [employer] for [employer], Engineer, materials, Physicians and Midwives          No registration required      2021 Dates:      All at 6pm     October 21st     November 18th   December 16th     January 20th  February 17th    To join one of these meetings, a few minutes before it is set to start:     Copy/paste the link into your web browser:  [email or web address]    OR  Scan the QR code below (open up your camera and point towards QR code; click on tab that pops up on your phone ("zoom")

## 2020-12-17 NOTE — Addendum Note (Signed)
Addended by: Moss Mc on: 12/17/2020 03:35 PM   Modules accepted: Orders

## 2020-12-18 LAB — CBC/D/PLT+RPR+RH+ABO+RUB AB...
Antibody Screen: NEGATIVE
Basophils Absolute: 0 10*3/uL (ref 0.0–0.2)
Basos: 0 %
EOS (ABSOLUTE): 0.1 10*3/uL (ref 0.0–0.4)
Eos: 0 %
HCV Ab: <0.1 s/co ratio (ref 0.0–0.9)
HIV Screen 4th Generation wRfx: NONREACTIVE
Hematocrit: 39.1 % (ref 34.0–46.6)
Hemoglobin: 12.8 g/dL (ref 11.1–15.9)
Hepatitis B Surface Ag: NEGATIVE
Immature Grans (Abs): 0 10*3/uL (ref 0.0–0.1)
Immature Granulocytes: 0 %
Lymphocytes Absolute: 3.7 10*3/uL — ABNORMAL HIGH (ref 0.7–3.1)
Lymphs: 28 %
MCH: 29.6 pg (ref 26.6–33.0)
MCHC: 32.7 g/dL (ref 31.5–35.7)
MCV: 90 fL (ref 79–97)
Monocytes Absolute: 0.6 10*3/uL (ref 0.1–0.9)
Monocytes: 4 %
Neutrophils Absolute: 8.8 10*3/uL — ABNORMAL HIGH (ref 1.4–7.0)
Neutrophils: 68 %
Platelets: 386 10*3/uL (ref 150–450)
RBC: 4.33 x10E6/uL (ref 3.77–5.28)
RDW: 13.6 % (ref 11.7–15.4)
RPR Ser Ql: NONREACTIVE
Rh Factor: POSITIVE
Rubella Antibodies, IGG: 5.54 index (ref >0.99)
WBC: 13.2 10*3/uL — ABNORMAL HIGH (ref 3.4–10.8)

## 2020-12-18 LAB — COMPREHENSIVE METABOLIC PANEL
ALT: 14 IU/L (ref 0–32)
AST: 11 IU/L (ref 0–40)
Albumin/Globulin Ratio: 1.1 — ABNORMAL LOW (ref 1.2–2.2)
Albumin: 4 g/dL (ref 3.9–5.0)
Alkaline Phosphatase: 67 IU/L (ref 44–121)
BUN/Creatinine Ratio: 15 (ref 9–23)
BUN: 8 mg/dL (ref 6–20)
Bilirubin Total: <0.2 mg/dL (ref 0.0–1.2)
CO2: 20 mmol/L (ref 20–29)
Calcium: 9.8 mg/dL (ref 8.7–10.2)
Chloride: 100 mmol/L (ref 96–106)
Creatinine, Ser: 0.52 mg/dL — ABNORMAL LOW (ref 0.57–1.00)
GFR calc Af Amer: 155 mL/min/{1.73_m2} (ref >59)
GFR calc non Af Amer: 134 mL/min/{1.73_m2} (ref >59)
Globulin, Total: 3.6 g/dL (ref 1.5–4.5)
Glucose: 86 mg/dL (ref 65–99)
Potassium: 4.4 mmol/L (ref 3.5–5.2)
Sodium: 135 mmol/L (ref 134–144)
Total Protein: 7.6 g/dL (ref 6.0–8.5)

## 2020-12-18 LAB — HCV INTERPRETATION

## 2020-12-18 LAB — GC/CHLAMYDIA PROBE AMP
Chlamydia trachomatis, NAA: NEGATIVE
Neisseria Gonorrhoeae by PCR: NEGATIVE

## 2020-12-18 LAB — PROTEIN / CREATININE RATIO, URINE
Creatinine, Urine: 150.1 mg/dL
Protein, Ur: 14.2 mg/dL
Protein/Creat Ratio: 95 mg/g creat (ref 0–200)

## 2020-12-19 LAB — PMP SCREEN PROFILE (10S), URINE
Amphetamine Scrn, Ur: NEGATIVE ng/mL
BARBITURATE SCREEN URINE: NEGATIVE ng/mL
BENZODIAZEPINE SCREEN, URINE: NEGATIVE ng/mL
CANNABINOIDS UR QL SCN: NEGATIVE ng/mL
Cocaine (Metab) Scrn, Ur: NEGATIVE ng/mL
Creatinine(Crt), U: 156.6 mg/dL (ref 20.0–300.0)
Methadone Screen, Urine: NEGATIVE ng/mL
OXYCODONE+OXYMORPHONE UR QL SCN: NEGATIVE ng/mL
Opiate Scrn, Ur: NEGATIVE ng/mL
Ph of Urine: 7.7 (ref 4.5–8.9)
Phencyclidine Qn, Ur: NEGATIVE ng/mL
Propoxyphene Scrn, Ur: NEGATIVE ng/mL

## 2020-12-19 LAB — INTEGRATED 1
Crown Rump Length: 60.7 mm
Gest. Age on Collection Date: 12.3 weeks
Maternal Age at EDD: 25.2 yr
Nuchal Translucency (NT): 1.4 mm
Number of Fetuses: 1
PAPP-A Value: 1007.1 ng/mL
Weight: 226 [lb_av]

## 2020-12-19 LAB — URINE CULTURE

## 2020-12-22 NOTE — L&D Delivery Note (Addendum)
Delivery Note Alicia Adkins is a 25 y.o. G2P1001 at [redacted]w[redacted]d admitted for IOL secondary to [facility]   GBS Status:  Negative/-- (06/16 1400) Maximum Maternal Temperature: 98.2F ROM: 3h 50m with clear fluid  Labor course: Initial SVE: 2/60/-1. Augmentation with: AROM, Pitocin, Cytotec, and IP Foley. She then progressed to complete.   Birth: At 1213 a viable female was delivered via spontaneous vaginal delivery (Presentation: ROA). Nuchal cord present: No.  Shoulders and body delivered in usual fashion. Infant placed directly on mom's abdomen for bonding/skin-to-skin, baby dried and stimulated. Cord clamped x 2 after 1 minute and cut by patient's friend.  Cord blood collected.  The placenta separated spontaneously and delivered via gentle cord traction.  Pitocin infused rapidly IV per protocol. Lower uterine sweep was performed and TXA also administered given ongoing oozing. Fundus firm with massage. Placenta inspected; intact with a 3 VC.  Labia, perineum, vagina, and cervix were inspected, notable for hemostatic first degree laceration not requiring repair. Sponge and instrument count were correct x2.  Intrapartum complications: cHTN (no medications) Anesthesia:  epidural Episiotomy: none Lacerations:  hemostatic 1st degree laceration without need for repair Suture Repair:  none EBL (mL): 300  Infant: APGAR (1 MIN):  9 APGAR (5 MINS):  9 Infant weight: 3515 g  Mom to postpartum.  Baby to Couplet care / Skin to Skin. Placenta to L&D   Plans to Breastfeed Contraception: Depo-Provera injections vs Nexplanon Circumcision: N/A  Note sent to [facility]: FT for pp visit.  Littie Deeds, MD PGY-1 06/14/2021 12:30 PM  I was present and gloved for delivery of infant and placenta. I agree with resident's documentation as noted above.  Sheila Oats, MD OB Fellow, Faculty Practice 06/14/2021 2:53 PM

## 2020-12-27 ENCOUNTER — Telehealth: Payer: Self-pay | Admitting: Advanced Practice Midwife

## 2020-12-27 MED ORDER — ONDANSETRON 4 MG PO TBDP: 4.0000 mg | ORAL_TABLET | Freq: Four times a day (QID) | ORAL | 2 refills | Status: DC | PRN

## 2020-12-27 MED ORDER — DOXYLAMINE-PYRIDOXINE 10-10 MG PO TBEC: DELAYED_RELEASE_TABLET | ORAL | 6 refills | Status: DC

## 2020-12-27 NOTE — Telephone Encounter (Signed)
Rx diclegis and zofran

## 2020-12-27 NOTE — Telephone Encounter (Signed)
Patient called and wanted to know if a prescription could be called into her pharmacy for nausea. Clinical staff will follow up with patient.

## 2021-01-10 ENCOUNTER — Telehealth: Payer: Self-pay | Admitting: Advanced Practice Midwife

## 2021-01-10 NOTE — Telephone Encounter (Signed)
Patient wants a nurse to call her back.. Patient is experiencing stomach cramping. Per patient. Clinical staff will follow up with patient.

## 2021-01-10 NOTE — Telephone Encounter (Signed)
Pt presented to office. Discussed her symptoms. Advised that she should stay well hydrated and take tylenol as needed. Pt denies any bleeding or leaking of fluid. Patient asked if we could check babies heart beat. Patient was dopplered and bedside u/s showed cardiac activity. No other questions or concerns at this time.

## 2021-01-11 ENCOUNTER — Encounter (HOSPITAL_COMMUNITY): Payer: Self-pay | Admitting: Emergency Medicine

## 2021-01-11 ENCOUNTER — Other Ambulatory Visit: Payer: Self-pay

## 2021-01-11 ENCOUNTER — Emergency Department (HOSPITAL_COMMUNITY)
Admission: EM | Admit: 2021-01-11 | Discharge: 2021-01-11 | Disposition: A | Discharge status: Home / Self Care | Payer: Medicaid Other | Attending: Emergency Medicine | Admitting: Emergency Medicine

## 2021-01-11 DIAGNOSIS — R000 Tachycardia, unspecified: Secondary | ICD-10-CM | POA: Insufficient documentation

## 2021-01-11 DIAGNOSIS — Z7982 Long term (current) use of aspirin: Secondary | ICD-10-CM | POA: Diagnosis not present

## 2021-01-11 DIAGNOSIS — O98512 Other viral diseases complicating pregnancy, second trimester: Secondary | ICD-10-CM | POA: Insufficient documentation

## 2021-01-11 DIAGNOSIS — U071 COVID-19: Secondary | ICD-10-CM

## 2021-01-11 DIAGNOSIS — Z3A16 16 weeks gestation of pregnancy: Secondary | ICD-10-CM | POA: Diagnosis not present

## 2021-01-11 DIAGNOSIS — I10 Essential (primary) hypertension: Secondary | ICD-10-CM | POA: Diagnosis not present

## 2021-01-11 LAB — URINALYSIS, ROUTINE W REFLEX MICROSCOPIC
Bacteria, UA: NONE SEEN
Bilirubin Urine: NEGATIVE
Glucose, UA: NEGATIVE mg/dL
Hgb urine dipstick: NEGATIVE
Ketones, ur: 80 mg/dL — AB
Leukocytes,Ua: NEGATIVE
Nitrite: POSITIVE — AB
Protein, ur: 30 mg/dL — AB
Specific Gravity, Urine: 1.023 (ref 1.005–1.030)
pH: 6 (ref 5.0–8.0)

## 2021-01-11 LAB — SARS CORONAVIRUS 2 BY RT PCR (HOSPITAL ORDER, PERFORMED IN ~~LOC~~ HOSPITAL LAB): SARS Coronavirus 2: POSITIVE — AB

## 2021-01-11 MED ORDER — CEPHALEXIN 500 MG PO CAPS
500.0000 mg | ORAL_CAPSULE | Freq: Once | ORAL | Status: AC
  Administered 2021-01-11: 500 mg via ORAL
  Filled 2021-01-11: qty 1

## 2021-01-11 MED ORDER — CEPHALEXIN 500 MG PO CAPS: 500.0000 mg | ORAL_CAPSULE | Freq: Three times a day (TID) | ORAL | 0 refills | Status: DC

## 2021-01-11 MED ORDER — ACETAMINOPHEN 325 MG PO TABS
650.0000 mg | ORAL_TABLET | Freq: Once | ORAL | Status: AC
  Administered 2021-01-11: 650 mg via ORAL
  Filled 2021-01-11: qty 2

## 2021-01-11 NOTE — ED Triage Notes (Signed)
Pt c/o headache that started yesterday. Pt called OBGYN for abdominal cramping, they told her to take tylenol. Pt temp 101.2 in triage.

## 2021-01-11 NOTE — Discharge Instructions (Addendum)
Drink plenty of fluids.  Take Tylenol for body aches and/or fever.  Contact your OB office and see if you are allowed to take zinc 50 mg a day, vitamin C and vitamin D.  If you should start getting short of breath you need to be reevaluated.  If your oxygen is low they will need to admit you to the hospital.  Your urine looks like you may have a urinary tract infection.  Take the antibiotics until gone.  Keep your prenatal appointments.  Please let your OB doctor know about your COVID diagnosis.

## 2021-01-11 NOTE — ED Notes (Signed)
Pt c/o headache that has decreased after taking tylenol. Pt says the back of her neck hurts. Pt denies any other pain or symptoms.

## 2021-01-11 NOTE — ED Provider Notes (Addendum)
[facility] EMERGENCY DEPARTMENT Provider Note   CSN: [id number] Arrival date & time: 01/11/21  0133   Time seen 6:08 AM  History Chief Complaint  Patient presents with   Headache    Alicia Adkins is a 25 y.o. female.  HPI   Patient is G2 P1 Ab0, [redacted] weeks [facility]  She states she had a normal first pregnancy.  She states yesterday she started having diffuse abdominal pain that she describes as aching.  She denies nausea, vomiting, diarrhea, dysuria, or frequency.  She also states she has a frontal headache that she describes as a sharp jab that only lasts briefly.  It happens once every 1-2 hours.  She was unaware that she was having a fever.  She states she has a mild cough and rhinorrhea but denies sore throat, chest pain, or shortness of breath.  She states her coworkers at [employer] have been sick with COVID.  Patient states she has had the Moderna COVID-vaccine x2  PCP Patient, No Pcp Per [facility]  Past Medical History:  Diagnosis Date   Depression    Hypertension    Trichimoniasis 08/06/2018   +trich on pap, treated with flagyl, POC 8/26___    Patient Active Problem List   Diagnosis Date Noted   Chronic hypertension 12/17/2020   Supervision of high-risk pregnancy 12/12/2020   Depression 08/04/2018    Past Surgical History:  Procedure Laterality Date   WISDOM TOOTH EXTRACTION       OB History    Gravida  2   Para  1   Term  1   Preterm  0   AB  0   Living  1     SAB  0   IAB  0   Ectopic  0   Multiple  0   Live Births  1           Family History  Problem Relation Age of Onset   Heart attack Paternal Grandfather    Hypertension Maternal Grandmother    Cancer Mother        cervical   Diabetes Mother    Hypertension Mother     Social History   Tobacco Use   Smoking status: Never Smoker   Smokeless tobacco: Never Used  Vaping Use   Vaping Use: Never used  Substance Use Topics    Alcohol use: Not Currently    Comment: socially    Drug use: Not Currently    Types: Marijuana  Employed at [employer] Medications Prior to Admission medications   Medication Sig Start Date End Date Taking? Authorizing Provider  acetaminophen (TYLENOL) 500 MG tablet Take 500 mg by mouth every 6 (six) hours as needed for mild pain or headache. Patient not taking: Reported on 12/17/2020    [provider]  aspirin EC 81 MG tablet Take 2 tablets (162 mg total) by mouth daily. 12/17/20   Cresenzo-Dishmon, Scarlette Calico, CNM  Blood Pressure Monitor MISC For regular home bp monitoring during pregnancy 05/04/19   Cheral Marker, CNM  cephALEXin (KEFLEX) 500 MG capsule Take 1 capsule (500 mg total) by mouth 3 (three) times daily. 01/11/21   Devoria Albe, MD  Doxylamine-Pyridoxine (DICLEGIS) 10-10 MG TBEC Take 2 qhs; may also take one in am and one in afternoon prn nausea 12/27/20   Cresenzo-Dishmon, Scarlette Calico, CNM  ondansetron (ZOFRAN ODT) 4 MG disintegrating tablet Take 1 tablet (4 mg total) by mouth every 6 (six) hours as  needed for nausea. 12/27/20   Cresenzo-Dishmon, Scarlette Calico, CNM  Prenatal Vit-Fe Fumarate-FA (PRENATAL VITAMIN PO) Take by mouth.    [provider]    Allergies    Patient has no known allergies.  Review of Systems   Review of Systems  All other systems reviewed and are negative.   Physical Exam Updated Vital Signs BP 122/64    Pulse (!) 110    Temp 98.4 F (36.9 C) (Oral)    Resp 17    Ht 5\' 3"  (1.6 m)    Wt 102.5 kg    LMP 09/21/2020    SpO2 99%    BMI 40.03 kg/m   Physical Exam Vitals and nursing note reviewed.  Constitutional:      General: She is not in acute distress.    Appearance: Normal appearance. She is obese. She is not ill-appearing or toxic-appearing.  HENT:     Head: Normocephalic and atraumatic.     Right Ear: External ear normal.     Left Ear: External ear normal.  Eyes:     Extraocular Movements: Extraocular movements intact.      Conjunctiva/sclera: Conjunctivae normal.     Pupils: Pupils are equal, round, and reactive to light.  Cardiovascular:     Rate and Rhythm: Regular rhythm. Tachycardia present.     Pulses: Normal pulses.     Heart sounds: Normal heart sounds.  Pulmonary:     Effort: Pulmonary effort is normal. No respiratory distress.     Breath sounds: Normal breath sounds. No stridor. No wheezing, rhonchi or rales.  Abdominal:     General: Bowel sounds are normal.     Palpations: Abdomen is soft.     Tenderness: There is no abdominal tenderness.     Comments: Fetal heart rate was 156  Musculoskeletal:        General: Normal range of motion.     Cervical back: Normal range of motion.  Skin:    General: Skin is warm and dry.  Neurological:     General: No focal deficit present.     Mental Status: She is alert and oriented to person, place, and time.     Cranial Nerves: No cranial nerve deficit.  Psychiatric:        Mood and Affect: Mood normal.        Behavior: Behavior normal.        Thought Content: Thought content normal.     ED Results / Procedures / Treatments   Labs (all labs ordered are listed, but only abnormal results are displayed) Results for orders placed or performed during the hospital encounter of 01/11/21  SARS Coronavirus 2 by RT PCR (hospital order, performed in [facility] hospital lab) Nasopharyngeal Nasopharyngeal Swab   Specimen: Nasopharyngeal Swab  Result Value Ref Range   SARS Coronavirus 2 POSITIVE (A) NEGATIVE  Urinalysis, Routine w reflex microscopic Urine, Clean Catch  Result Value Ref Range   Color, Urine YELLOW YELLOW   APPearance HAZY (A) CLEAR   Specific Gravity, Urine 1.023 1.005 - 1.030   pH 6.0 5.0 - 8.0   Glucose, UA NEGATIVE NEGATIVE mg/dL   Hgb urine dipstick NEGATIVE NEGATIVE   Bilirubin Urine NEGATIVE NEGATIVE   Ketones, ur 80 (A) NEGATIVE mg/dL   Protein, ur 30 (A) NEGATIVE mg/dL   Nitrite POSITIVE (A) NEGATIVE   Leukocytes,Ua NEGATIVE  NEGATIVE   RBC / HPF 0-5 0 - 5 RBC/hpf   WBC, UA 0-5 0 - 5 WBC/hpf  Bacteria, UA NONE SEEN NONE SEEN   Squamous Epithelial / LPF 0-5 0 - 5   Mucus PRESENT    Hyaline Casts, UA PRESENT    Laboratory interpretation all normal except possible UTI   EKG None  Radiology No results found.    US Fetal Nuchal Translucency Measurement  Result Date: 12/18/2020 Clinical Impression and recommendations: I have reviewed the sonogram results above, combined with the patient's current clinical course, below are my impressions and any appropriate recommendations for management based on the sonographic findings. 1.  G2P1001  Estimated Date of Delivery: Estimated Date of Delivery: 06/28/21 by  LMP, early ultrasound and confirmed by today's sonographic dating 2.  Normal fetal sonographic findings, specifically normal nuchal translucency and present fetal nasal bone Additionally the cranium, both choroid plexuses, zygomatic arch, mandible, 4 equal extremities, stomach, bladder and anterior abdomen are all noted. 3.  Normal general sonographic findings Recommend routine prenatal care based on this sonogram or as clinically indicated Lazaro Arms 12/18/2020 11:46 AM                                                   Procedures Procedures (including critical care time)  Medications Ordered in ED Medications  cephALEXin (KEFLEX) capsule 500 mg (has no administration in time range)  acetaminophen (TYLENOL) tablet 650 mg (650 mg Oral Given 01/11/21 6283)    ED Course  I have reviewed the triage vital signs and the nursing notes.  Pertinent labs & imaging results that were available during my care of the patient were reviewed by me and considered in my medical decision making (see chart for details).    MDM Rules/Calculators/A&P                          I suspect patient probably has COVID, COVID testing was done.  Due to her complaints of abdominal pain urinalysis was done.  Patient's urine was  suspicious for UTI with positive nitrites.  She was started on cephalexin.  Final Clinical Impression(s) / ED Diagnoses Final diagnoses:  [redacted] weeks gestation of pregnancy  COVID-19 virus infection    Rx / DC Orders ED Discharge Orders         Ordered    cephALEXin (KEFLEX) 500 MG capsule  3 times daily,   Status:  Discontinued        01/11/21 0740    cephALEXin (KEFLEX) 500 MG capsule  3 times daily        01/11/21 0741          Plan discharge  Devoria Albe, MD, Concha Pyo, MD 01/11/21 1517    Devoria Albe, MD 01/11/21 [phone]

## 2021-01-13 LAB — URINE CULTURE

## 2021-01-14 ENCOUNTER — Telehealth: Payer: Self-pay | Admitting: Women's Health

## 2021-01-14 ENCOUNTER — Telehealth: Payer: Medicaid Other | Admitting: Women's Health

## 2021-01-14 NOTE — Telephone Encounter (Signed)
Patient not answering [facility] phone call & no VM set up.01/14/2021-[place]

## 2021-01-16 ENCOUNTER — Telehealth: Payer: Self-pay | Admitting: Obstetrics and Gynecology

## 2021-01-16 ENCOUNTER — Telehealth: Payer: Medicaid Other | Admitting: Obstetrics and Gynecology

## 2021-01-16 NOTE — Telephone Encounter (Signed)
Patient didn't answer phone call, LVM -01/16/2021-[place]

## 2021-01-23 ENCOUNTER — Encounter: Payer: Medicaid Other | Admitting: Women's Health

## 2021-01-23 ENCOUNTER — Other Ambulatory Visit: Payer: Self-pay

## 2021-01-24 NOTE — Progress Notes (Signed)
Pt was scheduled as mychart visit. Had a couple of no-shows, does not have bp cuff, has CHTN, currently [redacted]w[redacted]d, needs in person visit w/ anatomy u/s and 2nd IT, so today's visit cancelled and scheduled for 2/8 w/ anatomy u/s.  This encounter was created in error - please disregard.

## 2021-01-28 ENCOUNTER — Other Ambulatory Visit: Payer: Self-pay | Admitting: Advanced Practice Midwife

## 2021-01-28 DIAGNOSIS — Z363 Encounter for antenatal screening for malformations: Secondary | ICD-10-CM

## 2021-01-28 DIAGNOSIS — O10919 Unspecified pre-existing hypertension complicating pregnancy, unspecified trimester: Secondary | ICD-10-CM

## 2021-01-29 ENCOUNTER — Encounter: Payer: Self-pay | Admitting: Obstetrics & Gynecology

## 2021-01-29 ENCOUNTER — Other Ambulatory Visit: Payer: Self-pay

## 2021-01-29 ENCOUNTER — Ambulatory Visit (INDEPENDENT_AMBULATORY_CARE_PROVIDER_SITE_OTHER): Payer: Medicaid Other

## 2021-01-29 ENCOUNTER — Ambulatory Visit (INDEPENDENT_AMBULATORY_CARE_PROVIDER_SITE_OTHER): Payer: Medicaid Other | Admitting: Obstetrics & Gynecology

## 2021-01-29 VITALS — BP 127/76 | HR 89 | Wt 223.0 lb

## 2021-01-29 DIAGNOSIS — Z363 Encounter for antenatal screening for malformations: Secondary | ICD-10-CM | POA: Diagnosis not present

## 2021-01-29 DIAGNOSIS — O10919 Unspecified pre-existing hypertension complicating pregnancy, unspecified trimester: Secondary | ICD-10-CM

## 2021-01-29 DIAGNOSIS — O0992 Supervision of high risk pregnancy, unspecified, second trimester: Secondary | ICD-10-CM

## 2021-01-29 DIAGNOSIS — Z1389 Encounter for screening for other disorder: Secondary | ICD-10-CM

## 2021-01-29 DIAGNOSIS — I10 Essential (primary) hypertension: Secondary | ICD-10-CM

## 2021-01-29 DIAGNOSIS — Z3A18 18 weeks gestation of pregnancy: Secondary | ICD-10-CM

## 2021-01-29 LAB — POCT URINALYSIS DIPSTICK OB
Blood, UA: NEGATIVE
Glucose, UA: NEGATIVE
Ketones, UA: NEGATIVE
Leukocytes, UA: NEGATIVE
Nitrite, UA: NEGATIVE

## 2021-01-29 NOTE — Progress Notes (Signed)
LOW-RISK PREGNANCY VISIT Patient name: Alicia Adkins MRN [id number]  Date of birth: 1996-02-27 Chief Complaint:   Routine Prenatal Visit, High Risk Gestation, and Pregnancy Ultrasound  History of Present Illness:   Alicia Adkins is a 25 y.o. G66P1001 female at [redacted]w[redacted]d with an Estimated Date of Delivery: 06/28/21 being seen today for ongoing management of a low-risk pregnancy.  Depression screen [facility] 2/9 12/17/2020 05/04/2019 08/04/2018  Decreased Interest 0 2 0  Down, Depressed, Hopeless 0 2 2  PHQ - 2 Score 0 4 2  Altered sleeping 1 3 2   Tired, decreased energy 1 1 1   Change in appetite 0 1 3  Feeling bad or failure about yourself  0 0 1  Trouble concentrating 0 0 2  Moving slowly or fidgety/restless 0 0 0  Suicidal thoughts 0 0 0  PHQ-9 Score 2 9 11   Difficult doing work/chores - - Not difficult at all    Today she reports no complaints. Contractions: Not present. Vag. Bleeding: None.  Movement: Present. denies leaking of fluid. Review of Systems:   Pertinent items are noted in HPI Denies abnormal vaginal discharge w/ itching/odor/irritation, headaches, visual changes, shortness of breath, chest pain, abdominal pain, severe nausea/vomiting, or problems with urination or bowel movements unless otherwise stated above. Pertinent History Reviewed:  Reviewed past medical,surgical, social, obstetrical and family history.  Reviewed problem list, medications and allergies. Physical Assessment:   Vitals:   01/29/21 1541  BP: 127/76  Pulse: 89  Weight: 223 lb (101.2 kg)  Body mass index is 39.5 kg/m.        Physical Examination:   General appearance: Well appearing, and in no distress  Mental status: Alert, oriented to person, place, and time  Skin: Warm & dry  Cardiovascular: Normal heart rate noted  Respiratory: Normal respiratory effort, no distress  Abdomen: Soft, gravid, nontender  Pelvic: Cervical exam deferred         Extremities: Edema: None  Fetal Status:      Movement: Present    Chaperone: n/a    Results for orders placed or performed in visit on 01/29/21 (from the past 24 hour(s))  POC Urinalysis Dipstick OB   Collection Time: 01/29/21  3:39 PM  Result Value Ref Range   Color, UA     Clarity, UA     Glucose, UA Negative Negative   Bilirubin, UA     Ketones, UA neg    Spec Grav, UA     Blood, UA neg    pH, UA     POC,PROTEIN,UA Trace Negative, Trace, Small (1+), Moderate (2+), Large (3+), 4+   Urobilinogen, UA     Nitrite, UA neg    Leukocytes, UA Negative Negative   Appearance     Odor      Assessment & Plan:  1) Low-risk pregnancy G2P1001 at [redacted]w[redacted]d with an Estimated Date of Delivery: 06/28/21   2) ,    Meds: No orders of the defined types were placed in this encounter.  Labs/procedures today:   Plan:  Continue routine obstetrical care normal sonogram today Next visit: prefers in person    Reviewed: Preterm labor symptoms and general obstetric precautions including but not limited to vaginal bleeding, contractions, leaking of fluid and fetal movement were reviewed in detail with the patient.  All questions were answered. Has home bp cuff. Rx faxed to . Check bp weekly, let 03/29/21 know if >140/90.   Follow-up: No follow-ups on file.  Orders Placed This Encounter  Procedures  . INTEGRATED 2  . POC Urinalysis Dipstick OB    Lazaro Arms, MD 01/29/2021 4:08 PM

## 2021-01-29 NOTE — Progress Notes (Signed)
[place] 18+4 wks,breech,posterior placenta gr 0,normal ovaries,svp of fluid 5.6 cm,cx 3.7 cm,fhr 157 bpm,EFW 262 G 62%,anatomy complete,no obvious abnormalities

## 2021-02-26 ENCOUNTER — Encounter: Payer: Self-pay | Admitting: Obstetrics & Gynecology

## 2021-02-26 ENCOUNTER — Ambulatory Visit (INDEPENDENT_AMBULATORY_CARE_PROVIDER_SITE_OTHER): Payer: Medicaid Other | Admitting: Obstetrics & Gynecology

## 2021-02-26 ENCOUNTER — Other Ambulatory Visit: Payer: Self-pay

## 2021-02-26 VITALS — BP 123/70 | HR 98 | Wt 228.0 lb

## 2021-02-26 DIAGNOSIS — Z1389 Encounter for screening for other disorder: Secondary | ICD-10-CM

## 2021-02-26 DIAGNOSIS — O0990 Supervision of high risk pregnancy, unspecified, unspecified trimester: Secondary | ICD-10-CM

## 2021-02-26 DIAGNOSIS — O10919 Unspecified pre-existing hypertension complicating pregnancy, unspecified trimester: Secondary | ICD-10-CM

## 2021-02-26 LAB — POCT URINALYSIS DIPSTICK OB
Blood, UA: NEGATIVE
Glucose, UA: NEGATIVE
Ketones, UA: NEGATIVE
Leukocytes, UA: NEGATIVE
Nitrite, UA: NEGATIVE
POC,PROTEIN,UA: NEGATIVE

## 2021-02-26 NOTE — Addendum Note (Signed)
Addended by: Leilani Able, Colon Rueth A on: 02/26/2021 11:22 AM   Modules accepted: Orders

## 2021-02-26 NOTE — Progress Notes (Signed)
   LOW-RISK PREGNANCY VISIT Patient name: Alicia Adkins MRN [id number]  Date of birth: 02/09/1996 Chief Complaint:   Routine Prenatal Visit  History of Present Illness:   Alicia Adkins is a 25 y.o. G4P1001 female at [redacted]w[redacted]d with an Estimated Date of Delivery: 06/28/21 being seen today for ongoing management of a low-risk pregnancy.  Depression screen [facility] 2/9 12/17/2020 05/04/2019 08/04/2018  Decreased Interest 0 2 0  Down, Depressed, Hopeless 0 2 2  PHQ - 2 Score 0 4 2  Altered sleeping 1 3 2   Tired, decreased energy 1 1 1   Change in appetite 0 1 3  Feeling bad or failure about yourself  0 0 1  Trouble concentrating 0 0 2  Moving slowly or fidgety/restless 0 0 0  Suicidal thoughts 0 0 0  PHQ-9 Score 2 9 11   Difficult doing work/chores - - Not difficult at all    Today she reports no complaints. Contractions: Not present. Vag. Bleeding: None.  Movement: Present. denies leaking of fluid. Review of Systems:   Pertinent items are noted in HPI Denies abnormal vaginal discharge w/ itching/odor/irritation, headaches, visual changes, shortness of breath, chest pain, abdominal pain, severe nausea/vomiting, or problems with urination or bowel movements unless otherwise stated above. Pertinent History Reviewed:  Reviewed past medical,surgical, social, obstetrical and family history.  Reviewed problem list, medications and allergies. Physical Assessment:   Vitals:   02/26/21 1045  BP: 123/70  Pulse: 98  Weight: 228 lb (103.4 kg)  Body mass index is 40.39 kg/m.        Physical Examination:   General appearance: Well appearing, and in no distress  Mental status: Alert, oriented to person, place, and time  Skin: Warm & dry  Cardiovascular: Normal heart rate noted  Respiratory: Normal respiratory effort, no distress  Abdomen: Soft, gravid, nontender  Pelvic: Cervical exam deferred         Extremities: Edema: None  Fetal Status: Fetal Heart Rate (bpm): 142 Fundal Height: 23 cm  Movement: Present    Chaperone: n/a    No results found for this or any previous visit (from the past 24 hour(s)).  Assessment & Plan:  1) Low-risk pregnancy G2P1001 at [redacted]w[redacted]d with an Estimated Date of Delivery: 06/28/21   2) CHTN, by history, no meds required with normal BP at this point,    Meds: No orders of the defined types were placed in this encounter.  Labs/procedures today:   Plan:  Continue routine obstetrical care  Next visit: prefers in person    Reviewed: Preterm labor symptoms and general obstetric precautions including but not limited to vaginal bleeding, contractions, leaking of fluid and fetal movement were reviewed in detail with the patient.  All questions were answered. Has home bp cuff. Rx faxed to . Check bp weekly, let 04/28/21 know if >140/90.   Follow-up: Return in about 4 weeks (around 03/26/2021) for LROB, PN2.  No orders of the defined types were placed in this encounter.   08/29/21, MD 02/26/2021 11:19 AM

## 2021-03-26 ENCOUNTER — Ambulatory Visit (INDEPENDENT_AMBULATORY_CARE_PROVIDER_SITE_OTHER): Payer: Medicaid Other | Admitting: Obstetrics & Gynecology

## 2021-03-26 ENCOUNTER — Encounter: Payer: Self-pay | Admitting: Obstetrics & Gynecology

## 2021-03-26 ENCOUNTER — Other Ambulatory Visit: Payer: Self-pay

## 2021-03-26 ENCOUNTER — Other Ambulatory Visit: Payer: Medicaid Other

## 2021-03-26 VITALS — BP 138/79 | HR 100 | Wt 234.0 lb

## 2021-03-26 DIAGNOSIS — Z23 Encounter for immunization: Secondary | ICD-10-CM | POA: Diagnosis not present

## 2021-03-26 DIAGNOSIS — O0990 Supervision of high risk pregnancy, unspecified, unspecified trimester: Secondary | ICD-10-CM

## 2021-03-26 DIAGNOSIS — Z131 Encounter for screening for diabetes mellitus: Secondary | ICD-10-CM | POA: Diagnosis not present

## 2021-03-26 DIAGNOSIS — Z3A26 26 weeks gestation of pregnancy: Secondary | ICD-10-CM

## 2021-03-26 DIAGNOSIS — O0992 Supervision of high risk pregnancy, unspecified, second trimester: Secondary | ICD-10-CM | POA: Diagnosis not present

## 2021-03-26 DIAGNOSIS — O10919 Unspecified pre-existing hypertension complicating pregnancy, unspecified trimester: Secondary | ICD-10-CM

## 2021-03-26 NOTE — Progress Notes (Signed)
   LOW-RISK PREGNANCY VISIT Patient name: Alicia Adkins MRN [id number]  Date of birth: Nov 03, 1996 Chief Complaint:   Routine Prenatal Visit  History of Present Illness:   Alicia Adkins is a 25 y.o. G51P1001 female at [redacted]w[redacted]d with an Estimated Date of Delivery: 06/28/21 being seen today for ongoing management of a low-risk pregnancy.  Depression screen [facility] 2/9 03/26/2021 12/17/2020 05/04/2019 08/04/2018  Decreased Interest 0 0 2 0  Down, Depressed, Hopeless 0 0 2 2  PHQ - 2 Score 0 0 4 2  Altered sleeping 1 1 3 2   Tired, decreased energy 1 1 1 1   Change in appetite 0 0 1 3  Feeling bad or failure about yourself  0 0 0 1  Trouble concentrating 0 0 0 2  Moving slowly or fidgety/restless 0 0 0 0  Suicidal thoughts 0 0 0 0  PHQ-9 Score 2 2 9 11   Difficult doing work/chores - - - Not difficult at all    Today she reports no complaints. Contractions: Irritability. Vag. Bleeding: None.  Movement: Present. denies leaking of fluid. Review of Systems:   Pertinent items are noted in HPI Denies abnormal vaginal discharge w/ itching/odor/irritation, headaches, visual changes, shortness of breath, chest pain, abdominal pain, severe nausea/vomiting, or problems with urination or bowel movements unless otherwise stated above. Pertinent History Reviewed:  Reviewed past medical,surgical, social, obstetrical and family history.  Reviewed problem list, medications and allergies. Physical Assessment:   Vitals:   03/26/21 0854  BP: 138/79  Pulse: 100  Weight: 234 lb (106.1 kg)  Body mass index is 41.45 kg/m.        Physical Examination:   General appearance: Well appearing, and in no distress  Mental status: Alert, oriented to person, place, and time  Skin: Warm & dry  Cardiovascular: Normal heart rate noted  Respiratory: Normal respiratory effort, no distress  Abdomen: Soft, gravid, nontender  Pelvic: Cervical exam deferred         Extremities: Edema: None  Fetal Status:     Movement:  Present    Chaperone: n/a    No results found for this or any previous visit (from the past 24 hour(s)).  Assessment & Plan:  1) Low-risk pregnancy G2P1001 at [redacted]w[redacted]d with an Estimated Date of Delivery: 06/28/21   2) CHTN, no meds, begin weekly BPP 32 weeks, EFW 32, 36 weeks, if requires meds transition to full surveillance protocol, running 130s/80s at home   Meds: No orders of the defined types were placed in this encounter.  Labs/procedures today: PN2  Plan:  Continue routine obstetrical care  Next visit: prefers in person    Reviewed: Preterm labor symptoms and general obstetric precautions including but not limited to vaginal bleeding, contractions, leaking of fluid and fetal movement were reviewed in detail with the patient.  All questions were answered. Has home bp cuff. Rx faxed to . Check bp weekly, let 05/26/21 know if >140/90.   Follow-up: Return in about 4 weeks (around 04/23/2021) for LROB.  Orders Placed This Encounter  Procedures  . Tdap vaccine greater than or equal to 7yo IM    08/29/21, MD 03/26/2021 9:22 AM

## 2021-03-27 LAB — RPR: RPR Ser Ql: NONREACTIVE

## 2021-03-27 LAB — CBC
Hematocrit: 35.1 % (ref 34.0–46.6)
Hemoglobin: 11.4 g/dL (ref 11.1–15.9)
MCH: 30 pg (ref 26.6–33.0)
MCHC: 32.5 g/dL (ref 31.5–35.7)
MCV: 92 fL (ref 79–97)
Platelets: 313 10*3/uL (ref 150–450)
RBC: 3.8 x10E6/uL (ref 3.77–5.28)
RDW: 13.6 % (ref 11.7–15.4)
WBC: 12.9 10*3/uL — ABNORMAL HIGH (ref 3.4–10.8)

## 2021-03-27 LAB — HIV ANTIBODY (ROUTINE TESTING W REFLEX): HIV Screen 4th Generation wRfx: NONREACTIVE

## 2021-03-27 LAB — ANTIBODY SCREEN: Antibody Screen: NEGATIVE

## 2021-03-27 LAB — GLUCOSE TOLERANCE, 2 HOURS W/ 1HR
Glucose, 1 hour: 108 mg/dL (ref 65–179)
Glucose, 2 hour: 114 mg/dL (ref 65–152)
Glucose, Fasting: 74 mg/dL (ref 65–91)

## 2021-04-15 ENCOUNTER — Telehealth: Payer: Self-pay | Admitting: *Deleted

## 2021-04-15 NOTE — Telephone Encounter (Signed)
Patient states she was at work earlier and noticed she was having contractions.  Went home and they have now stopped.  Patient states she is drinking some water but probably not enough as it has been hot the last few days.  Encouraged to push some extra fluids as she may be slightly dehydrated.  If contractions continue or if she notices bleeding or leaking, to let us know or go to [facility]. Pt verbalized understanding.

## 2021-04-24 ENCOUNTER — Encounter: Payer: Self-pay | Admitting: Advanced Practice Midwife

## 2021-04-24 ENCOUNTER — Ambulatory Visit (INDEPENDENT_AMBULATORY_CARE_PROVIDER_SITE_OTHER): Payer: Medicaid Other | Admitting: Advanced Practice Midwife

## 2021-04-24 ENCOUNTER — Other Ambulatory Visit: Payer: Self-pay

## 2021-04-24 VITALS — BP 134/75 | HR 97 | Wt 233.0 lb

## 2021-04-24 DIAGNOSIS — O0993 Supervision of high risk pregnancy, unspecified, third trimester: Secondary | ICD-10-CM

## 2021-04-24 DIAGNOSIS — I10 Essential (primary) hypertension: Secondary | ICD-10-CM

## 2021-04-24 DIAGNOSIS — Z3A30 30 weeks gestation of pregnancy: Secondary | ICD-10-CM

## 2021-04-24 DIAGNOSIS — O10919 Unspecified pre-existing hypertension complicating pregnancy, unspecified trimester: Secondary | ICD-10-CM

## 2021-04-24 NOTE — Progress Notes (Signed)
   HIGH-RISK PREGNANCY VISIT Patient name: Alicia Adkins MRN [id number]  Date of birth: [birth year] Chief Complaint:   Routine Prenatal Visit (Having some abdominal tightness at work.she does a lot walking .)  History of Present Illness:   Alicia Adkins is a 25 y.o. G37P1001 female at [redacted]w[redacted]d with an Estimated Date of Delivery: 06/28/21 being seen today for ongoing management of a high-risk pregnancy complicated by chronic hypertension currently on no meds.  Today she reports having some BH ctx at work w standing.  Depression screen [facility] 2/9 03/26/2021 12/17/2020 05/04/2019 08/04/2018  Decreased Interest 0 0 2 0  Down, Depressed, Hopeless 0 0 2 2  PHQ - 2 Score 0 0 4 2  Altered sleeping 1 1 3 2   Tired, decreased energy 1 1 1 1   Change in appetite 0 0 1 3  Feeling bad or failure about yourself  0 0 0 1  Trouble concentrating 0 0 0 2  Moving slowly or fidgety/restless 0 0 0 0  Suicidal thoughts 0 0 0 0  PHQ-9 Score 2 2 9 11   Difficult doing work/chores - - - Not difficult at all    Contractions: Irregular.  .  Movement: Present. denies leaking of fluid.  Review of Systems:   Pertinent items are noted in HPI Denies abnormal vaginal discharge w/ itching/odor/irritation, headaches, visual changes, shortness of breath, chest pain, abdominal pain, severe nausea/vomiting, or problems with urination or bowel movements unless otherwise stated above. Pertinent History Reviewed:  Reviewed past medical,surgical, social, obstetrical and family history.  Reviewed problem list, medications and allergies. Physical Assessment:   Vitals:   04/24/21 1454  BP: 134/75  Pulse: 97  Weight: 233 lb (105.7 kg)  Body mass index is 41.27 kg/m.           Physical Examination:   General appearance: alert, well appearing, and in no distress  Mental status: alert, oriented to person, place, and time  Skin: warm & dry   Extremities: Edema: None    Cardiovascular: normal heart rate noted  Respiratory:  normal respiratory effort, no distress  Abdomen: gravid, soft, non-tender  Pelvic: Cervical exam deferred         Fetal Status: Fetal Heart Rate (bpm): 136 Fundal Height: 32 cm Movement: Present    Fetal Surveillance Testing today: doppler    No results found for this or any previous visit (from the past 24 hour(s)).  Assessment & Plan:  High-risk pregnancy: G2P1001 at [redacted]w[redacted]d with an Estimated Date of Delivery: 06/28/21   1) cHTN, stable without meds; per MFM, growth q 4wks, no testing starting at 32wks, IOL 38-39.6wks   Meds: No orders of the defined types were placed in this encounter.   Labs/procedures today: none  Treatment Plan:  Growth at 32 and 36wks; IOL 38-39.6wks  Reviewed: Preterm labor symptoms and general obstetric precautions including but not limited to vaginal bleeding, contractions, leaking of fluid and fetal movement were reviewed in detail with the patient.  All questions were answered. Does have home bp cuff. Office bp cuff given: not applicable. Check bp weekly, let 06/24/21 know if consistently >140 and/or >90.  Follow-up: Return in about 2 weeks (around 05/08/2021) for HROB, 08/29/21: EFW, in person.   No future appointments.  Orders Placed This Encounter  Procedures  . US OB Follow Up   05/10/2021 [facility] 04/24/2021 4:32 PM

## 2021-04-24 NOTE — Patient Instructions (Signed)
Alicia Adkins, I greatly value your feedback.  If you receive a survey following your visit with [place] today, we appreciate you taking the time to fill it out.  Thanks, Philipp Deputy, CNM   [facility] ([address] [place], [place] [address]) Entrance C, located off of [address] valet parking  Go to [email or web address] to register for FREE online childbirth classes   Call the office [phone]) or go to [facility] if:  You begin to have strong, frequent contractions  Your water breaks.  Sometimes it is a big gush of fluid, sometimes it is just a trickle that keeps getting your panties wet or running down your legs  You have vaginal bleeding.  It is normal to have a small amount of spotting if your cervix was checked.   You don't feel your baby moving like normal.  If you don't, get you something to eat and drink and lay down and focus on feeling your baby move.  You should feel at least 10 movements in 2 hours.  If you don't, you should call the office or go to [facility].    Tdap Vaccine  It is recommended that you get the Tdap vaccine during the third trimester of EACH pregnancy to help protect your baby from getting pertussis (whooping cough)  27-36 weeks is the BEST time to do this so that you can pass the protection on to your baby. During pregnancy is better than after pregnancy, but if you are unable to get it during pregnancy it will be offered at the hospital.   You can get this vaccine with [place], at the health department, your family doctor, or some local pharmacies  Everyone who will be around your baby should also be up-to-date on their vaccines before the baby comes. Adults (who are not pregnant) only need 1 dose of Tdap during adulthood.   [place] Pediatricians/Family Doctors:  [facility] [phone]            [facility] [phone]                 [facility]  [phone] (usually not accepting new patients unless you have family there already, you are always welcome to call and ask)       [facility] Department [phone]       [facility] Pediatricians/Family Doctors:   [facility]: [phone]  [facility]: [phone]  [facility]: [phone]  [facility] Doctors:   [facility]: [phone]   [facility]: [phone]  [facility] Doctors:  [facility]: [phone]   Home Blood Pressure Monitoring for Patients   Your provider has recommended that you check your blood pressure (BP) at least once a week at home. If you do not have a blood pressure cuff at home, one will be provided for you. Contact your provider if you have not received your monitor within 1 week.   Helpful Tips for Accurate Home Blood Pressure Checks  . Don't smoke, exercise, or drink caffeine 30 minutes before checking your BP . Use the restroom before checking your BP (a full bladder can raise your pressure) . Relax in a comfortable upright chair . Feet on the ground . Left arm resting comfortably on a flat surface at the level of your heart . Legs uncrossed . Back supported . Sit quietly and don't talk . Place the cuff on your bare  arm . Adjust snuggly, so that only two fingertips can fit between your skin and the top of the cuff . Check 2 readings separated by at least one minute . Keep a log of your BP readings . For a visual, please reference this diagram: http://ccnc.care/bpdiagram  Provider Name: [facility]     Phone: [phone]  Zone 1: ALL CLEAR  Continue to monitor your symptoms:  . BP reading is less than 140 (top number) or less than 90 (bottom number)  . No right upper stomach pain . No headaches or seeing spots . No feeling nauseated or throwing up . No swelling in face and hands  Zone 2: CAUTION Call your  doctor's office for any of the following:  . BP reading is greater than 140 (top number) or greater than 90 (bottom number)  . Stomach pain under your ribs in the middle or right side . Headaches or seeing spots . Feeling nauseated or throwing up . Swelling in face and hands  Zone 3: EMERGENCY  Seek immediate medical care if you have any of the following:  . BP reading is greater than160 (top number) or greater than 110 (bottom number) . Severe headaches not improving with Tylenol . Serious difficulty catching your breath . Any worsening symptoms from Zone 2   Third Trimester of Pregnancy The third trimester is from week 29 through week 42, months 7 through 9. The third trimester is a time when the fetus is growing rapidly. At the end of the ninth month, the fetus is about 20 inches in length and weighs 6-10 pounds.  BODY CHANGES Your body goes through many changes during pregnancy. The changes vary from woman to woman.   Your weight will continue to increase. You can expect to gain 25-35 pounds (11-16 kg) by the end of the pregnancy.  You may begin to get stretch marks on your hips, abdomen, and breasts.  You may urinate more often because the fetus is moving lower into your pelvis and pressing on your bladder.  You may develop or continue to have heartburn as a result of your pregnancy.  You may develop constipation because certain hormones are causing the muscles that push waste through your intestines to slow down.  You may develop hemorrhoids or swollen, bulging veins (varicose veins).  You may have pelvic pain because of the weight gain and pregnancy hormones relaxing your joints between the bones in your pelvis. Backaches may result from overexertion of the muscles supporting your posture.  You may have changes in your hair. These can include thickening of your hair, rapid growth, and changes in texture. Some women also have hair loss during or after pregnancy, or hair that  feels dry or thin. Your hair will most likely return to normal after your baby is born.  Your breasts will continue to grow and be tender. A yellow discharge may leak from your breasts called colostrum.  Your belly button may stick out.  You may feel short of breath because of your expanding uterus.  You may notice the fetus "dropping," or moving lower in your abdomen.  You may have a bloody mucus discharge. This usually occurs a few days to a week before labor begins.  Your cervix becomes thin and soft (effaced) near your due date. WHAT TO EXPECT AT YOUR PRENATAL EXAMS  You will have prenatal exams every 2 weeks until week 36. Then, you will have weekly prenatal exams. During a routine prenatal visit:  You  will be weighed to make sure you and the fetus are growing normally.  Your blood pressure is taken.  Your abdomen will be measured to track your baby's growth.  The fetal heartbeat will be listened to.  Any test results from the previous visit will be discussed.  You may have a cervical check near your due date to see if you have effaced. At around 36 weeks, your caregiver will check your cervix. At the same time, your caregiver will also perform a test on the secretions of the vaginal tissue. This test is to determine if a type of bacteria, Group B streptococcus, is present. Your caregiver will explain this further. Your caregiver may ask you:  What your birth plan is.  How you are feeling.  If you are feeling the baby move.  If you have had any abnormal symptoms, such as leaking fluid, bleeding, severe headaches, or abdominal cramping.  If you have any questions. Other tests or screenings that may be performed during your third trimester include:  Blood tests that check for low iron levels (anemia).  Fetal testing to check the health, activity level, and growth of the fetus. Testing is done if you have certain medical conditions or if there are problems during the  pregnancy. FALSE LABOR You may feel small, irregular contractions that eventually go away. These are called Braxton Hicks contractions, or false labor. Contractions may last for hours, days, or even weeks before true labor sets in. If contractions come at regular intervals, intensify, or become painful, it is best to be seen by your caregiver.  SIGNS OF LABOR   Menstrual-like cramps.  Contractions that are 5 minutes apart or less.  Contractions that start on the top of the uterus and spread down to the lower abdomen and back.  A sense of increased pelvic pressure or back pain.  A watery or bloody mucus discharge that comes from the vagina. If you have any of these signs before the 37th week of pregnancy, call your caregiver right away. You need to go to the hospital to get checked immediately. HOME CARE INSTRUCTIONS   Avoid all smoking, herbs, alcohol, and unprescribed drugs. These chemicals affect the formation and growth of the baby.  Follow your caregiver's instructions regarding medicine use. There are medicines that are either safe or unsafe to take during pregnancy.  Exercise only as directed by your caregiver. Experiencing uterine cramps is a good sign to stop exercising.  Continue to eat regular, healthy meals.  Wear a good support bra for breast tenderness.  Do not use hot tubs, steam rooms, or saunas.  Wear your seat belt at all times when driving.  Avoid raw meat, uncooked cheese, cat litter boxes, and soil used by cats. These carry germs that can cause birth defects in the baby.  Take your prenatal vitamins.  Try taking a stool softener (if your caregiver approves) if you develop constipation. Eat more high-fiber foods, such as fresh vegetables or fruit and whole grains. Drink plenty of fluids to keep your urine clear or pale yellow.  Take warm sitz baths to soothe any pain or discomfort caused by hemorrhoids. Use hemorrhoid cream if your caregiver approves.  If you  develop varicose veins, wear support hose. Elevate your feet for 15 minutes, 3-4 times a day. Limit salt in your diet.  Avoid heavy lifting, wear low heal shoes, and practice good posture.  Rest a lot with your legs elevated if you have leg cramps or low back  pain.  Visit your dentist if you have not gone during your pregnancy. Use a soft toothbrush to brush your teeth and be gentle when you floss.  A sexual relationship may be continued unless your caregiver directs you otherwise.  Do not travel far distances unless it is absolutely necessary and only with the approval of your caregiver.  Take prenatal classes to understand, practice, and ask questions about the labor and delivery.  Make a trial run to the hospital.  Pack your hospital bag.  Prepare the baby's nursery.  Continue to go to all your prenatal visits as directed by your caregiver. SEEK MEDICAL CARE IF:  You are unsure if you are in labor or if your water has broken.  You have dizziness.  You have mild pelvic cramps, pelvic pressure, or nagging pain in your abdominal area.  You have persistent nausea, vomiting, or diarrhea.  You have a bad smelling vaginal discharge.  You have pain with urination. SEEK IMMEDIATE MEDICAL CARE IF:   You have a fever.  You are leaking fluid from your vagina.  You have spotting or bleeding from your vagina.  You have severe abdominal cramping or pain.  You have rapid weight loss or gain.  You have shortness of breath with chest pain.  You notice sudden or extreme swelling of your face, hands, ankles, feet, or legs.  You have not felt your baby move in over an hour.  You have severe headaches that do not go away with medicine.  You have vision changes. Document Released: 12/02/2001 Document Revised: 12/13/2013 Document Reviewed: 02/08/2013 [facility] Patient Information 2015 [place], [place]. This information is not intended to replace advice given to you by your health  care provider. Make sure you discuss any questions you have with your health care provider.

## 2021-05-08 ENCOUNTER — Encounter: Payer: Medicaid Other | Admitting: Obstetrics and Gynecology

## 2021-05-08 ENCOUNTER — Ambulatory Visit (INDEPENDENT_AMBULATORY_CARE_PROVIDER_SITE_OTHER): Payer: Medicaid Other | Admitting: Obstetrics and Gynecology

## 2021-05-08 ENCOUNTER — Other Ambulatory Visit: Payer: Medicaid Other

## 2021-05-08 ENCOUNTER — Encounter: Payer: Self-pay | Admitting: Obstetrics and Gynecology

## 2021-05-08 ENCOUNTER — Other Ambulatory Visit: Payer: Self-pay

## 2021-05-08 VITALS — BP 120/77 | HR 102 | Wt 233.4 lb

## 2021-05-08 DIAGNOSIS — O0993 Supervision of high risk pregnancy, unspecified, third trimester: Secondary | ICD-10-CM

## 2021-05-08 DIAGNOSIS — I10 Essential (primary) hypertension: Secondary | ICD-10-CM

## 2021-05-08 DIAGNOSIS — Z1389 Encounter for screening for other disorder: Secondary | ICD-10-CM

## 2021-05-08 DIAGNOSIS — Z331 Pregnant state, incidental: Secondary | ICD-10-CM

## 2021-05-08 LAB — POCT URINALYSIS DIPSTICK OB
Blood, UA: NEGATIVE
Glucose, UA: NEGATIVE
Leukocytes, UA: NEGATIVE
Nitrite, UA: NEGATIVE

## 2021-05-08 NOTE — Patient Instructions (Signed)

## 2021-05-08 NOTE — Progress Notes (Signed)
Subjective:  Alicia Adkins is a 25 y.o. G2P1001 at [redacted]w[redacted]d being seen today for ongoing prenatal care.  She is currently monitored for the following issues for this high-risk pregnancy and has Depression; Supervision of high-risk pregnancy; and Chronic hypertension on their problem list.  Patient reports general discomforts of pregnancy.  Contractions: Irregular.  .  Movement: Present. Denies leaking of fluid.   The following portions of the patient's history were reviewed and updated as appropriate: allergies, current medications, past family history, past medical history, past social history, past surgical history and problem list. Problem list updated.  Objective:   Vitals:   05/08/21 1516  BP: 120/77  Pulse: (!) 102  Weight: 233 lb 6.4 oz (105.9 kg)    Fetal Status:     Movement: Present     General:  Alert, oriented and cooperative. Patient is in no acute distress.  Skin: Skin is warm and dry. No rash noted.   Cardiovascular: Normal heart rate noted  Respiratory: Normal respiratory effort, no problems with respiration noted  Abdomen: Soft, gravid, appropriate for gestational age. Pain/Pressure: Absent     Pelvic:  Cervical exam deferred        Extremities: Normal range of motion.  Edema: None  Mental Status: Normal mood and affect. Normal behavior. Normal judgment and thought content.   Urinalysis:      Assessment and Plan:  Pregnancy: G2P1001 at [redacted]w[redacted]d  1. Supervision of high risk pregnancy in third trimester Stable   2. Chronic hypertension BP stable without meds Continue with qd ASA NST today, 130's, + acels, no decels, no ut ctx Continue with weekly antenatal testing and serial growth scans  Preterm labor symptoms and general obstetric precautions including but not limited to vaginal bleeding, contractions, leaking of fluid and fetal movement were reviewed in detail with the patient. Please refer to After Visit Summary for other counseling recommendations.   Return in about 2 weeks (around 05/22/2021) for OB visit, face to face, any provider.   Hermina Staggers, MD

## 2021-05-08 NOTE — Addendum Note (Signed)
Addended by: Federico Flake A on: 05/08/2021 04:18 PM   Modules accepted: Orders

## 2021-05-13 ENCOUNTER — Other Ambulatory Visit: Payer: Medicaid Other

## 2021-05-16 ENCOUNTER — Other Ambulatory Visit: Payer: Self-pay

## 2021-05-16 ENCOUNTER — Ambulatory Visit (INDEPENDENT_AMBULATORY_CARE_PROVIDER_SITE_OTHER): Payer: Medicaid Other

## 2021-05-16 ENCOUNTER — Other Ambulatory Visit: Payer: Medicaid Other

## 2021-05-16 ENCOUNTER — Ambulatory Visit (INDEPENDENT_AMBULATORY_CARE_PROVIDER_SITE_OTHER): Payer: Medicaid Other | Admitting: Advanced Practice Midwife

## 2021-05-16 VITALS — BP 129/70 | HR 105 | Wt 231.0 lb

## 2021-05-16 DIAGNOSIS — Z3A33 33 weeks gestation of pregnancy: Secondary | ICD-10-CM | POA: Diagnosis not present

## 2021-05-16 DIAGNOSIS — O0993 Supervision of high risk pregnancy, unspecified, third trimester: Secondary | ICD-10-CM

## 2021-05-16 DIAGNOSIS — O10919 Unspecified pre-existing hypertension complicating pregnancy, unspecified trimester: Secondary | ICD-10-CM

## 2021-05-16 DIAGNOSIS — I10 Essential (primary) hypertension: Secondary | ICD-10-CM

## 2021-05-16 DIAGNOSIS — Z1389 Encounter for screening for other disorder: Secondary | ICD-10-CM

## 2021-05-16 LAB — POCT URINALYSIS DIPSTICK OB
Blood, UA: NEGATIVE
Glucose, UA: NEGATIVE
Leukocytes, UA: NEGATIVE
Nitrite, UA: NEGATIVE

## 2021-05-16 NOTE — Progress Notes (Signed)
   HIGH-RISK PREGNANCY VISIT Patient name: Alicia Adkins MRN [id number]  Date of birth: 1996/04/19 Chief Complaint:   Routine Prenatal Visit, Pregnancy Ultrasound, and High Risk Gestation  History of Present Illness:   Alicia Adkins is a 25 y.o. G68P1001 female at [redacted]w[redacted]d with an Estimated Date of Delivery: 06/28/21 being seen today for ongoing management of a high-risk pregnancy complicated by [facility] currently on no meds Today she reports no complaints. Contractions: Irritability. Vag. Bleeding: None.  Movement: Present. denies leaking of fluid.  Review of Systems:   Pertinent items are noted in HPI Denies abnormal vaginal discharge w/ itching/odor/irritation, headaches, visual changes, shortness of breath, chest pain, abdominal pain, severe nausea/vomiting, or problems with urination or bowel movements unless otherwise stated above. Pertinent History Reviewed:  Reviewed past medical,surgical, social, obstetrical and family history.  Reviewed problem list, medications and allergies. Physical Assessment:   Vitals:   05/16/21 1140  BP: 129/70  Pulse: (!) 105  Weight: 231 lb (104.8 kg)  Body mass index is 40.92 kg/m.           Physical Examination:   General appearance: alert, well appearing, and in no distress  Mental status: alert, oriented to person, place, and time  Skin: warm & dry   Extremities: Edema: None    Cardiovascular: normal heart rate noted  Respiratory: normal respiratory effort, no distress  Abdomen: gravid, soft, non-tender  Pelvic: Cervical exam deferred         Fetal Status:     Movement: Present    Fetal Surveillance Testing today: [place] 33+6 wks,cephalic,posterior placenta gr 3,afi 19 cm,fhr 138 bpm,EFW 2715 g 88%,AC 98%   Results for orders placed or performed in visit on 05/16/21 (from the past 24 hour(s))  POC Urinalysis Dipstick OB   Collection Time: 05/16/21 11:41 AM  Result Value Ref Range   Color, UA     Clarity, UA     Glucose, UA Negative  Negative   Bilirubin, UA     Ketones, UA trace    Spec Grav, UA     Blood, UA neg    pH, UA     POC,PROTEIN,UA Trace Negative, Trace, Small (1+), Moderate (2+), Large (3+), 4+   Urobilinogen, UA     Nitrite, UA neg    Leukocytes, UA Negative Negative   Appearance     Odor      Assessment & Plan:  1) High-risk pregnancy G2P1001 at [redacted]w[redacted]d with an Estimated Date of Delivery: 06/28/21   2) CHTN, no meds, stable Treatment plan  Per updated MFM guidelines, EFW ~ 36 weeks, IOL 38-38.6 weeks, no antenatal testing  Meds: No orders of the defined types were placed in this encounter.   Labs/procedures today: BPP, EFW   Reviewed: Preterm labor symptoms and general obstetric precautions including but not limited to vaginal bleeding, contractions, leaking of fluid and fetal movement were reviewed in detail with the patient.  All questions were answered. Has home bp cuff.. Check bp weekly, let us know if >140/90.   Follow-up: No follow-ups on file.  Future Appointments  Date Time Provider Department Center  06/06/2021  9:00 AM [facility] [place] [facility] None  06/06/2021  9:50 AM Eure, Amaryllis Dyke, MD [facility]    Orders Placed This Encounter  Procedures  . POC Urinalysis Dipstick OB   Jacklyn Shell DNP, CNM 05/16/2021 11:44 AM

## 2021-05-16 NOTE — Progress Notes (Signed)
[place] 33+6 wks,cephalic,posterior placenta gr 3,afi 19 cm,fhr 138 bpm,EFW 2715 g 88%,AC 98%

## 2021-05-27 ENCOUNTER — Other Ambulatory Visit: Payer: Medicaid Other

## 2021-05-30 ENCOUNTER — Other Ambulatory Visit: Payer: Medicaid Other

## 2021-05-30 ENCOUNTER — Encounter: Payer: Medicaid Other | Admitting: Advanced Practice Midwife

## 2021-06-03 ENCOUNTER — Other Ambulatory Visit: Payer: Medicaid Other

## 2021-06-05 ENCOUNTER — Other Ambulatory Visit: Payer: Self-pay | Admitting: Advanced Practice Midwife

## 2021-06-05 DIAGNOSIS — O10919 Unspecified pre-existing hypertension complicating pregnancy, unspecified trimester: Secondary | ICD-10-CM

## 2021-06-06 ENCOUNTER — Encounter: Payer: Self-pay | Admitting: Obstetrics & Gynecology

## 2021-06-06 ENCOUNTER — Other Ambulatory Visit (HOSPITAL_COMMUNITY)
Admission: RE | Admit: 2021-06-06 | Discharge: 2021-06-06 | Disposition: A | Discharge status: Home / Self Care | Payer: Medicaid Other | Source: Ambulatory Visit | Attending: Obstetrics & Gynecology | Admitting: Obstetrics & Gynecology

## 2021-06-06 ENCOUNTER — Ambulatory Visit (INDEPENDENT_AMBULATORY_CARE_PROVIDER_SITE_OTHER): Payer: Medicaid Other | Admitting: Obstetrics & Gynecology

## 2021-06-06 ENCOUNTER — Other Ambulatory Visit: Payer: Medicaid Other

## 2021-06-06 ENCOUNTER — Other Ambulatory Visit: Payer: Self-pay

## 2021-06-06 ENCOUNTER — Ambulatory Visit (INDEPENDENT_AMBULATORY_CARE_PROVIDER_SITE_OTHER): Payer: Medicaid Other

## 2021-06-06 VITALS — BP 129/76 | HR 95 | Wt 237.0 lb

## 2021-06-06 DIAGNOSIS — Z3A36 36 weeks gestation of pregnancy: Secondary | ICD-10-CM | POA: Insufficient documentation

## 2021-06-06 DIAGNOSIS — O0993 Supervision of high risk pregnancy, unspecified, third trimester: Secondary | ICD-10-CM | POA: Insufficient documentation

## 2021-06-06 DIAGNOSIS — I10 Essential (primary) hypertension: Secondary | ICD-10-CM

## 2021-06-06 DIAGNOSIS — O10919 Unspecified pre-existing hypertension complicating pregnancy, unspecified trimester: Secondary | ICD-10-CM | POA: Diagnosis not present

## 2021-06-06 LAB — POCT URINALYSIS DIPSTICK OB
Blood, UA: NEGATIVE
Glucose, UA: NEGATIVE
Ketones, UA: NEGATIVE
Leukocytes, UA: NEGATIVE
Nitrite, UA: NEGATIVE
POC,PROTEIN,UA: NEGATIVE

## 2021-06-06 NOTE — Progress Notes (Signed)
[place] 36+6 wks,cephalic,posterior placenta gr 3,afi 14.3 cm,fhr 148 bpm,EFW 3594 g 93%,AC 99%

## 2021-06-06 NOTE — Progress Notes (Signed)
HIGH-RISK PREGNANCY VISIT Patient name: Alicia Adkins MRN [id number]  Date of birth: 02-09-1996 Chief Complaint:   Routine Prenatal Visit  History of Present Illness:   Alicia Adkins is a 25 y.o. G32P1001 female at [redacted]w[redacted]d with an Estimated Date of Delivery: 06/28/21 being seen today for ongoing management of a high-risk pregnancy complicated by chronic hypertension currently on no meds, BP running 120s/70s at home.    Today she reports no complaints. Contractions: Irritability. Vag. Bleeding: None.  Movement: Present. denies leaking of fluid.   Depression screen [employer] 2/9 03/26/2021 12/17/2020 05/04/2019 08/04/2018  Decreased Interest 0 0 2 0  Down, Depressed, Hopeless 0 0 2 2  PHQ - 2 Score 0 0 4 2  Altered sleeping 1 1 3 2   Tired, decreased energy 1 1 1 1   Change in appetite 0 0 1 3  Feeling bad or failure about yourself  0 0 0 1  Trouble concentrating 0 0 0 2  Moving slowly or fidgety/restless 0 0 0 0  Suicidal thoughts 0 0 0 0  PHQ-9 Score 2 2 9 11   Difficult doing work/chores - - - Not difficult at all     GAD 7 : Generalized Anxiety Score 03/26/2021 12/17/2020  Nervous, Anxious, on Edge 0 0  Control/stop worrying 0 0  Worry too much - different things 0 1  Trouble relaxing 0 1  Restless 0 0  Easily annoyed or irritable 0 1  Afraid - awful might happen 0 0  Total GAD 7 Score 0 3     Review of Systems:   Pertinent items are noted in HPI Denies abnormal vaginal discharge w/ itching/odor/irritation, headaches, visual changes, shortness of breath, chest pain, abdominal pain, severe nausea/vomiting, or problems with urination or bowel movements unless otherwise stated above. Pertinent History Reviewed:  Reviewed past medical,surgical, social, obstetrical and family history.  Reviewed problem list, medications and allergies. Physical Assessment:   Vitals:   06/06/21 0933 06/06/21 0937  BP: (!) 143/93 129/76  Pulse: 94 95  Weight: 237 lb (107.5 kg)   Body mass  index is 41.98 kg/m.           Physical Examination:   General appearance: alert, well appearing, and in no distress  Mental status: alert, oriented to person, place, and time  Skin: warm & dry   Extremities: Edema: None    Cardiovascular: normal heart rate noted  Respiratory: normal respiratory effort, no distress  Abdomen: gravid, soft, non-tender  Pelvic: Cervical exam performed  Dilation: Closed Effacement (%): Thick Station: -3  Fetal Status: Fetal Heart Rate (bpm): 144 Fundal Height: 38 cm Movement: Present Presentation: Vertex  Fetal Surveillance Testing today: sonogram   Chaperone: 12/19/2020    Results for orders placed or performed in visit on 06/06/21 (from the past 24 hour(s))  POC Urinalysis Dipstick OB   Collection Time: 06/06/21  9:35 AM  Result Value Ref Range   Color, UA     Clarity, UA     Glucose, UA Negative Negative   Bilirubin, UA     Ketones, UA neg    Spec Grav, UA     Blood, UA neg    pH, UA     POC,PROTEIN,UA Negative Negative, Trace, Small (1+), Moderate (2+), Large (3+), 4+   Urobilinogen, UA     Nitrite, UA neg    Leukocytes, UA Negative Negative   Appearance     Odor      Assessment & Plan:  High-risk  pregnancy: G2P1001 at [redacted]w[redacted]d with an Estimated Date of Delivery: 06/28/21   1) CHTN, no meds, BP up just a bit today but has been running normal at home, she will take everyday in am and bring log in to visit,    Meds: No orders of the defined types were placed in this encounter.   Labs/procedures today: U/S  Treatment Plan:  weekly visits, if BP continues to be up recommend IOL   Reviewed: Term labor symptoms and general obstetric precautions including but not limited to vaginal bleeding, contractions, leaking of fluid and fetal movement were reviewed in detail with the patient.  All questions were answered. Does have home bp cuff. Office bp cuff given: yes. Check bp daily, let us know if consistently >140 and/or >90.  Follow-up:  Return in about 1 week (around 06/13/2021) for HROB.   No future appointments.  Orders Placed This Encounter  Procedures   Culture, beta strep (group b only)   POC Urinalysis Dipstick OB   Amaryllis Dyke Naftoli Penny 06/06/2021 10:29 AM

## 2021-06-07 ENCOUNTER — Telehealth: Payer: Self-pay | Admitting: *Deleted

## 2021-06-07 ENCOUNTER — Encounter: Payer: Self-pay | Admitting: *Deleted

## 2021-06-07 ENCOUNTER — Other Ambulatory Visit: Payer: Self-pay | Admitting: Obstetrics & Gynecology

## 2021-06-07 LAB — CERVICOVAGINAL ANCILLARY ONLY
Chlamydia: POSITIVE — AB
Comment: NEGATIVE
Comment: NORMAL
Neisseria Gonorrhea: NEGATIVE

## 2021-06-07 MED ORDER — AZITHROMYCIN 500 MG PO TABS: 1000.0000 mg | ORAL_TABLET | Freq: Once | ORAL | 1 refills | Status: AC

## 2021-06-07 NOTE — Telephone Encounter (Signed)
LMOVM for patient to check mychart message regarding labwork.

## 2021-06-10 ENCOUNTER — Other Ambulatory Visit: Payer: Medicaid Other

## 2021-06-10 LAB — CULTURE, BETA STREP (GROUP B ONLY): Strep Gp B Culture: NEGATIVE

## 2021-06-11 DIAGNOSIS — Z029 Encounter for administrative examinations, unspecified: Secondary | ICD-10-CM

## 2021-06-12 ENCOUNTER — Telehealth: Payer: Self-pay | Admitting: Women's Health

## 2021-06-12 NOTE — Telephone Encounter (Signed)
Pt took her BP this morning - 147/78 - pt wonders if this is high  Please advise & notify pt

## 2021-06-12 NOTE — Telephone Encounter (Signed)
Pt checked BP this am 147/78. BP was checked again around 1:30 pm, it was 144/79. Pt has had a slight headache. No blurred vision. + baby movement. I spoke with Selena Batten B and pt was advised to go to [facility] tonight. Pt voiced understanding. JSY

## 2021-06-13 ENCOUNTER — Other Ambulatory Visit: Payer: Medicaid Other

## 2021-06-13 ENCOUNTER — Ambulatory Visit (INDEPENDENT_AMBULATORY_CARE_PROVIDER_SITE_OTHER): Payer: Medicaid Other | Admitting: Obstetrics & Gynecology

## 2021-06-13 ENCOUNTER — Inpatient Hospital Stay (HOSPITAL_COMMUNITY)
Admission: AD | Admit: 2021-06-13 | Discharge: 2021-06-15 | DRG: 807 | Disposition: A | Discharge status: Home / Self Care | Payer: Medicaid Other | Attending: Obstetrics & Gynecology | Admitting: Obstetrics & Gynecology

## 2021-06-13 ENCOUNTER — Encounter: Payer: Self-pay | Admitting: Obstetrics & Gynecology

## 2021-06-13 ENCOUNTER — Encounter: Payer: Medicaid Other | Admitting: Women's Health

## 2021-06-13 ENCOUNTER — Other Ambulatory Visit: Payer: Self-pay

## 2021-06-13 ENCOUNTER — Encounter (HOSPITAL_COMMUNITY): Payer: Self-pay | Admitting: Advanced Practice Midwife

## 2021-06-13 VITALS — BP 135/81 | HR 90 | Wt 238.0 lb

## 2021-06-13 DIAGNOSIS — Z7982 Long term (current) use of aspirin: Secondary | ICD-10-CM

## 2021-06-13 DIAGNOSIS — O1002 Pre-existing essential hypertension complicating childbirth: Principal | ICD-10-CM | POA: Diagnosis present

## 2021-06-13 DIAGNOSIS — Z3A38 38 weeks gestation of pregnancy: Secondary | ICD-10-CM | POA: Diagnosis not present

## 2021-06-13 DIAGNOSIS — Z3490 Encounter for supervision of normal pregnancy, unspecified, unspecified trimester: Secondary | ICD-10-CM

## 2021-06-13 DIAGNOSIS — Z20822 Contact with and (suspected) exposure to covid-19: Secondary | ICD-10-CM | POA: Diagnosis not present

## 2021-06-13 DIAGNOSIS — I10 Essential (primary) hypertension: Secondary | ICD-10-CM | POA: Diagnosis present

## 2021-06-13 DIAGNOSIS — Z3A37 37 weeks gestation of pregnancy: Secondary | ICD-10-CM

## 2021-06-13 DIAGNOSIS — O0993 Supervision of high risk pregnancy, unspecified, third trimester: Secondary | ICD-10-CM

## 2021-06-13 DIAGNOSIS — F32A Depression, unspecified: Secondary | ICD-10-CM | POA: Diagnosis present

## 2021-06-13 DIAGNOSIS — Z331 Pregnant state, incidental: Secondary | ICD-10-CM

## 2021-06-13 DIAGNOSIS — O99214 Obesity complicating childbirth: Secondary | ICD-10-CM | POA: Diagnosis not present

## 2021-06-13 DIAGNOSIS — O113 Pre-existing hypertension with pre-eclampsia, third trimester: Secondary | ICD-10-CM | POA: Diagnosis not present

## 2021-06-13 DIAGNOSIS — Z1389 Encounter for screening for other disorder: Secondary | ICD-10-CM

## 2021-06-13 LAB — COMPREHENSIVE METABOLIC PANEL
ALT: 14 U/L (ref 0–44)
AST: 18 U/L (ref 15–41)
Albumin: 2.5 g/dL — ABNORMAL LOW (ref 3.5–5.0)
Alkaline Phosphatase: 102 U/L (ref 38–126)
Anion gap: 8 (ref 5–15)
BUN: <5 mg/dL — ABNORMAL LOW (ref 6–20)
CO2: 19 mmol/L — ABNORMAL LOW (ref 22–32)
Calcium: 8.9 mg/dL (ref 8.9–10.3)
Chloride: 110 mmol/L (ref 98–111)
Creatinine, Ser: 0.52 mg/dL (ref 0.44–1.00)
GFR, Estimated: >60 mL/min (ref >60)
Glucose, Bld: 143 mg/dL — ABNORMAL HIGH (ref 70–99)
Potassium: 3.4 mmol/L — ABNORMAL LOW (ref 3.5–5.1)
Sodium: 137 mmol/L (ref 135–145)
Total Bilirubin: 0.3 mg/dL (ref 0.3–1.2)
Total Protein: 6.1 g/dL — ABNORMAL LOW (ref 6.5–8.1)

## 2021-06-13 LAB — TYPE AND SCREEN
ABO/RH(D): B POS
Antibody Screen: NEGATIVE

## 2021-06-13 LAB — CBC
HCT: 31.3 % — ABNORMAL LOW (ref 36.0–46.0)
Hemoglobin: 10.5 g/dL — ABNORMAL LOW (ref 12.0–15.0)
MCH: 30.5 pg (ref 26.0–34.0)
MCHC: 33.5 g/dL (ref 30.0–36.0)
MCV: 91 fL (ref 80.0–100.0)
Platelets: 272 10*3/uL (ref 150–400)
RBC: 3.44 MIL/uL — ABNORMAL LOW (ref 3.87–5.11)
RDW: 14.1 % (ref 11.5–15.5)
WBC: 10.4 10*3/uL (ref 4.0–10.5)
nRBC: 0 % (ref 0.0–0.2)

## 2021-06-13 LAB — PROTEIN / CREATININE RATIO, URINE
Creatinine, Urine: 124.19 mg/dL
Protein Creatinine Ratio: 0.16 mg/mg{Cre} — ABNORMAL HIGH (ref 0.00–0.15)
Total Protein, Urine: 20 mg/dL

## 2021-06-13 LAB — RESP PANEL BY RT-PCR (FLU A&B, COVID) ARPGX2
Influenza A by PCR: NEGATIVE
Influenza B by PCR: NEGATIVE
SARS Coronavirus 2 by RT PCR: NEGATIVE

## 2021-06-13 LAB — POCT URINALYSIS DIPSTICK OB
Blood, UA: NEGATIVE
Glucose, UA: NEGATIVE
Leukocytes, UA: NEGATIVE
Nitrite, UA: NEGATIVE
POC,PROTEIN,UA: NEGATIVE

## 2021-06-13 MED ORDER — MISOPROSTOL 25 MCG QUARTER TABLET: 25.0000 ug | ORAL_TABLET | ORAL | Status: DC

## 2021-06-13 MED ORDER — LIDOCAINE HCL (PF) 1 % IJ SOLN: 30.0000 mL | INTRAMUSCULAR | Status: DC | PRN

## 2021-06-13 MED ORDER — ZOLPIDEM TARTRATE 5 MG PO TABS: 5.0000 mg | ORAL_TABLET | Freq: Every evening | ORAL | Status: DC | PRN

## 2021-06-13 MED ORDER — OXYTOCIN BOLUS FROM INFUSION
333.0000 mL | Freq: Once | INTRAVENOUS | Status: AC
  Administered 2021-06-14: 333 mL via INTRAVENOUS

## 2021-06-13 MED ORDER — LACTATED RINGERS IV SOLN
500.0000 mL | INTRAVENOUS | Status: DC | PRN
  Administered 2021-06-14: 500 mL via INTRAVENOUS

## 2021-06-13 MED ORDER — FENTANYL CITRATE (PF) 100 MCG/2ML IJ SOLN
100.0000 ug | INTRAMUSCULAR | Status: DC | PRN
  Administered 2021-06-14: 100 ug via INTRAVENOUS
  Filled 2021-06-13: qty 2

## 2021-06-13 MED ORDER — OXYCODONE-ACETAMINOPHEN 5-325 MG PO TABS
1.0000 | ORAL_TABLET | ORAL | Status: DC | PRN
Start: 2021-06-13 — End: 2021-06-14

## 2021-06-13 MED ORDER — TERBUTALINE SULFATE 1 MG/ML IJ SOLN: 0.2500 mg | Freq: Once | INTRAMUSCULAR | Status: DC | PRN

## 2021-06-13 MED ORDER — OXYTOCIN-SODIUM CHLORIDE 30-0.9 UT/500ML-% IV SOLN
2.5000 [IU]/h | INTRAVENOUS | Status: DC
  Filled 2021-06-13: qty 500

## 2021-06-13 MED ORDER — MISOPROSTOL 50MCG HALF TABLET
50.0000 ug | ORAL_TABLET | ORAL | Status: DC
  Administered 2021-06-13: 50 ug via BUCCAL
  Filled 2021-06-13: qty 1

## 2021-06-13 MED ORDER — MISOPROSTOL 25 MCG QUARTER TABLET
ORAL_TABLET | ORAL | Status: AC
  Administered 2021-06-13: 25 ug via VAGINAL
  Filled 2021-06-13: qty 1

## 2021-06-13 MED ORDER — SOD CITRATE-CITRIC ACID 500-334 MG/5ML PO SOLN: 30.0000 mL | ORAL | Status: DC | PRN

## 2021-06-13 MED ORDER — OXYTOCIN-SODIUM CHLORIDE 30-0.9 UT/500ML-% IV SOLN
1.0000 m[IU]/min | INTRAVENOUS | Status: DC
  Administered 2021-06-14: 2 m[IU]/min via INTRAVENOUS

## 2021-06-13 MED ORDER — ACETAMINOPHEN 325 MG PO TABS: 650.0000 mg | ORAL_TABLET | ORAL | Status: DC | PRN

## 2021-06-13 MED ORDER — ONDANSETRON HCL 4 MG/2ML IJ SOLN: 4.0000 mg | Freq: Four times a day (QID) | INTRAMUSCULAR | Status: DC | PRN

## 2021-06-13 MED ORDER — LACTATED RINGERS IV SOLN: INTRAVENOUS | Status: DC

## 2021-06-13 MED ORDER — ZOLPIDEM TARTRATE 5 MG PO TABS
5.0000 mg | ORAL_TABLET | Freq: Every evening | ORAL | Status: DC | PRN
  Administered 2021-06-13: 5 mg via ORAL
  Filled 2021-06-13: qty 1

## 2021-06-13 MED ORDER — OXYCODONE-ACETAMINOPHEN 5-325 MG PO TABS
2.0000 | ORAL_TABLET | ORAL | Status: DC | PRN
Start: 2021-06-13 — End: 2021-06-14

## 2021-06-13 NOTE — Progress Notes (Signed)
Patient Vitals for the past 4 hrs:  BP Temp Temp src Pulse  06/13/21 2222 (!) 146/64 -- -- 87  06/13/21 1959 (!) 150/79 98.4 F (36.9 C) Oral 88   Foley fell out around 0900.  FHR Cat 1, occ ctx.  Cx 5/thick/-3.  Cytotec placed vaginally.  Will start Pitocin in 4 hours.

## 2021-06-13 NOTE — Progress Notes (Signed)
HIGH-RISK PREGNANCY VISIT Patient name: Alicia Adkins MRN [id number]  Date of birth: 07-Apr-1996 Chief Complaint:   Routine Prenatal Visit (BP this morning 141/90)  History of Present Illness:   Alicia Adkins is a 24 y.o. G56P1001 female at [redacted]w[redacted]d with an Estimated Date of Delivery: 06/28/21 being seen today for ongoing management of a high-risk pregnancy complicated by: cHTN- no meds currently.    Pt called in yesterday with BP 140/70s and advised to go to MAU last night, which she did not do due to childcare concerns.  She had a mild HA at that time, denies HA or blurry vision currently. Per pt, this am BP was 141/90  Today she reports no complaints.   Contractions: Irritability.  .  Movement: Present. denies leaking of fluid.   Depression screen [facility] 2/9 03/26/2021 12/17/2020 05/04/2019 08/04/2018  Decreased Interest 0 0 2 0  Down, Depressed, Hopeless 0 0 2 2  PHQ - 2 Score 0 0 4 2  Altered sleeping 1 1 3 2   Tired, decreased energy 1 1 1 1   Change in appetite 0 0 1 3  Feeling bad or failure about yourself  0 0 0 1  Trouble concentrating 0 0 0 2  Moving slowly or fidgety/restless 0 0 0 0  Suicidal thoughts 0 0 0 0  PHQ-9 Score 2 2 9 11   Difficult doing work/chores - - - Not difficult at all     Current Outpatient Medications  Medication Instructions   acetaminophen (TYLENOL) 500 mg, Oral, Every 6 hours PRN   aspirin EC 162 mg, Oral, Daily   Blood Pressure Monitor MISC For regular home bp monitoring during pregnancy   Prenatal Vit-Fe Fumarate-FA (PRENATAL VITAMIN PO) Oral     Review of Systems:   Pertinent items are noted in HPI Denies abnormal vaginal discharge w/ itching/odor/irritation, headaches, visual changes, shortness of breath, chest pain, abdominal pain, severe nausea/vomiting, or problems with urination or bowel movements unless otherwise stated above. Pertinent History Reviewed:  Reviewed past medical,surgical, social, obstetrical and family history.   Reviewed problem list, medications and allergies. Physical Assessment:   Vitals:   06/13/21 1348  BP: 135/81  Pulse: 90  Weight: 238 lb (108 kg)  Body mass index is 42.16 kg/m.           Physical Examination:   General appearance: alert, well appearing, and in no distress  Mental status: alert, oriented to person, place, and time  Skin: warm & dry   Extremities: Edema: None    Cardiovascular: normal heart rate noted  Respiratory: normal respiratory effort, no distress  Abdomen: gravid, soft, non-tender  Pelvic: Cervical exam performed         Fetal Status: Fetal Heart Rate (bpm): 130 Fundal Height: 39 cm Movement: Present  SVE: difficult to reach due to fetal descent- suspect closed/soft/-3  Fetal Surveillance Testing today: doppler   Chaperone:  declined     Results for orders placed or performed in visit on 06/13/21 (from the past 24 hour(s))  POC Urinalysis Dipstick OB   Collection Time: 06/13/21  2:01 PM  Result Value Ref Range   Color, UA     Clarity, UA     Glucose, UA Negative Negative   Bilirubin, UA     Ketones, UA small    Spec Grav, UA     Blood, UA neg    pH, UA     POC,PROTEIN,UA Negative Negative, Trace, Small (1+), Moderate (2+), Large (3+), 4+  Urobilinogen, UA     Nitrite, UA neg    Leukocytes, UA Negative Negative   Appearance     Odor       Assessment & Plan:  High-risk pregnancy: G2P1001 at [redacted]w[redacted]d with an Estimated Date of Delivery: 06/28/21   1) chronic HTN -due to elevated BP, advised to go directly to [facility] for admission -Dr. Donavan Foil called for update  Meds: No orders of the defined types were placed in this encounter.   Labs/procedures today: none  Treatment Plan:  pt to go to hospital for IOL  Reviewed: Term labor symptoms and general obstetric precautions including but not limited to vaginal bleeding, contractions, leaking of fluid and fetal movement were reviewed in detail with the patient.  All questions were answered.     Follow-up: Return in about 1 week (around 06/20/2021) for going to hosptial today for IOL, please schedule 1wk BP check (RN visit).   Future Appointments  Date Time Provider Department Center  06/20/2021  2:50 PM [facility] NURSE [facility]    Orders Placed This Encounter  Procedures   POC Urinalysis Dipstick OB    Myna Hidalgo, DO Attending Obstetrician & Gynecologist, Faculty Practice Center for [facility], [facility]

## 2021-06-13 NOTE — H&P (Addendum)
OB ADMISSION/ HISTORY & PHYSICAL:  Admission Date: 06/13/2021  4:42 PM  Admit Diagnosis: Pregnancy [Z34.90]    Alicia Adkins is a 25 y.o. female presenting for Induction of labor for cHTN. Pt BP elevated (141/70s) per pt report last night with a HA. She was advised to report to MAU however d/t childcare was unable to go. She reported to the office today for ROB with BP of 141/90. Pt Directly admitted to [facility] for IOL for uncontrolled cHTN.  Upon admission, labile BPs, elevated x1 145/77.  She denies HA, BV,N/V, epigastric pain, BV, LOF.   Prenatal History: G2P1001   EDC : 06/28/2021, by Last Menstrual Period  [facility]ren[facility] at [facility] since  first trimester  Prenatal course complicated by: cHTN-off meds  Hx of SIPE- PP Mag Hx of VAVD- NRFHT  Prenatal Labs: ABO, Rh: B (12/27 1543)  Antibody: Negative (04/05 0825) Rubella: 5.54 (12/27 1543)  RPR: Non Reactive (04/05 0825)  HBsAg: Negative (12/27 1543)  HIV: Non Reactive (04/05 0825)  GBS: Negative/-- (06/16 1400)  2 hr Glucola : normal 114 Genetic Screening: None  Ultrasound: [place] 79+8 wks,cephalic,posterior placenta gr 3,afi 19 cm,fhr 138 bpm,EFW 2715 g 88%,AC 98%  Vaccines: TDaP          UTD         Flu             n/a                    COVID-19 ?     Maternal Diabetes: No Genetic Screening: Declined Maternal Ultrasounds/Referrals: Normal Fetal Ultrasounds or other Referrals:  None Maternal Substance Abuse:  No Significant Maternal Medications:  None Significant Maternal Lab Results:  Group B Strep negative Other Comments:  None  Medical / Surgical History :  Past medical history:  Past Medical History:  Diagnosis Date   Depression    Hypertension    Trichimoniasis 08/06/2018   +trich on pap, treated with flagyl, POC 8/26___     Past surgical history:  Past Surgical History:  Procedure Laterality Date   WISDOM TOOTH EXTRACTION       Family History:  Family History  Problem Relation Age of Onset    Heart attack Paternal Grandfather    Hypertension Maternal Grandmother    Cancer Mother        cervical   Diabetes Mother    Hypertension Mother      Social History:  reports that she has never smoked. She has never used smokeless tobacco. She reports previous alcohol use. She reports previous drug use. Drug: Marijuana.  Allergies: Patient has no known allergies.   Current Medications at time of admission:  Medications Prior to Admission  Medication Sig Dispense Refill Last Dose   acetaminophen (TYLENOL) 500 MG tablet Take 500 mg by mouth every 6 (six) hours as needed for mild pain or headache.      aspirin EC 81 MG tablet Take 2 tablets (162 mg total) by mouth daily. 60 tablet 6    Blood Pressure Monitor MISC For regular home bp monitoring during pregnancy 1 each 0    Prenatal Vit-Fe Fumarate-FA (PRENATAL VITAMIN PO) Take by mouth.        Review of Systems: Review of Systems  Constitutional:  Negative for chills, fever and weight loss.  HENT:  Negative for sore throat and tinnitus.   Eyes:  Negative for blurred vision and double vision.  Respiratory:  Negative for cough, shortness of  breath, wheezing and stridor.   Cardiovascular:  Negative for chest pain and palpitations.  Gastrointestinal:  Negative for abdominal pain, heartburn, nausea and vomiting.  Musculoskeletal:  Negative for falls and joint pain.  Neurological:  Negative for dizziness, tingling, seizures, loss of consciousness, weakness and headaches.  Psychiatric/Behavioral:  Negative for depression, hallucinations, substance abuse and suicidal ideas. The patient is not nervous/anxious.    Physical Exam: Vital signs and nursing notes reviewed.  Patient Vitals for the past 24 hrs:  Height Weight  06/13/21 1722 $RemoveBefor'5\' 3"'tgvyyWlULtmI$  (1.6 m) 108.4 kg     General: AAO x 3, NAD, coping well.  Heart: RRR Lungs:CTAB Abdomen: Gravid, NT, Leopold's vertex, confirmed by SVE.  Extremities: mild non-pitting edema Genitalia / VE: 2  cm Effacement: 60%  Station : -1/0 S. Payne, SNM   FHR: 140BPM, moderate variability, present  accels, absent decels TOCO: Ctx occasional with UI.   Labs:   Pending T&S, CBC, RPR  No results for input(s): WBC, HGB, HCT, PLT in the last 72 hours.   Assessment:  24 y.o. G2P1001 at [redacted]w[redacted]d cHTN-off meds  Hx of SIPE- PP Mag Hx of VAVD- NRFHT  1. IOL-CHTN- no meds  2. FHR category I 3. GBS negative  4. Desires epidural for pain management  5. Breastfeeding 6. Placenta disposal L&D   Plan: 1. Admit to BS 2. Routine L&D orders   -97mcg buccal cytotec   -Cook catheter inserted and inflated w/80cc H20  -Regular Diet   - Labile Bps. Justice labs drawn 3. Analgesia/anesthesia PRN   -Pt may have epidural upon request  4. Active management 5. Anticipate NSVB  Shantonette Isaias Sakai) Rollene Rotunda, BSN, RNC-OB  Student Nurse-Midwife   I personally saw and evaluated the patient, performing the key elements of the service. I developed and verified the management plan that is described in the resident's/student's note, and I agree with the content with my edits above. VSS, HRR&R, Resp unlabored, Legs neg.  Nigel Berthold, CNM 06/13/2021 7:41 PM     06/13/2021  7:32 PM

## 2021-06-14 ENCOUNTER — Encounter (HOSPITAL_COMMUNITY): Payer: Self-pay | Admitting: Advanced Practice Midwife

## 2021-06-14 ENCOUNTER — Inpatient Hospital Stay (HOSPITAL_COMMUNITY): Payer: Medicaid Other | Admitting: Anesthesiology

## 2021-06-14 DIAGNOSIS — Z3A38 38 weeks gestation of pregnancy: Secondary | ICD-10-CM | POA: Diagnosis not present

## 2021-06-14 DIAGNOSIS — O9902 Anemia complicating childbirth: Secondary | ICD-10-CM | POA: Diagnosis not present

## 2021-06-14 DIAGNOSIS — O164 Unspecified maternal hypertension, complicating childbirth: Secondary | ICD-10-CM | POA: Diagnosis not present

## 2021-06-14 DIAGNOSIS — D649 Anemia, unspecified: Secondary | ICD-10-CM | POA: Diagnosis not present

## 2021-06-14 DIAGNOSIS — O113 Pre-existing hypertension with pre-eclampsia, third trimester: Secondary | ICD-10-CM | POA: Diagnosis not present

## 2021-06-14 DIAGNOSIS — O119 Pre-existing hypertension with pre-eclampsia, unspecified trimester: Secondary | ICD-10-CM

## 2021-06-14 LAB — CBC
HCT: 32.4 % — ABNORMAL LOW (ref 36.0–46.0)
Hemoglobin: 10.6 g/dL — ABNORMAL LOW (ref 12.0–15.0)
MCH: 29.7 pg (ref 26.0–34.0)
MCHC: 32.7 g/dL (ref 30.0–36.0)
MCV: 90.8 fL (ref 80.0–100.0)
Platelets: 246 10*3/uL (ref 150–400)
RBC: 3.57 MIL/uL — ABNORMAL LOW (ref 3.87–5.11)
RDW: 14.3 % (ref 11.5–15.5)
WBC: 14.8 10*3/uL — ABNORMAL HIGH (ref 4.0–10.5)
nRBC: 0 % (ref 0.0–0.2)

## 2021-06-14 LAB — RPR: RPR Ser Ql: NONREACTIVE

## 2021-06-14 MED ORDER — LACTATED RINGERS IV SOLN
500.0000 mL | Freq: Once | INTRAVENOUS | Status: AC
Start: 2021-06-14 — End: 2021-06-14
  Administered 2021-06-14: 500 mL via INTRAVENOUS

## 2021-06-14 MED ORDER — SIMETHICONE 80 MG PO CHEW: 80.0000 mg | CHEWABLE_TABLET | ORAL | Status: DC | PRN

## 2021-06-14 MED ORDER — EPHEDRINE 5 MG/ML INJ: 10.0000 mg | INTRAVENOUS | Status: DC | PRN

## 2021-06-14 MED ORDER — FENTANYL-BUPIVACAINE-NACL 0.5-0.125-0.9 MG/250ML-% EP SOLN
12.0000 mL/h | EPIDURAL | Status: DC | PRN
Start: 2021-06-14 — End: 2021-06-14
  Administered 2021-06-14: 12 mL/h via EPIDURAL
  Filled 2021-06-14: qty 250

## 2021-06-14 MED ORDER — TRANEXAMIC ACID-NACL 1000-0.7 MG/100ML-% IV SOLN
INTRAVENOUS | Status: AC
  Filled 2021-06-14: qty 100

## 2021-06-14 MED ORDER — OXYCODONE HCL 5 MG PO TABS: 5.0000 mg | ORAL_TABLET | ORAL | Status: DC | PRN

## 2021-06-14 MED ORDER — LIDOCAINE HCL (PF) 1 % IJ SOLN
INTRAMUSCULAR | Status: DC | PRN
  Administered 2021-06-14 (×2): 5 mL via EPIDURAL

## 2021-06-14 MED ORDER — ONDANSETRON HCL 4 MG/2ML IJ SOLN: 4.0000 mg | INTRAMUSCULAR | Status: DC | PRN

## 2021-06-14 MED ORDER — PHENYLEPHRINE 40 MCG/ML (10ML) SYRINGE FOR IV PUSH (FOR BLOOD PRESSURE SUPPORT)
80.0000 ug | PREFILLED_SYRINGE | INTRAVENOUS | Status: DC | PRN
  Filled 2021-06-14: qty 10

## 2021-06-14 MED ORDER — DIBUCAINE (PERIANAL) 1 % EX OINT: 1.0000 "application " | TOPICAL_OINTMENT | CUTANEOUS | Status: DC | PRN

## 2021-06-14 MED ORDER — PHENYLEPHRINE 40 MCG/ML (10ML) SYRINGE FOR IV PUSH (FOR BLOOD PRESSURE SUPPORT): 80.0000 ug | PREFILLED_SYRINGE | INTRAVENOUS | Status: DC | PRN

## 2021-06-14 MED ORDER — SENNOSIDES-DOCUSATE SODIUM 8.6-50 MG PO TABS
2.0000 | ORAL_TABLET | Freq: Every day | ORAL | Status: DC
  Administered 2021-06-15: 2 via ORAL
  Filled 2021-06-14: qty 2

## 2021-06-14 MED ORDER — WITCH HAZEL-GLYCERIN EX PADS: 1.0000 "application " | MEDICATED_PAD | CUTANEOUS | Status: DC | PRN

## 2021-06-14 MED ORDER — OXYCODONE HCL 5 MG PO TABS: 10.0000 mg | ORAL_TABLET | ORAL | Status: DC | PRN

## 2021-06-14 MED ORDER — ACETAMINOPHEN 325 MG PO TABS
650.0000 mg | ORAL_TABLET | ORAL | Status: DC
  Administered 2021-06-14 – 2021-06-15 (×5): 650 mg via ORAL
  Filled 2021-06-14 (×5): qty 2

## 2021-06-14 MED ORDER — DIPHENHYDRAMINE HCL 25 MG PO CAPS: 25.0000 mg | ORAL_CAPSULE | Freq: Four times a day (QID) | ORAL | Status: DC | PRN

## 2021-06-14 MED ORDER — PRENATAL MULTIVITAMIN CH
1.0000 | ORAL_TABLET | Freq: Every day | ORAL | Status: DC
  Administered 2021-06-15: 1 via ORAL
  Filled 2021-06-14: qty 1

## 2021-06-14 MED ORDER — TRANEXAMIC ACID-NACL 1000-0.7 MG/100ML-% IV SOLN
1000.0000 mg | INTRAVENOUS | Status: AC
  Administered 2021-06-14: 1000 mg via INTRAVENOUS

## 2021-06-14 MED ORDER — TETANUS-DIPHTH-ACELL PERTUSSIS 5-2.5-18.5 LF-MCG/0.5 IM SUSY: 0.5000 mL | PREFILLED_SYRINGE | Freq: Once | INTRAMUSCULAR | Status: DC

## 2021-06-14 MED ORDER — DIPHENHYDRAMINE HCL 50 MG/ML IJ SOLN: 12.5000 mg | INTRAMUSCULAR | Status: DC | PRN

## 2021-06-14 MED ORDER — BENZOCAINE-MENTHOL 20-0.5 % EX AERO
1.0000 "application " | INHALATION_SPRAY | CUTANEOUS | Status: DC | PRN
  Administered 2021-06-14: 1 via TOPICAL
  Filled 2021-06-14 (×2): qty 56

## 2021-06-14 MED ORDER — COCONUT OIL OIL: 1.0000 "application " | TOPICAL_OIL | Status: DC | PRN

## 2021-06-14 MED ORDER — ONDANSETRON HCL 4 MG PO TABS: 4.0000 mg | ORAL_TABLET | ORAL | Status: DC | PRN

## 2021-06-14 MED ORDER — IBUPROFEN 600 MG PO TABS
600.0000 mg | ORAL_TABLET | Freq: Four times a day (QID) | ORAL | Status: DC
  Administered 2021-06-14 – 2021-06-15 (×4): 600 mg via ORAL
  Filled 2021-06-14 (×4): qty 1

## 2021-06-14 NOTE — Lactation Note (Signed)
This note was copied from a baby's chart. Lactation Consultation Note  Patient Name: Alicia Adkins TFTDD'U Date: 06/14/2021 Reason for consult: Initial assessment;Early term 37-38.6wks Age:25 hours, ETI female infant. Mom's feeding choice is breast and formula, but mom really wants to breastfeed she was unsuccessful with her one year old son. LC entered the room, infant was cuing to breastfeed. LC ask mom to remove nipple ring from her right breast prior to latching infant. Mom latched infant on her right breast using the football hold position, infant latched with depth, swallows observed and infant BF for 15 minutes. Afterwards infant was supplemented with 11 mls of formula using a slow flow bottle nipple ( purple and gold) mom's sister did the supplement while mom used the DEBP. LC discussed BF infant according to hunger cues, 8 to 12+ times or more within 24 hours, STS. LC discussed infant's input and output with Mom. Mom made aware of O/P services, breastfeeding support groups, community resources, and our phone # for post-discharge questions.   Mom's plan: 1- BF infant according to hunger cues. 2- Supplement infant with formula based on infant's age/ hours of life ( breastfeeding supplement sheet given) pace feeding infant. 3- Mom will pump every 3 hours for 15 minutes on initial setting, mom will offer any EBM first before supplementing infant with formula. 4- Mom knows to call RN or LC for latch assistance if needed.  Maternal Data Has patient been taught Hand Expression?: Yes Does the patient have breastfeeding experience prior to this delivery?: Yes How long did the patient breastfeed?: Per mom, she BF her one year old son in hospital only, she had latch difficulties infant refusing breast.  Feeding Mother's Current Feeding Choice: Breast Milk and Formula Nipple Type: Extra Slow Flow  LATCH Score Latch: Grasps breast easily, tongue down, lips flanged, rhythmical  sucking.  Audible Swallowing: Spontaneous and intermittent  Type of Nipple: Everted at rest and after stimulation  Comfort (Breast/Nipple): Soft / non-tender  Hold (Positioning): Assistance needed to correctly position infant at breast and maintain latch.  LATCH Score: 9   Lactation Tools Discussed/Used Tools: Pump;Flanges Flange Size: 27 Breast pump type: Double-Electric Breast Pump Pump Education: Setup, frequency, and cleaning;Milk Storage Reason for Pumping: To help establish milk supply. Pumping frequency: Mom will pump every 3 hours for 15 minutes on inital setting.  Interventions Interventions: Breast feeding basics reviewed;Assisted with latch;Skin to skin;Hand express;Adjust position;Support pillows;Position options;Expressed milk;Hand pump;DEBP;Education  Discharge Pump: DEBP;Manual WIC Program: Yes  Consult Status Consult Status: Follow-up Date: 06/15/21 Follow-up type: In-patient    Danelle Earthly 06/14/2021, 9:09 PM

## 2021-06-14 NOTE — Anesthesia Procedure Notes (Signed)
Epidural Patient location during procedure: OB Start time: 06/14/2021 10:55 AM End time: 06/14/2021 11:05 AM  Staffing Anesthesiologist: Leonides Grills, MD Performed: anesthesiologist   Preanesthetic Checklist Completed: patient identified, IV checked, site marked, risks and benefits discussed, monitors and equipment checked, pre-op evaluation and timeout performed  Epidural Patient position: sitting Prep: DuraPrep Patient monitoring: heart rate, cardiac monitor, continuous pulse ox and blood pressure Approach: midline Location: L4-L5 Injection technique: LOR air  Needle:  Needle type: Tuohy  Needle gauge: 17 G Needle length: 9 cm Needle insertion depth: 7 cm Catheter type: closed end flexible Catheter size: 19 Gauge Catheter at skin depth: 13 cm Test dose: negative and Other  Assessment Events: blood not aspirated, injection not painful, no injection resistance and negative IV test  Additional Notes Informed consent obtained prior to proceeding including risk of failure, 1% risk of PDPH, risk of minor discomfort and bruising. Discussed alternatives to epidural analgesia and patient desires to proceed.  Timeout performed pre-procedure verifying patient name, procedure, and platelet count.  Patient tolerated procedure well. Reason for block:procedure for pain

## 2021-06-14 NOTE — Progress Notes (Signed)
Labor Progress Note Alicia Adkins is a 25 y.o. G2P1001 at [redacted]w[redacted]d presented for IOL secondary to cHTN S: comfortable, not feeling much contractions  O:  BP (!) 156/71   Pulse 95   Temp 98.2 F (36.8 C) (Oral)   Resp 18   Ht 5\' 3"  (1.6 m)   Wt 108.4 kg   LMP 09/21/2020   BMI 42.34 kg/m  EFM: baseline 135/moderate variability/+accels/no decels Toco: every 2-4 min  CVE: Dilation: 6 Effacement (%): 80 Station: -1 Presentation: Vertex Exam by:: Dr. [email or web address]   A&P: 25 y.o. G2P1001 [redacted]w[redacted]d IOL for cHTN #IOL: Progressing well. S/p cervical ripening with FB and cytotec. Pitocin started at 0400, currently at 16 mu/min. Will continue to uptitrate. Now s/p AROM [redacted]w[redacted]d). #Pain: per patient request, can have epidural if desired #FWB: cat I #GBS negative #cHTN: not on medications. Intermittent mild range pressures, PEC labs unremarkable. Will continue to monitor.  ([id number], MD 9:08 AM

## 2021-06-14 NOTE — Progress Notes (Signed)
Patient Vitals for the past 4 hrs:  BP Pulse Resp  06/14/21 0631 136/75 77 --  06/14/21 0601 139/86 81 16  06/14/21 0531 130/80 68 --  06/14/21 0501 114/62 79 --  06/14/21 0431 129/69 83 --  06/14/21 0400 132/71 82 18  06/14/21 0354 136/68 80 --  06/14/21 0343 (!) 142/80 85 --  06/14/21 0256 (!) 129/54 80 --   Sleeping.  Pitocin at 63mu/min, increasing q 30 minutes. Mild and irregular ctx. FHR Cat 1.  Continue to increase pit until labor is adequate.

## 2021-06-14 NOTE — Discharge Summary (Addendum)
Postpartum Discharge Summary     Patient Name: Alicia Adkins DOB: May 22, 1996 MRN: [id number]  Date of admission: 06/13/2021 Delivery date:06/14/2021  Delivering provider: Randa Ngo  Date of discharge: 06/15/2021  Admitting diagnosis: Pregnancy [Z34.90] Intrauterine pregnancy: [redacted]w[redacted]d    Secondary diagnosis:  Principal Problem:   Vaginal delivery Active Problems:   Depression   Chronic hypertension   Pregnancy  Additional problems: as noted above Discharge diagnosis: Term Pregnancy Delivered                                              Post partum procedures: none Augmentation: AROM, Pitocin, Cytotec, and IP Foley Complications: None  Hospital course: Induction of Labor With Vaginal Delivery   25y.o. yo [id number] admitted to the hospital 06/13/2021 for induction of labor.  Indication for induction:  cHTN .  Patient had an uncomplicated labor course as follows: Membrane Rupture Time/Date: 8:58 AM ,06/14/2021   Delivery Method:Vaginal, Spontaneous  Episiotomy: None  Lacerations:   hemostatic first degree laceration without need for repair Details of delivery can be found in separate delivery note.  Patient had a routine postpartum course. She had a few elevated BP readings (see below), asymptomatic, meeting criteria for antihypertensive so was started on amlodipine 5 mg daily. Patient is discharged home 06/15/21.  Newborn Data: Birth date:06/14/2021  Birth time:12:13 PM  Gender:Female  Living status:Living  Apgars:9 ,9  Weight:7 lb 12 oz (3.515 kg)   Magnesium Sulfate received: No BMZ received: No Rhophylac:N/A MMR:N/A T-DaP:Given prenatally Flu: offered prior to discharge Transfusion:No  Physical exam  Vitals:   06/14/21 1526 06/14/21 1956 06/14/21 2349 06/15/21 0526  BP: 131/65 (!) 142/66 138/77 (!) 141/83  Pulse: 80 82 87 85  Resp: _0 Temp: 98.1 F (36.7 C) 98 F (36.7 C) 97.8 F (36.6 C) 98.1 F (36.7 C)  TempSrc: Oral Oral  Oral Oral  SpO2: 100% 99% 100%   Weight:      Height:       General: alert, cooperative, and no distress Lochia: appropriate Uterine Fundus: firm Incision: N/A DVT Evaluation: No significant calf/ankle edema. Labs: Lab Results  Component Value Date   WBC 14.8 (H) 06/14/2021   HGB 10.6 (L) 06/14/2021   HCT 32.4 (L) 06/14/2021   MCV 90.8 06/14/2021   PLT 246 06/14/2021   CMP Latest Ref Rng & Units 06/13/2021  Glucose 70 - 99 mg/dL 143(H)  BUN 6 - 20 mg/dL <5(L)  Creatinine 0.44 - 1.00 mg/dL 0.52  Sodium 135 - 145 mmol/L 137  Potassium 3.5 - 5.1 mmol/L 3.4(L)  Chloride 98 - 111 mmol/L 110  CO2 22 - 32 mmol/L 19(L)  Calcium 8.9 - 10.3 mg/dL 8.9  Total Protein 6.5 - 8.1 g/dL 6.1(L)  Total Bilirubin 0.3 - 1.2 mg/dL 0.3  Alkaline Phos 38 - 126 U/L 102  AST 15 - 41 U/L 18  ALT 0 - 44 U/L 14   Edinburgh Score: Edinburgh Postnatal Depression Scale Screening Tool 06/14/2021  I have been able to laugh and see the funny side of things. (No Data)  I have looked forward with enjoyment to things. -  I have blamed myself unnecessarily when things went wrong. -  I have been anxious or worried for no good reason. -  I have felt scared or panicky for no  good reason. -  Things have been getting on top of me. -  I have been so unhappy that I have had difficulty sleeping. -  I have felt sad or miserable. -  I have been so unhappy that I have been crying. -  The thought of harming myself has occurred to me. Flavia Shipper Postnatal Depression Scale Total -     After visit meds:  Allergies as of 06/15/2021   No Known Allergies      Medication List     STOP taking these medications    aspirin EC 81 MG tablet       TAKE these medications    acetaminophen 325 MG tablet Commonly known as: Tylenol Take 2 tablets (650 mg total) by mouth every 4 (four) hours. What changed:  medication strength how much to take when to take this reasons to take this   amLODipine 5 MG  tablet Commonly known as: NORVASC Take 1 tablet (5 mg total) by mouth daily.   Blood Pressure Monitor Misc For regular home bp monitoring during pregnancy   coconut oil Oil Apply 1 application topically as needed.   ibuprofen 600 MG tablet Commonly known as: ADVIL Take 1 tablet (600 mg total) by mouth every 6 (six) hours.   PRENATAL VITAMIN PO Take by mouth.        Discharge home in stable condition Infant Feeding:  breast and formula Infant Disposition:home with mother Discharge instruction: per After Visit Summary and Postpartum booklet. Activity: Advance as tolerated. Pelvic rest for 6 weeks.  Diet: routine diet Future Appointments: Future Appointments  Date Time Provider [place]  06/20/2021  2:50 PM [facility] NURSE [facility]   Follow up Visit: Message sent to [facility] by Dr. Astrid Drafts.  Please schedule this patient for a In person postpartum visit in 6 weeks with the following provider: Any provider. Additional Postpartum F/U:Postpartum Depression checkup and BP check 1 week  Low risk pregnancy complicated by:  cHTN (no meds) Delivery mode:  Vaginal, Spontaneous  Anticipated Birth Control:  Depo   Zola Button, MD    I have seen and examined this patient and agree with above documentation in the resident's note.     Renee Harder, MSN, CNM 06/15/21 11:36 AM

## 2021-06-14 NOTE — Anesthesia Preprocedure Evaluation (Signed)
Anesthesia Evaluation  Patient identified by MRN, date of birth, ID band Patient awake    Reviewed: Allergy & Precautions, H&P , NPO status , Patient's Chart, lab work & pertinent test results  History of Anesthesia Complications Negative for: history of anesthetic complications  Airway Mallampati: II  TM Distance: >3 FB Neck ROM: full    Dental no notable dental hx. (+) Teeth Intact   Pulmonary neg pulmonary ROS,    Pulmonary exam normal breath sounds clear to auscultation       Cardiovascular hypertension, Normal cardiovascular exam Rhythm:regular Rate:Normal     Neuro/Psych PSYCHIATRIC DISORDERS Depression negative neurological ROS     GI/Hepatic negative GI ROS, Neg liver ROS,   Endo/Other  Morbid obesity  Renal/GU negative Renal ROS  negative genitourinary   Musculoskeletal   Abdominal (+) + obese,   Peds  Hematology  (+) Blood dyscrasia, anemia ,   Anesthesia Other Findings   Reproductive/Obstetrics (+) Pregnancy                             Anesthesia Physical Anesthesia Plan  ASA: 3  Anesthesia Plan: Epidural   Post-op Pain Management:    Induction:   PONV Risk Score and Plan:   Airway Management Planned:   Additional Equipment:   Intra-op Plan:   Post-operative Plan:   Informed Consent: I have reviewed the patients History and Physical, chart, labs and discussed the procedure including the risks, benefits and alternatives for the proposed anesthesia with the patient or authorized representative who has indicated his/her understanding and acceptance.       Plan Discussed with:   Anesthesia Plan Comments:         Anesthesia Quick Evaluation

## 2021-06-14 NOTE — Anesthesia Postprocedure Evaluation (Signed)
Anesthesia Post Note  Patient: Alicia Adkins  Procedure(s) Performed: AN AD HOC LABOR EPIDURAL     Patient location during evaluation: Mother Baby Anesthesia Type: Epidural Level of consciousness: awake and alert, oriented and patient cooperative Pain management: pain level controlled Vital Signs Assessment: post-procedure vital signs reviewed and stable Respiratory status: spontaneous breathing Cardiovascular status: stable Postop Assessment: no headache, epidural receding, patient able to bend at knees and no signs of nausea or vomiting Anesthetic complications: no Comments: Pt. States she is able to walk.  Pain score 1.     No notable events documented.  Last Vitals:  Vitals:   06/14/21 1410 06/14/21 1526  BP: 137/69 131/65  Pulse: 83 80  Resp: 17 20  Temp: 36.6 C 36.7 C  SpO2: 100% 100%    Last Pain:  Vitals:   06/14/21 1623  TempSrc:   PainSc: 0-No pain   Pain Goal:                   [facility]

## 2021-06-14 NOTE — Social Work (Signed)
MOB was referred for history of depression and anxiety.   * Referral screened out by Clinical Social Worker because none of the following criteria appear to apply:  ~ History of anxiety/depression during this pregnancy, or of post-partum depression following prior delivery. No prenatal concerns noted. ~ Diagnosis of anxiety and/or depression within last 3 years. CSW reviewed chart and notes MOB reported in 2020 that she has never been diagnosed with depression. OR * MOB's symptoms currently being treated with medication and/or therapy.  Please contact the Clinical Social Worker if needs arise, by [facility] request, or if MOB scores greater than 9/yes to question 10 on Edinburgh Postpartum Depression Screen.  Manfred Arch, LCSWA Clinical Social Work [employer] and [employer]  [phone]

## 2021-06-15 MED ORDER — COCONUT OIL OIL
1.0000 | TOPICAL_OIL | 0 refills | Status: DC | PRN
Start: 2021-06-15 — End: 2021-09-12

## 2021-06-15 MED ORDER — AMLODIPINE BESYLATE 5 MG PO TABS: 5.0000 mg | ORAL_TABLET | Freq: Every day | ORAL | 2 refills | Status: DC

## 2021-06-15 MED ORDER — ACETAMINOPHEN 325 MG PO TABS: 650.0000 mg | ORAL_TABLET | ORAL | Status: DC

## 2021-06-15 MED ORDER — MEDROXYPROGESTERONE ACETATE 150 MG/ML IM SUSP
150.0000 mg | Freq: Once | INTRAMUSCULAR | Status: AC
  Administered 2021-06-15: 150 mg via INTRAMUSCULAR
  Filled 2021-06-15: qty 1

## 2021-06-15 MED ORDER — AMLODIPINE BESYLATE 5 MG PO TABS
5.0000 mg | ORAL_TABLET | Freq: Every day | ORAL | Status: DC
  Administered 2021-06-15: 5 mg via ORAL
  Filled 2021-06-15: qty 1

## 2021-06-15 MED ORDER — IBUPROFEN 600 MG PO TABS: 600.0000 mg | ORAL_TABLET | Freq: Four times a day (QID) | ORAL | 0 refills | Status: DC

## 2021-06-15 NOTE — Lactation Note (Signed)
This note was copied from a baby's chart. Lactation Consultation Note  Patient Name: Alicia Adkins [id number] Date: 06/15/2021 Reason for consult: Follow-up assessment;Mother's request;Term;Other (Comment) (GHTN) Age:25  Infant just finished at the breast according to mother 10 min just prior to [facility] arrival. [facility] encouraged Mother to offer both breasts with feeding, STS and look for swallows. LC encouraged Mom to pace bottle feed after each latch based on breastfeeding supplementation guidelines and hrs of age since birth. LC provided with form to track supplementation volume and to offer more if infant not latching at the breasts.   Mom using extra slow flow nipple offering 7-12 ml of EBM first followed by formula after latching.   Mom to follow up with [facility] and she will call on Monday for follow up.   Plan 1. To feed based on cues 8-12x in 24 hr period no more than 4 hrs without an attempt. Mom to offer both breasts during feeding, STS with compression and look for signs of swallowing.         2. Mom to pace bottle feeding with extra slow flow nipple after each latch with EBM first followed by formula via guidelines provided and reviewed as seen above.        3. Manual pump after latching 10 minutes each breast.            All questions answered at the end  of the visit.   Maternal Data    Feeding Mother's Current Feeding Choice: Breast Milk and Formula Nipple Type: Extra Slow Flow  LATCH Score                    Lactation Tools Discussed/Used Tools: Pump;Flanges Flange Size: 27 Breast pump type: Manual Reason for Pumping: increase stimulaton Pumping frequency: every 3 hrs for 10 minutes each breast after latching  Interventions Interventions: Breast feeding basics reviewed;Education;Hand pump;Breast massage;Skin to skin;Breast compression  Discharge Discharge Education: Engorgement and breast care;Warning signs for feeding baby Pump: Manual  Consult  Status Consult Status: Complete Date: 06/15/21    Alicia Adkins  Alicia Adkins 06/15/2021, 4:31 PM

## 2021-06-17 ENCOUNTER — Telehealth: Payer: Self-pay | Admitting: *Deleted

## 2021-06-17 ENCOUNTER — Other Ambulatory Visit: Payer: Medicaid Other

## 2021-06-17 ENCOUNTER — Encounter: Payer: Self-pay | Admitting: *Deleted

## 2021-06-17 NOTE — Telephone Encounter (Signed)
Transition Care Management Follow-up Telephone Call Date of discharge and from where: 06/15/2021 - [facility] How have you been since you were released from the hospital? "Doing good" Any questions or concerns? No  Items Reviewed: Did the pt receive and understand the discharge instructions provided? Yes  Medications obtained and verified? Yes  Other? No  Any new allergies since your discharge? No  Dietary orders reviewed? No Do you have support at home? Yes    Functional Questionnaire: (I = Independent and D = Dependent) ADLs: I  Bathing/Dressing- I  Meal Prep- I  Eating- I  Maintaining continence- I  Transferring/Ambulation- I  Managing Meds- I  Follow up appointments reviewed:  PCP Hospital f/u appt confirmed? No   Specialist Hospital f/u appt confirmed? Yes  Scheduled to see OBGYN on 06/20/2021 @ 1450. Are transportation arrangements needed? No  If their condition worsens, is the pt aware to call PCP or go to the Emergency Dept.? Yes Was the patient provided with contact information for the PCP's office or ED? Yes Was to pt encouraged to call back with questions or concerns? Yes

## 2021-06-20 ENCOUNTER — Encounter: Payer: Medicaid Other | Admitting: Obstetrics & Gynecology

## 2021-06-20 ENCOUNTER — Ambulatory Visit (INDEPENDENT_AMBULATORY_CARE_PROVIDER_SITE_OTHER): Payer: Medicaid Other | Admitting: *Deleted

## 2021-06-20 ENCOUNTER — Encounter: Payer: Self-pay | Admitting: *Deleted

## 2021-06-20 ENCOUNTER — Other Ambulatory Visit: Payer: Self-pay

## 2021-06-20 ENCOUNTER — Other Ambulatory Visit: Payer: Medicaid Other

## 2021-06-20 VITALS — BP 127/84 | HR 90 | Ht 63.0 in | Wt 214.5 lb

## 2021-06-20 DIAGNOSIS — Z0130 Encounter for examination of blood pressure without abnormal findings: Secondary | ICD-10-CM

## 2021-06-20 NOTE — Progress Notes (Addendum)
   NURSE VISIT- BLOOD PRESSURE CHECK  SUBJECTIVE:  Alicia Adkins is a 25 y.o. G24P2002 female here for BP check. She is postpartum, delivery date 06/14/21     HYPERTENSION ROS:  Pregnant/postpartum:  Severe headaches that don't go away with tylenol/other medicines: No  Visual changes (seeing spots/double/blurred vision) No  Severe pain under right breast breast or in center of upper chest No  Severe nausea/vomiting No  Taking medicines as instructed no. Pt hasn't had med since Saturday.   OBJECTIVE:  BP 127/84 (BP Location: Left Arm, Patient Position: Sitting, Cuff Size: Large)   Pulse 90   Ht 5\' 3"  (1.6 m)   Wt 214 lb 8 oz (97.3 kg)   LMP 09/21/2020   Breastfeeding Yes   BMI 38.00 kg/m   Appearance alert, well appearing, and in no distress.  ASSESSMENT: Postpartum  blood pressure check  PLAN: Discussed with 11/21/2020, CNM   Recommendations: no changes needed. Pt was advised to check BP once a day. If #'s start to go up, let Cathie Beams know and she may need to start back on BP med.   Follow-up:  schedule postpartum visit    [place]  06/20/2021 3:33 PM  Chart reviewed for nurse visit. Agree with plan of care.  06/22/2021, CNM 06/20/2021 8:00 PM

## 2021-06-26 ENCOUNTER — Telehealth (HOSPITAL_COMMUNITY): Payer: Self-pay | Admitting: *Deleted

## 2021-06-26 NOTE — Telephone Encounter (Signed)
Left message to return nurse call. Duffy Rhody, RN 06/26/2021 at 1:45pm

## 2021-06-27 ENCOUNTER — Encounter: Payer: Medicaid Other | Admitting: Women's Health

## 2021-06-27 ENCOUNTER — Other Ambulatory Visit: Payer: Medicaid Other

## 2021-07-17 ENCOUNTER — Ambulatory Visit: Payer: Medicaid Other | Admitting: Advanced Practice Midwife

## 2021-09-12 ENCOUNTER — Encounter: Payer: Self-pay | Admitting: Advanced Practice Midwife

## 2021-09-12 ENCOUNTER — Ambulatory Visit (INDEPENDENT_AMBULATORY_CARE_PROVIDER_SITE_OTHER): Payer: Medicaid Other | Admitting: Advanced Practice Midwife

## 2021-09-12 ENCOUNTER — Other Ambulatory Visit: Payer: Self-pay

## 2021-09-12 ENCOUNTER — Other Ambulatory Visit (HOSPITAL_COMMUNITY)
Admission: RE | Admit: 2021-09-12 | Discharge: 2021-09-12 | Disposition: A | Discharge status: Home / Self Care | Payer: Medicaid Other | Source: Ambulatory Visit | Attending: Advanced Practice Midwife | Admitting: Advanced Practice Midwife

## 2021-09-12 DIAGNOSIS — Z30013 Encounter for initial prescription of injectable contraceptive: Secondary | ICD-10-CM | POA: Diagnosis not present

## 2021-09-12 DIAGNOSIS — Z124 Encounter for screening for malignant neoplasm of cervix: Secondary | ICD-10-CM | POA: Diagnosis not present

## 2021-09-12 MED ORDER — MEDROXYPROGESTERONE ACETATE 150 MG/ML IM SUSP
150.0000 mg | Freq: Once | INTRAMUSCULAR | Status: AC
  Administered 2021-09-12: 150 mg via INTRAMUSCULAR

## 2021-09-12 NOTE — Progress Notes (Signed)
   Chief Complaint:   Postpartum Care (Needs Depo shot)  History of Present Illness:   Alicia Adkins is a 25 y.o. G2P2002 African American female being seen today for a postpartum visit. She is  12  weeks postpartum following a vacuum, outlet at 38 gestational weeks. IOL: yes, for chronic hypertension . Anesthesia: epidural.  Laceration: 1st degree.  Complications: none. Inpatient contraception: no.   Pregnancy complicated by CHTN . Tobacco use: no. Substance use disorder: no. Last pap smear: 4-19 and results were NILM w/ HRHPV negative. Next pap smear due: today No LMP recorded.  Postpartum course has been uncomplicated. Bleeding deop. Bowel function is normal. Bladder function is normal. Urinary incontinence? no, fecal incontinence? no Patient is sexually active. Desired contraception:  on depo . Patient does not want a pregnancy in the future.  Desired family size is 2 children.   Upstream - 09/12/21 1630       Pregnancy Intention Screening   Does the patient want to become pregnant in the next year? No    Does the patient's partner want to become pregnant in the next year? No    Would the patient like to discuss contraceptive options today? No      Contraception Wrap Up   Current Method Abstinence    End Method Hormonal Injection            The pregnancy intention screening data noted above was reviewed. Potential methods of contraception were discussed. The patient elected to proceed with Hormonal Injection.  Edinburgh Postpartum Depression Screening: negative  Edinburgh Postnatal Depression Scale - 09/12/21 1631       Edinburgh Postnatal Depression Scale:  In the Past 7 Days   I have been able to laugh and see the funny side of things. 0    I have looked forward with enjoyment to things. 0    I have blamed myself unnecessarily when things went wrong. 1    I have been anxious or worried for no good reason. 0    I have felt scared or panicky for no good  reason. 0    Things have been getting on top of me. 1    I have been so unhappy that I have had difficulty sleeping. 0    I have felt sad or miserable. 0    I have been so unhappy that I have been crying. 0    The thought of harming myself has occurred to me. 0    Edinburgh Postnatal Depression Scale Total 2            Baby's course has been uncomplicated. Baby is feeding by bottle. Infant has a pediatrician/family doctor? Yes.  Childcare strategy if returning to work/school: family.  Pt has material needs met for her and baby: Yes.   Review of Systems:   Pertinent items are noted in HPI Denies Abnormal vaginal discharge w/ itching/odor/irritation, headaches, visual changes, shortness of breath, chest pain, abdominal pain, severe nausea/vomiting, or problems with urination or bowel movements. Pertinent History Reviewed:  Reviewed past medical,surgical, obstetrical and family history.  Reviewed problem list, medications and allergies. OB History  Gravida Para Term Preterm AB Living  2 2 2 0 0 2  SAB IAB Ectopic Multiple Live Births  0 0 0 0 2    # Outcome Date GA Lbr Len/2nd Weight Sex Delivery Anes PTL Lv  2 Term 06/14/21 [redacted]w[redacted]d 02:56 / 00:17 3.515 kg F Vag-Spont EPI  LIV  1 Term   11/02/19 [redacted]w[redacted]d 18:48 / 00:55 3.27 kg M Vag-Vacuum EPI N LIV     Complications: Chronic hypertension   Physical Assessment:   Vitals:   09/12/21 1627  Weight: 95.9 kg  Height: 5' 3.5" (1.613 m)  Body mass index is 36.88 kg/m.         Physical Examination:   General:  Alert, oriented and cooperative.   Mental Status: Normal mood and affect perceived.   Rest of physical exam deferred due to type of encounter       No results found for this or any previous visit (from the past 24 hour(s)).  Assessment & Plan:  1) Postpartum exam 2) 12 wks s/p spontaneous vaginal delivery 3) bottle feeding 4) Depression screening 5) Contraception management: 2nd depo today  Essential components of care per ACOG  recommendations:  1.  Mood and well being:  If positive depression screen, discussed and plan developed.  If using tobacco we discussed reduction/cessation and risk of relapse If current substance abuse, we discussed and referral to local resources was offered.   2. Infant care and feeding:  If breastfeeding, discussed returning to work, pumping, breastfeeding-associated pain, guidance regarding return to fertility while lactating if not using another method. If needed, patient was provided with a letter to be allowed to pump q 2-3hrs to support lactation in a private location with access to a refrigerator to store breastmilk.   Recommended that all caregivers be immunized for flu, pertussis and other preventable communicable diseases If pt does not have material needs met for her/baby, referred to local resources for help obtaining these.  3. Sexuality, contraception and birth spacing Provided guidance regarding sexuality, management of dyspareunia, and resumption of intercourse Discussed avoiding interpregnancy interval <6mths and recommended birth spacing of 18 months  4. Sleep and fatigue Discussed coping options for fatigue and sleep disruption Encouraged family/partner/community support of 4 hrs of uninterrupted sleep to help with mood and fatigue  5. Physical recovery  If pt had a C/S, assessed incisional pain and providing guidance on normal vs prolonged recovery If pt had a laceration, perineal healing and pain reviewed.  If urinary or fecal incontinence, discussed management and referred to PT or uro/gyn if indicated  Patient is safe to resume physical activity. Discussed attainment of healthy weight.  6.  Chronic disease management Discussed pregnancy complications if any, and their implications for future childbearing and long-term maternal health. Review recommendations for prevention of recurrent pregnancy complications, such as 17 hydroxyprogesterone caproate to reduce risk  for recurrent PTB yes, or aspirin to reduce risk of preeclampsia not applicable. Pt had GDM: No. If yes, 2hr GTT scheduled: not applicable. Reviewed medications and non-pregnant dosing including consideration of whether pt is breastfeeding using a reliable resource such as LactMed: not applicable Referred for f/u w/ PCP or subspecialist providers as indicated: not applicable  7. Health maintenance Mammogram at 25yo or earlier if indicated Pap smears as indicated  Meds:  Meds ordered this encounter  Medications   medroxyPROGESTERone (DEPO-PROVERA) injection 150 mg     Follow-up: Return in about 12 weeks (around 12/05/2021) for depo.   No orders of the defined types were placed in this encounter.    Frances Cresenzo-Dishmon CNM, WHNP-BC 09/13/2021 8:41 AM  

## 2021-09-17 LAB — CYTOLOGY - PAP
Chlamydia: NEGATIVE
Comment: NEGATIVE
Comment: NEGATIVE
Comment: NORMAL
Diagnosis: NEGATIVE
High risk HPV: NEGATIVE
Neisseria Gonorrhea: NEGATIVE

## 2021-10-25 ENCOUNTER — Ambulatory Visit
Admission: EM | Admit: 2021-10-25 | Discharge: 2021-10-25 | Disposition: A | Discharge status: Home / Self Care | Payer: Medicaid Other | Attending: Family Medicine | Admitting: Family Medicine

## 2021-10-25 ENCOUNTER — Encounter: Payer: Self-pay | Admitting: Emergency Medicine

## 2021-10-25 ENCOUNTER — Other Ambulatory Visit: Payer: Self-pay

## 2021-10-25 DIAGNOSIS — J069 Acute upper respiratory infection, unspecified: Secondary | ICD-10-CM

## 2021-10-25 DIAGNOSIS — Z20828 Contact with and (suspected) exposure to other viral communicable diseases: Secondary | ICD-10-CM | POA: Diagnosis not present

## 2021-10-25 DIAGNOSIS — H66001 Acute suppurative otitis media without spontaneous rupture of ear drum, right ear: Secondary | ICD-10-CM | POA: Diagnosis not present

## 2021-10-25 MED ORDER — AMOXICILLIN 875 MG PO TABS: 875.0000 mg | ORAL_TABLET | Freq: Two times a day (BID) | ORAL | 0 refills | Status: DC

## 2021-10-25 MED ORDER — PSEUDOEPHEDRINE HCL 30 MG PO TABS: 30.0000 mg | ORAL_TABLET | Freq: Four times a day (QID) | ORAL | 0 refills | Status: DC | PRN

## 2021-10-25 MED ORDER — FLUTICASONE PROPIONATE 50 MCG/ACT NA SUSP: 1.0000 | Freq: Two times a day (BID) | NASAL | 2 refills | Status: DC

## 2021-10-25 NOTE — ED Provider Notes (Signed)
[facility]    CSN: [id number] Arrival date & time: 10/25/21  1057      History   Chief Complaint Chief Complaint  Patient presents with   Otalgia   Cough    HPI Alicia Adkins is a 25 y.o. female.   Presenting today with congestion, productive cough, fatigue for 4 days.  Now having bilateral ear pressure and significant right ear pain that is constant and sharp.  Trying cough medicine and over-the-counter eardrops with minimal relief.  Denies fever, chills, drainage from the ear, chest pain, shortness of breath.  Children recently sick with RSV.  No known pertinent chronic medical problems.   Past Medical History:  Diagnosis Date   Depression    Hypertension    Trichimoniasis 08/06/2018   +trich on pap, treated with flagyl, POC 8/26___    Patient Active Problem List   Diagnosis Date Noted   Pregnancy 06/13/2021   Vaginal delivery 11/02/2019   Depression 08/04/2018    Past Surgical History:  Procedure Laterality Date   WISDOM TOOTH EXTRACTION      OB History     Gravida  2   Para  2   Term  2   Preterm  0   AB  0   Living  2      SAB  0   IAB  0   Ectopic  0   Multiple  0   Live Births  2            Home Medications    Prior to Admission medications   Medication Sig Start Date End Date Taking? Authorizing Provider  amoxicillin (AMOXIL) 875 MG tablet Take 1 tablet (875 mg total) by mouth 2 (two) times daily. 10/25/21  Yes Particia Nearing, PA-C  fluticasone [facility]) 50 MCG/ACT nasal spray Place 1 spray into both nostrils 2 (two) times daily. 10/25/21  Yes Particia Nearing, PA-C  pseudoephedrine (SUDAFED) 30 MG tablet Take 1 tablet (30 mg total) by mouth every 6 (six) hours as needed for congestion. 10/25/21  Yes Particia Nearing, PA-C  acetaminophen (TYLENOL) 325 MG tablet Take 2 tablets (650 mg total) by mouth every 4 (four) hours. 06/15/21   Littie Deeds, MD  Blood Pressure Monitor MISC For regular  home bp monitoring during pregnancy 05/04/19   Cheral Marker, CNM  medroxyPROGESTERone (DEPO-PROVERA) 150 MG/ML injection Inject 150 mg into the muscle every 3 (three) months.    [provider]  Prenatal Vit-Fe Fumarate-FA (PRENATAL VITAMIN PO) Take by mouth.    [provider]    Family History Family History  Problem Relation Age of Onset   Heart attack Paternal Grandfather    Hypertension Maternal Grandmother    Cancer Mother        cervical   Diabetes Mother    Hypertension Mother     Social History Social History   Tobacco Use   Smoking status: Never   Smokeless tobacco: Never  Vaping Use   Vaping Use: Never used  Substance Use Topics   Alcohol use: Not Currently    Comment: socially    Drug use: Not Currently    Types: Marijuana     Allergies   Patient has no known allergies.   Review of Systems Review of Systems Per HPI  Physical Exam Triage Vital Signs ED Triage Vitals  Enc Vitals Group     BP 10/25/21 1309 120/90     Pulse Rate 10/25/21 1309 88  Resp 10/25/21 1309 16     Temp 10/25/21 1309 98.5 F (36.9 C)     Temp Source 10/25/21 1309 Oral     SpO2 10/25/21 1309 97 %     Weight --      Height --      Head Circumference --      Peak Flow --      Pain Score 10/25/21 1312 2     Pain Loc --      Pain Edu? --      Excl. in GC? --    No data found.  Updated Vital Signs BP 120/90 (BP Location: Right Arm)   Pulse 88   Temp 98.5 F (36.9 C) (Oral)   Resp 16   LMP  (LMP Unknown)   SpO2 97%   Breastfeeding No   Visual Acuity Right Eye Distance:   Left Eye Distance:   Bilateral Distance:    Right Eye Near:   Left Eye Near:    Bilateral Near:     Physical Exam Vitals and nursing note reviewed.  Constitutional:      Appearance: Normal appearance. She is not ill-appearing.  HENT:     Head: Atraumatic.     Ears:     Comments: Moderate middle ear effusion with mild TM injection left ear.  Right TM bulging,  erythematous without rupture.    Nose: Rhinorrhea present.     Mouth/Throat:     Mouth: Mucous membranes are moist.     Pharynx: No posterior oropharyngeal erythema.  Eyes:     Extraocular Movements: Extraocular movements intact.     Conjunctiva/sclera: Conjunctivae normal.  Cardiovascular:     Rate and Rhythm: Normal rate and regular rhythm.     Heart sounds: Normal heart sounds.  Pulmonary:     Effort: Pulmonary effort is normal. No respiratory distress.     Breath sounds: Normal breath sounds. No wheezing or rales.  Musculoskeletal:        General: Normal range of motion.     Cervical back: Normal range of motion and neck supple.  Skin:    General: Skin is warm and dry.  Neurological:     Mental Status: She is alert and oriented to person, place, and time.  Psychiatric:        Mood and Affect: Mood normal.        Thought Content: Thought content normal.        Judgment: Judgment normal.     UC Treatments / Results  Labs (all labs ordered are listed, but only abnormal results are displayed) Labs Reviewed  COVID-19, FLU A+B AND RSV    EKG   Radiology No results found.  Procedures Procedures (including critical care time)  Medications Ordered in UC Medications - No data to display  Initial Impression / Assessment and Plan / UC Course  I have reviewed the triage vital signs and the nursing notes.  Pertinent labs & imaging results that were available during my care of the patient were reviewed by me and considered in my medical decision making (see chart for details).     COVID, flu, RSV testing pending, suspect initially viral upper respiratory infection now with secondary right ear infection.  We will treat with amoxicillin, Flonase, Sudafed.  Discussed continued over-the-counter supportive medications and home care.  Return for acutely worsening symptoms.  Final Clinical Impressions(s) / UC Diagnoses   Final diagnoses:  RSV exposure  Viral URI with cough   Acute suppurative  otitis media of right ear without spontaneous rupture of tympanic membrane, recurrence not specified   Discharge Instructions   None    ED Prescriptions     Medication Sig Dispense Auth. Provider   amoxicillin (AMOXIL) 875 MG tablet Take 1 tablet (875 mg total) by mouth 2 (two) times daily. 20 tablet Particia Nearing, PA-C   fluticasone [facility]) 50 MCG/ACT nasal spray Place 1 spray into both nostrils 2 (two) times daily. 16 g Particia Nearing, [place]   pseudoephedrine (SUDAFED) 30 MG tablet Take 1 tablet (30 mg total) by mouth every 6 (six) hours as needed for congestion. 15 tablet Particia Nearing, [place]      PDMP not reviewed this encounter.   Particia Nearing, [place] 10/25/21 1423

## 2021-10-25 NOTE — ED Triage Notes (Signed)
Patient c/o bilateral ear pain and productive cough x 4 days.   Patient denies fever at home.   Patient endorses muffled sounds due to ear problem.   Patient states " I've been exposed to RSV through my kids".   Patient has taken OTC " ear drops and cough medicine" with some relief of symptoms.

## 2021-10-26 LAB — COVID-19, FLU A+B AND RSV
Influenza A, NAA: NOT DETECTED
Influenza B, NAA: NOT DETECTED
RSV, NAA: NOT DETECTED
SARS-CoV-2, NAA: NOT DETECTED

## 2021-12-04 ENCOUNTER — Ambulatory Visit: Payer: Medicaid Other

## 2021-12-05 ENCOUNTER — Ambulatory Visit: Payer: Medicaid Other

## 2022-01-16 DIAGNOSIS — H5213 Myopia, bilateral: Secondary | ICD-10-CM | POA: Diagnosis not present

## 2022-01-20 ENCOUNTER — Ambulatory Visit: Payer: Medicaid Other | Admitting: Adult Health

## 2022-01-27 ENCOUNTER — Ambulatory Visit: Payer: Medicaid Other | Admitting: Adult Health

## 2022-02-07 ENCOUNTER — Ambulatory Visit: Payer: Medicaid Other | Admitting: Adult Health

## 2022-02-12 ENCOUNTER — Ambulatory Visit: Payer: Medicaid Other | Admitting: Adult Health

## 2022-02-17 ENCOUNTER — Other Ambulatory Visit: Payer: Self-pay

## 2022-02-17 ENCOUNTER — Encounter: Payer: Self-pay | Admitting: Adult Health

## 2022-02-17 ENCOUNTER — Ambulatory Visit: Payer: Medicaid Other | Admitting: Adult Health

## 2022-02-17 VITALS — BP 131/80 | HR 96 | Ht 63.5 in | Wt 230.0 lb

## 2022-02-17 DIAGNOSIS — Z3042 Encounter for surveillance of injectable contraceptive: Secondary | ICD-10-CM | POA: Insufficient documentation

## 2022-02-17 DIAGNOSIS — Z30013 Encounter for initial prescription of injectable contraceptive: Secondary | ICD-10-CM

## 2022-02-17 DIAGNOSIS — Z113 Encounter for screening for infections with a predominantly sexual mode of transmission: Secondary | ICD-10-CM | POA: Diagnosis not present

## 2022-02-17 DIAGNOSIS — Z3202 Encounter for pregnancy test, result negative: Secondary | ICD-10-CM | POA: Diagnosis not present

## 2022-02-17 LAB — POCT URINE PREGNANCY: Preg Test, Ur: NEGATIVE

## 2022-02-17 MED ORDER — MEDROXYPROGESTERONE ACETATE 150 MG/ML IM SUSP: 150.0000 mg | INTRAMUSCULAR | 4 refills | Status: DC

## 2022-02-17 MED ORDER — MEDROXYPROGESTERONE ACETATE 150 MG/ML IM SUSP
150.0000 mg | Freq: Once | INTRAMUSCULAR | Status: AC
  Administered 2022-02-17: 150 mg via INTRAMUSCULAR

## 2022-02-17 NOTE — Progress Notes (Signed)
Depo Provera 150 mg given IM in left deltoid from office stock. Patient tolerated well. Next dose in 11-13 weeks.

## 2022-02-17 NOTE — Progress Notes (Signed)
°  Subjective:     Patient ID: Alicia Adkins, female   DOB: 1996-08-13, 26 y.o.   MRN: [id number]  HPI Alicia Adkins is a 26 year old black female,single, G2P2 in to discuss getting back on depo. She had sex 02/08/22, it was unprotected.  Lab Results  Component Value Date   DIAGPAP  09/12/2021    - Negative for intraepithelial lesion or malignancy (NILM)   HPVHIGH Negative 09/12/2021    Review of Systems Patient denies any headaches, hearing loss, fatigue, blurred vision, shortness of breath, chest pain, abdominal pain, problems with bowel movements, urination, or intercourse. No joint pain or mood swings.    Reviewed past medical,surgical, social and family history. Reviewed medications and allergies.  Objective:   Physical Exam BP 131/80 (BP Location: Right Arm, Patient Position: Sitting, Cuff Size: Normal)    Pulse 96    Ht 5' 3.5" (1.613 m)    Wt 230 lb (104.3 kg)    LMP 01/26/2022    Breastfeeding No    BMI 40.10 kg/m  UPT is negative. Skin warm and dry.  Lungs: clear to ausculation bilaterally. Cardiovascular: regular rate and rhythm.      Upstream - 02/17/22 1551       Pregnancy Intention Screening   Does the patient want to become pregnant in the next year? No    Would the patient like to discuss contraceptive options today? Yes      Contraception Wrap Up   Current Method No Contraceptive Precautions    End Method Hormonal Injection    Contraception Counseling Provided Yes                Assessment:     1. Encounter for Depo-Provera contraception Depo given today in office Use condoms  2. Encounter for initial prescription of injectable contraceptive Will rx depo    Meds ordered this encounter  Medications   medroxyPROGESTERone (DEPO-PROVERA) injection 150 mg   medroxyPROGESTERone (DEPO-PROVERA) 150 MG/ML injection    Sig: Inject 1 mL (150 mg total) into the muscle every 3 (three) months.    Dispense:  1 mL    Refill:  4    Order Specific Question:    Supervising Provider    Answer:   Lazaro Arms [[id number]]   She is aware she needs to pick up and bring to office every 12 weeks   3. Screening examination for STD (sexually transmitted disease) Urine sent for GC/CHL    Plan:     Return in 12 weeks for next depo

## 2022-02-19 ENCOUNTER — Telehealth: Payer: Self-pay | Admitting: Adult Health

## 2022-02-19 ENCOUNTER — Encounter: Payer: Self-pay | Admitting: Adult Health

## 2022-02-19 DIAGNOSIS — A749 Chlamydial infection, unspecified: Secondary | ICD-10-CM | POA: Insufficient documentation

## 2022-02-19 HISTORY — DX: Chlamydial infection, unspecified: A74.9

## 2022-02-19 LAB — GC/CHLAMYDIA PROBE AMP
Chlamydia trachomatis, NAA: POSITIVE — AB
Neisseria Gonorrhoeae by PCR: NEGATIVE

## 2022-02-19 MED ORDER — DOXYCYCLINE HYCLATE 100 MG PO TABS: 100.0000 mg | ORAL_TABLET | Freq: Two times a day (BID) | ORAL | 0 refills | Status: DC

## 2022-02-19 NOTE — Telephone Encounter (Signed)
Pt aware +chlamydia will rx doxycycline 100 mg 1 bid x 7 days, no sex POC in 4 weeks can do self swab, partner needs to be treated, she will tell him. [facility] sent  ?

## 2022-05-12 ENCOUNTER — Ambulatory Visit: Payer: Medicaid Other

## 2022-08-15 ENCOUNTER — Other Ambulatory Visit: Payer: Self-pay

## 2022-08-15 ENCOUNTER — Encounter (HOSPITAL_COMMUNITY): Payer: Self-pay

## 2022-08-15 ENCOUNTER — Emergency Department (HOSPITAL_COMMUNITY)
Admission: EM | Admit: 2022-08-15 | Discharge: 2022-08-15 | Disposition: A | Discharge status: Home / Self Care | Payer: Self-pay | Attending: Emergency Medicine | Admitting: Emergency Medicine

## 2022-08-15 DIAGNOSIS — R000 Tachycardia, unspecified: Secondary | ICD-10-CM | POA: Insufficient documentation

## 2022-08-15 DIAGNOSIS — R21 Rash and other nonspecific skin eruption: Secondary | ICD-10-CM | POA: Insufficient documentation

## 2022-08-15 MED ORDER — PREDNISONE 10 MG PO TABS: 20.0000 mg | ORAL_TABLET | Freq: Every day | ORAL | 0 refills | Status: DC

## 2022-08-15 MED ORDER — PREDNISONE 50 MG PO TABS
60.0000 mg | ORAL_TABLET | Freq: Once | ORAL | Status: AC
  Administered 2022-08-15: 60 mg via ORAL
  Filled 2022-08-15: qty 1

## 2022-08-15 NOTE — ED Triage Notes (Signed)
Pt to ED via POV c/o allergic reaction; symptoms that started yesterday. Hives noted to pt hands. Denies changes in soap or laundry detergent. Reports took Benadryl this morning.

## 2022-08-15 NOTE — Discharge Instructions (Signed)
Take 40 mg of prednisone daily for 5 days.  You can take Benadryl at night, take Claritin during the day to help prevent feeling lightheaded.  If the rash spreads, you have difficulty breathing or new concerning symptoms return to ED.

## 2022-08-15 NOTE — ED Provider Notes (Signed)
[facility] EMERGENCY DEPARTMENT Provider Note   CSN: [id number] Arrival date & time: 08/15/22  1923     History  Chief Complaint  Patient presents with   Allergic Reaction   Urticaria    Alicia Adkins is a 26 y.o. female.   Allergic Reaction Presenting symptoms: rash   Urticaria    Patient with no documented morbidities presents today due to rash.  Started yesterday, states she was washing her hands at [facility] where she works when she first noticed the rash.  Started having itching after that as well as the rash.  With some relief.  Denies any chest pain, shortness breath, vomiting, history of anaphylaxis, facial swelling.  endorses the rash was also on her chest initially started although up that resolved.  Home Medications Prior to Admission medications   Medication Sig Start Date End Date Taking? Authorizing Provider  doxycycline (VIBRA-TABS) 100 MG tablet Take 1 tablet (100 mg total) by mouth 2 (two) times daily. 02/19/22   Adline Potter, NP  medroxyPROGESTERone (DEPO-PROVERA) 150 MG/ML injection Inject 1 mL (150 mg total) into the muscle every 3 (three) months. 02/17/22   Adline Potter, NP      Allergies    Patient has no known allergies.    Review of Systems   Review of Systems  Skin:  Positive for rash.    Physical Exam Updated Vital Signs BP 136/86 (BP Location: Right Arm)   Pulse (!) 111   Temp 98.2 F (36.8 C) (Oral)   Resp 18   Ht 5\' 3"  (1.6 m)   Wt 107.2 kg   LMP 08/01/2022   SpO2 99%   BMI 41.86 kg/m  Physical Exam Vitals and nursing note reviewed. Exam conducted with a chaperone present.  Constitutional:      General: She is not in acute distress.    Appearance: Normal appearance.  HENT:     Head: Normocephalic and atraumatic.     Mouth/Throat:     Comments: Uvula is midline, no periorbital swelling Eyes:     General: No scleral icterus.    Extraocular Movements: Extraocular movements intact.     Pupils: Pupils are  equal, round, and reactive to light.  Cardiovascular:     Rate and Rhythm: Regular rhythm. Tachycardia present.     Pulses: Normal pulses.     Comments: Mild tachycardia Pulmonary:     Effort: Pulmonary effort is normal.     Breath sounds: No stridor.  Abdominal:     General: Abdomen is flat.     Tenderness: There is no abdominal tenderness.  Skin:    Capillary Refill: Capillary refill takes less than 2 seconds.     Coloration: Skin is not jaundiced.     Findings: Rash present.     Comments: Patient has erythematous macules to pams of hand, excorciation marks to dorsal aspect of hand.  No urticaria or oral mucosal involvement.  Rashes not present to torso or back.  3 dark papules to left sole of foot and right sole of foot but not consistent morphology too much on her hand.  Neurological:     Mental Status: She is alert. Mental status is at baseline.     Coordination: Coordination normal.     ED Results / Procedures / Treatments   Labs (all labs ordered are listed, but only abnormal results are displayed) Labs Reviewed - No data to display  EKG None  Radiology No results found.  Procedures Procedures  Medications Ordered in ED Medications - No data to display  ED Course/ Medical Decision Making/ A&P                           Medical Decision Making  Patient presents due to rash.  Differential includes but not limited to allergic reaction, anaphylaxis, hand-foot-and-mouth disease, atypical manifestation of untreated STD, contact of otitis.  No signs of anaphylaxis on exam, and normal phonation and protecting airway.  Given the pruritic nature of the lesions and recent exposure to new soap products at her place of employment I think this is most likely a localized contact dermatitis.  Localized allergic reaction also considered.    Will discharge with short course of prednisone taper.        Final Clinical Impression(s) / ED Diagnoses Final diagnoses:  None     Rx / DC Orders ED Discharge Orders     None         Theron Arista, PA-C 08/15/22 2321    Vanetta Mulders, MD 08/16/22 1723

## 2023-03-24 ENCOUNTER — Other Ambulatory Visit: Payer: BC Managed Care – PPO

## 2023-03-30 ENCOUNTER — Other Ambulatory Visit: Payer: BC Managed Care – PPO

## 2023-03-31 ENCOUNTER — Other Ambulatory Visit (HOSPITAL_COMMUNITY)
Admission: RE | Admit: 2023-03-31 | Discharge: 2023-03-31 | Disposition: A | Discharge status: Home / Self Care | Payer: BC Managed Care – PPO | Source: Ambulatory Visit | Attending: Obstetrics & Gynecology | Admitting: Obstetrics & Gynecology

## 2023-03-31 ENCOUNTER — Other Ambulatory Visit (INDEPENDENT_AMBULATORY_CARE_PROVIDER_SITE_OTHER): Payer: BC Managed Care – PPO | Admitting: *Deleted

## 2023-03-31 DIAGNOSIS — Z113 Encounter for screening for infections with a predominantly sexual mode of transmission: Secondary | ICD-10-CM | POA: Diagnosis present

## 2023-03-31 NOTE — Progress Notes (Signed)
   NURSE VISIT- VAGINITIS/STD/POC  SUBJECTIVE:  Alicia Adkins is a 27 y.o. [id number] GYN patientfemale here for a vaginal swab for STD screen.  She reports the following symptoms: none for 0 days. Denies abnormal vaginal bleeding, significant pelvic pain, fever, or UTI symptoms.  OBJECTIVE:  There were no vitals taken for this visit.  Appears well, in no apparent distress  ASSESSMENT: Vaginal swab for STD screen  PLAN: Self-collected vaginal probe for Gonorrhea, Chlamydia, Trichomonas, Bacterial Vaginosis, Yeast sent to lab Treatment: to be determined once results are received Follow-up as needed if symptoms persist/worsen, or new symptoms develop  Annamarie Dawley  03/31/2023 11:27 AM

## 2023-04-01 LAB — CERVICOVAGINAL ANCILLARY ONLY
Bacterial Vaginitis (gardnerella): POSITIVE — AB
Candida Glabrata: NEGATIVE
Candida Vaginitis: NEGATIVE
Chlamydia: POSITIVE — AB
Comment: NEGATIVE
Comment: NEGATIVE
Comment: NEGATIVE
Comment: NEGATIVE
Comment: NEGATIVE
Comment: NORMAL
Neisseria Gonorrhea: NEGATIVE
Trichomonas: NEGATIVE

## 2023-04-02 ENCOUNTER — Other Ambulatory Visit: Payer: Self-pay | Admitting: Adult Health

## 2023-04-02 MED ORDER — METRONIDAZOLE 500 MG PO TABS: 500.0000 mg | ORAL_TABLET | Freq: Two times a day (BID) | ORAL | 0 refills | Status: DC

## 2023-04-02 MED ORDER — DOXYCYCLINE HYCLATE 100 MG PO TABS: 100.0000 mg | ORAL_TABLET | Freq: Two times a day (BID) | ORAL | 0 refills | Status: DC

## 2023-04-02 NOTE — Progress Notes (Signed)
+  BV and chlamydia will rx flagyl and doxycycline

## 2023-04-28 ENCOUNTER — Encounter: Payer: Self-pay | Admitting: Adult Health

## 2023-04-28 ENCOUNTER — Other Ambulatory Visit (HOSPITAL_COMMUNITY)
Admission: RE | Admit: 2023-04-28 | Discharge: 2023-04-28 | Disposition: A | Discharge status: Home / Self Care | Payer: BC Managed Care – PPO | Source: Ambulatory Visit | Attending: Adult Health | Admitting: Adult Health

## 2023-04-28 ENCOUNTER — Ambulatory Visit (INDEPENDENT_AMBULATORY_CARE_PROVIDER_SITE_OTHER): Payer: BC Managed Care – PPO | Admitting: Adult Health

## 2023-04-28 VITALS — BP 128/79 | HR 78 | Ht 63.5 in | Wt 251.5 lb

## 2023-04-28 DIAGNOSIS — N898 Other specified noninflammatory disorders of vagina: Secondary | ICD-10-CM | POA: Diagnosis not present

## 2023-04-28 DIAGNOSIS — Z113 Encounter for screening for infections with a predominantly sexual mode of transmission: Secondary | ICD-10-CM | POA: Insufficient documentation

## 2023-04-28 DIAGNOSIS — Z09 Encounter for follow-up examination after completed treatment for conditions other than malignant neoplasm: Secondary | ICD-10-CM | POA: Insufficient documentation

## 2023-04-28 DIAGNOSIS — A749 Chlamydial infection, unspecified: Secondary | ICD-10-CM

## 2023-04-28 DIAGNOSIS — Z30011 Encounter for initial prescription of contraceptive pills: Secondary | ICD-10-CM

## 2023-04-28 DIAGNOSIS — Z01419 Encounter for gynecological examination (general) (routine) without abnormal findings: Secondary | ICD-10-CM

## 2023-04-28 MED ORDER — LO LOESTRIN FE 1 MG-10 MCG / 10 MCG PO TABS: 1.0000 | ORAL_TABLET | Freq: Every day | ORAL | 4 refills | Status: DC

## 2023-04-28 NOTE — Progress Notes (Signed)
Patient ID: Alicia Adkins, female   DOB: 1995/12/24, 27 y.o.   MRN: [id number] History of Present Illness: Alicia Adkins is a 27 year old black female, single, G2P2002, in for a well woman gyn exam and proof of cure for recent chlamydia and BV and discuss birth control options.  Last pap was negative HPV, NILM 09/12/21.   Current Medications, Allergies, Past Medical History, Past Surgical History, Family History and Social History were reviewed in [employer] record.     Review of Systems: Patient denies any headaches, hearing loss, fatigue, blurred vision, shortness of breath, chest pain, abdominal pain, problems with bowel movements, urination, or intercourse. No joint pain or mood swings.     Physical Exam:BP 128/79 (BP Location: Left Arm, Patient Position: Sitting, Cuff Size: Large)   Pulse 78   Ht 5' 3.5" (1.613 m)   Wt 251 lb 8 oz (114.1 kg)   LMP 04/22/2023   Breastfeeding No   BMI 43.85 kg/m   General:  Well developed, well nourished, no acute distress Skin:  Warm and dry Neck:  Midline trachea, normal thyroid, good ROM, no lymphadenopathy Lungs; Clear to auscultation bilaterally Breast:  No dominant palpable mass, retraction, or nipple discharge Cardiovascular: Regular rate and rhythm Abdomen:  Soft, non tender, no hepatosplenomegaly Pelvic:  External genitalia is normal in appearance, no lesions.  The vagina is has white discharge, no odor noted. Urethra has no lesions or masses. The cervix is bulbous.  Uterus is felt to be normal size, shape, and contour.  No adnexal masses or tenderness noted.Bladder is non tender, no masses felt.CV swab obtained.  Extremities/musculoskeletal:  No swelling or varicosities noted, no clubbing or cyanosis Psych:  No mood changes, alert and cooperative,seems happy AA is 2 Fall risk is low    04/28/2023   10:03 AM 03/26/2021    8:51 AM 12/17/2020    2:39 PM  Depression screen PHQ 2/9  Decreased Interest 0 0 0  Down,  Depressed, Hopeless 0 0 0  PHQ - 2 Score 0 0 0  Altered sleeping 1 1 1   Tired, decreased energy 1 1 1   Change in appetite 0 0 0  Feeling bad or failure about yourself  0 0 0  Trouble concentrating 0 0 0  Moving slowly or fidgety/restless 0 0 0  Suicidal thoughts 0 0 0  PHQ-9 Score 2 2 2        04/28/2023   10:03 AM 03/26/2021    8:51 AM 12/17/2020    2:40 PM  GAD 7 : Generalized Anxiety Score  Nervous, Anxious, on Edge 0 0 0  Control/stop worrying 0 0 0  Worry too much - different things 0 0 1  Trouble relaxing 0 0 1  Restless 0 0 0  Easily annoyed or irritable 1 0 1  Afraid - awful might happen 0 0 0  Total GAD 7 Score 1 0 3      Upstream - 04/28/23 1013       Pregnancy Intention Screening   Does the patient want to become pregnant in the next year? No    Does the patient's partner want to become pregnant in the next year? No    Would the patient like to discuss contraceptive options today? Yes      Contraception Wrap Up   Current Method Female Condom    End Method Oral Contraceptive    Contraception Counseling Provided Yes    How was the end contraceptive method provided? Prescription  Examination chaperoned by Malachy Mood LPN   Impression and Plan: 1. Encounter for well woman exam with routine gynecological exam Pap and physical in 1 year   2. Chlamydia CV swab sent  - Cervicovaginal ancillary only( [place])  3. Screening examination for STD (sexually transmitted disease) CV swab sent for GC/CHL,trich,BV and yeast  Will check HIV and RPR - Cervicovaginal ancillary only( [place]) - HIV Antibody (routine testing w rflx) - RPR  4. Encounter for initial prescription of contraceptive pills Denies MI,stroke,DVT,breast cancer or migraine with aura Discussed options and she wants to try the pill Will rx lo Loestrin can start today or with next period,  and use condoms for 1 pack  Meds ordered this encounter  Medications    Norethindrone-Ethinyl Estradiol-Fe Biphas (LO LOESTRIN FE) 1 MG-10 MCG / 10 MCG tablet    Sig: Take 1 tablet by mouth daily. Take 1 daily by mouth    Dispense:  84 tablet    Refill:  4    BIN [id number], PCN [id number], GRP [id number]    Order Specific Question:   Supervising Provider    Answer:   Duane Lope H [[id number]]     5. Follow-up treatment CV swab sent  - Cervicovaginal ancillary only( [place])  6. Vaginal discharge CV swab sent for GC/CHL,trich, BV and yeast  - Cervicovaginal ancillary only( [place])

## 2023-04-29 LAB — CERVICOVAGINAL ANCILLARY ONLY
Bacterial Vaginitis (gardnerella): NEGATIVE
Candida Glabrata: NEGATIVE
Candida Vaginitis: NEGATIVE
Chlamydia: NEGATIVE
Comment: NEGATIVE
Comment: NEGATIVE
Comment: NEGATIVE
Comment: NEGATIVE
Comment: NEGATIVE
Comment: NORMAL
Neisseria Gonorrhea: NEGATIVE
Trichomonas: NEGATIVE

## 2023-04-29 LAB — HIV ANTIBODY (ROUTINE TESTING W REFLEX): HIV Screen 4th Generation wRfx: NONREACTIVE

## 2023-04-29 LAB — RPR: RPR Ser Ql: NONREACTIVE

## 2023-07-29 ENCOUNTER — Ambulatory Visit: Payer: BC Managed Care – PPO | Admitting: Adult Health

## 2023-12-23 NOTE — L&D Delivery Note (Signed)
 OB/GYN Faculty Practice Delivery Note  Alicia Adkins is a 28 y.o. [id number] s/p SVD at [redacted]w[redacted]d. She was admitted for IOL for cHTN.   ROM: 6h 26m with clear fluid GBS Status:  Positive/-- (12/11 0440) Maximum Maternal Temperature:  Temp (24hrs), Avg:98.3 F (36.8 C), Min:98 F (36.7 C), Max:98.7 F (37.1 C)    Labor Progress: Patient arrived at 3.5 cm dilation and was induced with AROM and  pitocin .   Delivery Date/Time: 12/18/2024 at 11:01 Delivery: Called to room and patient was complete and pushing. Head delivered in direct OA position. No nuchal cord present. Shoulder and body delivered in usual fashion. Infant with spontaneous cry, placed on mother's abdomen, dried and stimulated. Cord clamped x 2 after 1-minute delay, and cut by cousin. Cord blood drawn. Placenta delivered spontaneously with gentle cord traction. Fundus firm with massage and Pitocin . Labia, perineum, vagina, and cervix inspected with no lacerations.   Placenta:  spontaneous, intact, 3 vessel cord  Complications: None Lacerations: None EBL: approximately 350 mL Analgesia: epidural    Infant: APGAR (1 MIN):   APGAR (5 MINS):   APGAR (10 MINS):    Weight: pending  Steffan Rover, MD Attending Family Medicine Physician, [facility] for [facility], [facility]  12/18/2024 11:22 AM

## 2024-01-15 ENCOUNTER — Other Ambulatory Visit: Payer: Medicaid Other

## 2024-01-19 ENCOUNTER — Ambulatory Visit: Payer: Medicaid Other

## 2024-01-19 ENCOUNTER — Other Ambulatory Visit (HOSPITAL_COMMUNITY)
Admission: RE | Admit: 2024-01-19 | Discharge: 2024-01-19 | Disposition: A | Discharge status: Home / Self Care | Payer: Medicaid Other | Source: Ambulatory Visit | Attending: Obstetrics & Gynecology | Admitting: Obstetrics & Gynecology

## 2024-01-19 ENCOUNTER — Encounter: Payer: Self-pay | Admitting: Obstetrics & Gynecology

## 2024-01-19 VITALS — BP 127/89 | HR 90 | Ht 63.5 in | Wt 237.0 lb

## 2024-01-19 DIAGNOSIS — Z30011 Encounter for initial prescription of contraceptive pills: Secondary | ICD-10-CM

## 2024-01-19 DIAGNOSIS — A609 Anogenital herpesviral infection, unspecified: Secondary | ICD-10-CM

## 2024-01-19 DIAGNOSIS — Z113 Encounter for screening for infections with a predominantly sexual mode of transmission: Secondary | ICD-10-CM | POA: Diagnosis not present

## 2024-01-19 MED ORDER — NORETHIN ACE-ETH ESTRAD-FE 1-20 MG-MCG(24) PO TABS: 1.0000 | ORAL_TABLET | Freq: Every day | ORAL | 4 refills | Status: DC

## 2024-01-19 MED ORDER — LIDOCAINE 5 % EX OINT: TOPICAL_OINTMENT | CUTANEOUS | 0 refills | Status: DC

## 2024-01-19 MED ORDER — VALACYCLOVIR HCL 500 MG PO TABS: 1000.0000 mg | ORAL_TABLET | Freq: Two times a day (BID) | ORAL | 0 refills | Status: AC

## 2024-01-19 NOTE — Progress Notes (Signed)
   GYN VISIT Patient name: Alicia Adkins MRN [id number]  Date of birth: 1996-02-14 Chief Complaint:   irritated when wiping after using the bathroom  History of Present Illness:   Alicia Adkins is a 28 y.o. G59P2002 female being seen today for the following concerns:  Initially RN visit for self-swab, but pt reporting the following: Near rectum feels irritated- started last week.  Feels like there may be a rash- not for sure.   Sexually active with same partner, but it has been some time.  No condoms Pt supposed to be on OCPs, but difficulty with insurance coverage  Denies vaignal discharge, odor or itching.  Denies pelvic or abdominal pain.  LMP: 1/14  Review of Systems:   Pertinent items are noted in HPI Denies fever/chills, dizziness, headaches, visual disturbances, fatigue, shortness of breath, chest pain, abdominal pain, vomiting, no problems with periods, bowel movements, urination, or intercourse unless otherwise stated above.  Pertinent History Reviewed:   Past Surgical History:  Procedure Laterality Date   WISDOM TOOTH EXTRACTION      Past Medical History:  Diagnosis Date   Chlamydia 02/19/2022   02/19/22 treated with doxycycline, no sex POC in 4 weeks    Depression    Hypertension    Trichimoniasis 08/06/2018   +trich on pap, treated with flagyl, POC 8/26___   Reviewed problem list, medications and allergies. Physical Assessment:   Vitals:   01/19/24 1112  BP: 127/89  Pulse: 90  Weight: 237 lb (107.5 kg)  Height: 5' 3.5" (1.613 m)  Body mass index is 41.32 kg/m.       Physical Examination:   General appearance: alert, well appearing, and in no distress  Psych: mood appropriate, normal affect  Skin: warm & dry   Cardiovascular: normal heart rate noted  Respiratory: normal respiratory effort, no distress  Abdomen: soft, non-tender   Pelvic: At perineum extending to rectum, multiple ulcers noted- painful to palpation.    Extremities: no edema    Chaperone: Faith Rogue    Assessment & Plan:  1) HSV -reviewed findings on exam that strongly suggest HSV -swab obtained and will complete STI screening -Rx sent in  -avoid IC until outbreak resolved.  Encouraged pt to speak with partner -briefly discussed primary vs recurrence.  Suspect primary outbreak []  f/u in 3-19mos  2) Contraceptie management -plan to send in alternative pill, if pt has difficulty obtaining the prescription, she is to call the office  Meds ordered this encounter  Medications   Norethindrone Acetate-Ethinyl Estrad-FE (LOESTRIN 24 FE) 1-20 MG-MCG(24) tablet    Sig: Take 1 tablet by mouth daily.    Dispense:  90 tablet    Refill:  4   valACYclovir (VALTREX) 500 MG tablet    Sig: Take 2 tablets (1,000 mg total) by mouth 2 (two) times daily for 7 days.    Dispense:  28 tablet    Refill:  0   lidocaine (XYLOCAINE) 5 % ointment    Sig: Apply to external affected area as needed    Dispense:  35 g    Refill:  0      Orders Placed This Encounter  Procedures   Herpes simplex virus culture    Return in about 3 months (around 04/18/2024) for follow up with Dr. Charlotta Newton.   Myna Hidalgo, DO Attending Obstetrician & Gynecologist, [facility] for [employer], [facility]

## 2024-01-19 NOTE — Addendum Note (Signed)
Addended by: Moss Mc on: 01/19/2024 12:34 PM   Modules accepted: Orders

## 2024-01-20 LAB — CERVICOVAGINAL ANCILLARY ONLY
Chlamydia: NEGATIVE
Comment: NEGATIVE
Comment: NEGATIVE
Comment: NORMAL
Neisseria Gonorrhea: NEGATIVE
Trichomonas: NEGATIVE

## 2024-01-21 ENCOUNTER — Encounter: Payer: Self-pay | Admitting: Obstetrics & Gynecology

## 2024-01-21 LAB — HERPES SIMPLEX VIRUS CULTURE

## 2024-01-22 ENCOUNTER — Other Ambulatory Visit: Payer: Self-pay | Admitting: Obstetrics & Gynecology

## 2024-01-22 DIAGNOSIS — A609 Anogenital herpesviral infection, unspecified: Secondary | ICD-10-CM

## 2024-01-22 NOTE — Progress Notes (Signed)
Pt returned phone call Reviewed path report that was negative however based on clinical picture strong concern for HSV Recommend lab work for further clarification  Myna Hidalgo, DO Attending Obstetrician & Gynecologist, [facility] for [employer], [facility]

## 2024-02-03 ENCOUNTER — Encounter: Payer: Self-pay | Admitting: Obstetrics & Gynecology

## 2024-06-27 ENCOUNTER — Encounter: Payer: Self-pay | Admitting: *Deleted

## 2024-06-27 ENCOUNTER — Ambulatory Visit: Admitting: *Deleted

## 2024-06-27 VITALS — BP 118/83 | HR 108 | Ht 63.0 in | Wt 230.0 lb

## 2024-06-27 DIAGNOSIS — Z3201 Encounter for pregnancy test, result positive: Secondary | ICD-10-CM

## 2024-06-27 DIAGNOSIS — O3680X0 Pregnancy with inconclusive fetal viability, not applicable or unspecified: Secondary | ICD-10-CM

## 2024-06-27 LAB — POCT URINE PREGNANCY: Preg Test, Ur: POSITIVE — AB

## 2024-06-27 MED ORDER — PRENATAL PLUS 27-1 MG PO TABS: ORAL_TABLET | ORAL | 3 refills | Status: AC

## 2024-06-27 MED ORDER — DOXYLAMINE-PYRIDOXINE 10-10 MG PO TBEC: DELAYED_RELEASE_TABLET | ORAL | 6 refills | Status: DC

## 2024-06-27 NOTE — Addendum Note (Signed)
 Addended by: ILEAN RUTHERFORD HERO on: 06/27/2024 03:08 PM   Modules accepted: Orders

## 2024-06-27 NOTE — Progress Notes (Signed)
   NURSE VISIT- PREGNANCY CONFIRMATION   SUBJECTIVE:  Alicia Adkins is a 28 y.o. G71P2002 female at [redacted]w[redacted]d by certain LMP of Patient's last menstrual period was 03/22/2024. Here for pregnancy confirmation.  Home pregnancy test: positive x 3.  She reports nausea and vomiting.  She is not taking prenatal vitamins.    OBJECTIVE:  BP 118/83 (BP Location: Right Arm, Patient Position: Sitting, Cuff Size: Large)   Pulse (!) 108   Ht 5' 3 (1.6 m)   Wt 230 lb (104.3 kg)   LMP 03/22/2024   BMI 40.74 kg/m   Appears well, in no apparent distress  Results for orders placed or performed in visit on 06/27/24 (from the past 24 hours)  POCT urine pregnancy   Collection Time: 06/27/24  3:00 PM  Result Value Ref Range   Preg Test, Ur Positive (A) Negative    ASSESSMENT: Positive pregnancy test, [redacted]w[redacted]d by LMP    PLAN: Schedule for dating ultrasound ASAP for dating US !!! Prenatal vitamins: note routed to [facility] to send prescription   Nausea medicines: requested-note routed to [facility] to send prescription   OB packet given: Yes  Clarita Salt  06/27/2024 3:05 PM

## 2024-06-27 NOTE — Addendum Note (Signed)
 Addended by: ILEAN RUTHERFORD HERO on: 06/27/2024 04:31 PM   Modules accepted: Orders

## 2024-06-27 NOTE — Addendum Note (Signed)
 Addended by: KIZZIE SUZEN SAUNDERS on: 06/27/2024 04:43 PM   Modules accepted: Orders

## 2024-06-28 ENCOUNTER — Other Ambulatory Visit: Payer: Self-pay | Admitting: Obstetrics & Gynecology

## 2024-06-28 DIAGNOSIS — O3680X0 Pregnancy with inconclusive fetal viability, not applicable or unspecified: Secondary | ICD-10-CM

## 2024-07-05 ENCOUNTER — Ambulatory Visit (HOSPITAL_COMMUNITY)
Admission: RE | Admit: 2024-07-05 | Discharge: 2024-07-05 | Disposition: A | Discharge status: Home / Self Care | Source: Ambulatory Visit | Attending: Obstetrics & Gynecology | Admitting: Obstetrics & Gynecology

## 2024-07-05 ENCOUNTER — Other Ambulatory Visit: Payer: Self-pay | Admitting: Obstetrics & Gynecology

## 2024-07-05 ENCOUNTER — Encounter (HOSPITAL_COMMUNITY): Payer: Self-pay

## 2024-07-05 DIAGNOSIS — O3680X0 Pregnancy with inconclusive fetal viability, not applicable or unspecified: Secondary | ICD-10-CM

## 2024-07-06 ENCOUNTER — Ambulatory Visit (HOSPITAL_COMMUNITY)
Admission: RE | Admit: 2024-07-06 | Discharge: 2024-07-06 | Disposition: A | Discharge status: Home / Self Care | Source: Ambulatory Visit | Attending: Obstetrics & Gynecology | Admitting: Obstetrics & Gynecology

## 2024-07-06 DIAGNOSIS — O3680X0 Pregnancy with inconclusive fetal viability, not applicable or unspecified: Secondary | ICD-10-CM | POA: Diagnosis not present

## 2024-07-06 DIAGNOSIS — Z3A15 15 weeks gestation of pregnancy: Secondary | ICD-10-CM | POA: Diagnosis not present

## 2024-07-06 DIAGNOSIS — Z3687 Encounter for antenatal screening for uncertain dates: Secondary | ICD-10-CM | POA: Diagnosis not present

## 2024-07-08 ENCOUNTER — Ambulatory Visit: Payer: Self-pay | Admitting: Obstetrics & Gynecology

## 2024-07-20 ENCOUNTER — Encounter: Payer: Self-pay | Admitting: *Deleted

## 2024-07-20 DIAGNOSIS — O0990 Supervision of high risk pregnancy, unspecified, unspecified trimester: Secondary | ICD-10-CM | POA: Insufficient documentation

## 2024-07-21 ENCOUNTER — Encounter: Payer: Self-pay | Admitting: Advanced Practice Midwife

## 2024-07-21 ENCOUNTER — Ambulatory Visit (INDEPENDENT_AMBULATORY_CARE_PROVIDER_SITE_OTHER): Admitting: Advanced Practice Midwife

## 2024-07-21 ENCOUNTER — Other Ambulatory Visit (HOSPITAL_COMMUNITY)
Admission: RE | Admit: 2024-07-21 | Discharge: 2024-07-21 | Disposition: A | Discharge status: Home / Self Care | Source: Ambulatory Visit | Attending: Advanced Practice Midwife | Admitting: Advanced Practice Midwife

## 2024-07-21 ENCOUNTER — Encounter: Admitting: *Deleted

## 2024-07-21 VITALS — BP 123/81 | HR 111 | Wt 233.0 lb

## 2024-07-21 DIAGNOSIS — Z6841 Body Mass Index (BMI) 40.0 and over, adult: Secondary | ICD-10-CM

## 2024-07-21 DIAGNOSIS — Z131 Encounter for screening for diabetes mellitus: Secondary | ICD-10-CM

## 2024-07-21 DIAGNOSIS — Z3A17 17 weeks gestation of pregnancy: Secondary | ICD-10-CM | POA: Diagnosis not present

## 2024-07-21 DIAGNOSIS — O99212 Obesity complicating pregnancy, second trimester: Secondary | ICD-10-CM

## 2024-07-21 DIAGNOSIS — O0992 Supervision of high risk pregnancy, unspecified, second trimester: Secondary | ICD-10-CM

## 2024-07-21 DIAGNOSIS — E6609 Other obesity due to excess calories: Secondary | ICD-10-CM | POA: Diagnosis not present

## 2024-07-21 DIAGNOSIS — O99210 Obesity complicating pregnancy, unspecified trimester: Secondary | ICD-10-CM | POA: Insufficient documentation

## 2024-07-21 MED ORDER — BLOOD PRESSURE MONITOR MISC: 0 refills | Status: DC

## 2024-07-21 MED ORDER — ONDANSETRON 4 MG PO TBDP: 4.0000 mg | ORAL_TABLET | Freq: Four times a day (QID) | ORAL | 3 refills | Status: DC

## 2024-07-21 MED ORDER — ASPIRIN 81 MG PO TBEC: 81.0000 mg | DELAYED_RELEASE_TABLET | Freq: Every day | ORAL | 6 refills | Status: DC

## 2024-07-21 MED ORDER — ASPIRIN 81 MG PO TBEC: 81.0000 mg | DELAYED_RELEASE_TABLET | Freq: Every day | ORAL | 12 refills | Status: DC

## 2024-07-21 NOTE — Progress Notes (Signed)
 INITIAL OBSTETRICAL VISIT Patient name: Alicia Adkins MRN [id number]  Date of birth: 1996-06-30 Chief Complaint:   Initial Prenatal Visit  History of Present Illness:   Alicia Adkins is a 28 y.o. G75P2002 African American female at [redacted]w[redacted]d by LMP c/w u/s at 15 weeks with an Estimated Date of Delivery: 12/27/24 being seen today for her initial obstetrical visit.   Her obstetrical history is significant for obesity and CHTN.   Today she reports some continued nausea; hasn't picked up diclegis  yet.  Also ordered zofran . .     07/21/2024    3:21 PM 04/28/2023   10:03 AM 03/26/2021    8:51 AM 12/17/2020    2:39 PM 05/04/2019    9:16 AM  Depression screen PHQ 2/9  Decreased Interest 0 0 0 0 2  Down, Depressed, Hopeless 0 0 0 0 2  PHQ - 2 Score 0 0 0 0 4  Altered sleeping 1 1 1 1 3   Tired, decreased energy 1 1 1 1 1   Change in appetite 1 0 0 0 1  Feeling bad or failure about yourself  1 0 0 0 0  Trouble concentrating 0 0 0 0 0  Moving slowly or fidgety/restless 0 0 0 0 0  Suicidal thoughts 0 0 0 0 0  PHQ-9 Score 4 2 2 2 9     Patient's last menstrual period was 03/22/2024. Last pap  Diagnosis  Date Value Ref Range Status  09/12/2021   Final   - Negative for intraepithelial lesion or malignancy (NILM)   Review of Systems:   Pertinent items are noted in HPI Denies cramping/contractions, leakage of fluid, vaginal bleeding, abnormal vaginal discharge w/ itching/odor/irritation, headaches, visual changes, shortness of breath, chest pain, abdominal pain, severe nausea/vomiting, or problems with urination or bowel movements unless otherwise stated above.  Pertinent History Reviewed:  Reviewed past medical,surgical, social, obstetrical and family history.  Reviewed problem list, medications and allergies. OB History  Gravida Para Term Preterm AB Living  3 2 2  0 0 2  SAB IAB Ectopic Multiple Live Births  0 0 0 0 2    # Outcome Date GA Lbr Len/2nd Weight Sex Type Anes PTL Lv  3  Current           2 Term 06/14/21 [redacted]w[redacted]d 02:56 / 00:17 7 lb 12 oz (3.515 kg) F Vag-Spont EPI  LIV  1 Term 11/02/19 [redacted]w[redacted]d 18:48 / 00:55 7 lb 3.3 oz (3.27 kg) M Vag-Vacuum EPI N LIV     Complications: Chronic hypertension   Physical Assessment:   Vitals:   07/21/24 1518  BP: 123/81  Pulse: (!) 111  Weight: 233 lb (105.7 kg)  Body mass index is 41.27 kg/m.       Physical Examination:  General appearance - well appearing, and in no distress  Mental status - alert, oriented to person, place, and time  Psych:  She has a normal mood and affect  Skin - warm and dry, normal color, no suspicious lesions noted  Chest - effort normal  Heart - normal rate and regular rhythm  Abdomen - soft, nontender  Extremities:  No swelling or varicosities noted   No results found for this or any previous visit (from the past 24 hours).   Indications for ASA therapy (per uptodate) One of the following: Previous pregnancy with preeclampsia, especially early onset and with an adverse outcome No Multifetal gestation No Chronic hypertension Yes T    07/21/2024    3:21  PM 04/28/2023   10:03 AM 03/26/2021    8:51 AM 12/17/2020    2:39 PM 05/04/2019    9:16 AM  Depression screen PHQ 2/9  Decreased Interest 0 0 0 0 2  Down, Depressed, Hopeless 0 0 0 0 2  PHQ - 2 Score 0 0 0 0 4  Altered sleeping 1 1 1 1 3   Tired, decreased energy 1 1 1 1 1   Change in appetite 1 0 0 0 1  Feeling bad or failure about yourself  1 0 0 0 0  Trouble concentrating 0 0 0 0 0  Moving slowly or fidgety/restless 0 0 0 0 0  Suicidal thoughts 0 0 0 0 0  PHQ-9 Score 4 2 2 2 9         07/21/2024    3:21 PM 04/28/2023   10:03 AM 03/26/2021    8:51 AM 12/17/2020    2:40 PM  GAD 7 : Generalized Anxiety Score  Nervous, Anxious, on Edge 0 0 0 0  Control/stop worrying 0 0 0 0  Worry too much - different things 1 0 0 1  Trouble relaxing 1 0 0 1  Restless 0 0 0 0  Easily annoyed or irritable 1 1 0 1  Afraid - awful might happen 0 0 0 0   Total GAD 7 Score 3 1 0 3    FHR:  148  Assessment & Plan:  1) High-Risk Pregnancy G3P2002 at [redacted]w[redacted]d with an Estimated Date of Delivery: 12/27/24   2) Initial OB visit    1. Supervision of high risk pregnancy in second trimester (Primary)  - AFP, Serum, Open Spina Bifida - Urine Culture - PANORAMA PRENATAL TEST - Hemoglobin A1c - Protein / creatinine ratio, urine - Comprehensive metabolic panel with GFR - CBC/D/Plt+RPR+Rh+ABO+RubIgG... - CHL AMB BABYSCRIPTS SCHEDULE OPTIMIZATION - Blood Pressure Monitor MISC; For regular home bp monitoring during pregnancy  Dispense: 1 each; Refill: 0 - Cervicovaginal ancillary only  2. [redacted] weeks gestation of pregnancy   3. Diabetes mellitus screening  - Hemoglobin A1c  4. CHTN, no meds  US  q 4 weeks; deliver 38-39.6 weeks  5. BMI 40.0-44.9, adult (HCC)US  q 4 weeks, testing at 34 weeks    CHTN: no meds.  Asa 81 mg daily, testing per obesity, deliver    Meds:  Meds ordered this encounter  Medications   Blood Pressure Monitor MISC    Sig: For regular home bp monitoring during pregnancy    Dispense:  1 each    Refill:  0    O09.91 Please mail to patient   aspirin  EC 81 MG tablet    Sig: Take 1 tablet (81 mg total) by mouth daily.    Dispense:  30 tablet    Refill:  6    Supervising Provider:   JAYNE VONN DEL [[id number]]    Initial labs obtained Continue prenatal vitamins Reviewed n/v relief measures and warning s/s to report Reviewed recommended weight gain based on pre-gravid BMI Encouraged well-balanced diet Genetic & carrier screening discussed: requests Panorama and AFP, declines NT/IT Ultrasound discussed; fetal survey: requested CCNC completed> form faxed if has or is planning to apply for medicaid The nature of [facility] [employer] with multiple MDs and other Advanced Practice Providers was explained to patient; also emphasized that fellows, residents, and students are part of our team. Doesn't have  a home bp cuff. Rx faxed. Check bp weekly, let us  know if >140/90.  Cathlean Cresenzo-Dishmon 4:01 PM

## 2024-07-21 NOTE — Patient Instructions (Addendum)
 [name], I greatly value your feedback.  If you receive a survey following your visit with us  today, we appreciate you taking the time to fill it out.  Thanks, Sherrell Ely, DNP, CNM  [facility] HAS MOVED!!! It is now [facility] ([address] [place], [place] [address]) Entrance located off of [address] Free 24/7 valet parking   Nausea & Vomiting Have saltine crackers or pretzels by your bed and eat a few bites before you raise your head out of bed in the morning Eat small frequent meals throughout the day instead of large meals Drink plenty of fluids throughout the day to stay hydrated, just don't drink a lot of fluids with your meals.  This can make your stomach fill up faster making you feel sick Do not brush your teeth right after you eat Products with real ginger are good for nausea, like ginger ale and ginger hard candy Make sure it says made with real ginger! Sucking on sour candy like lemon heads is also good for nausea If your prenatal vitamins make you nauseated, take them at night so you will sleep through the nausea Sea Bands If you feel like you need medicine for the nausea & vomiting please let us  know If you are unable to keep any fluids or food down please let us  know   Constipation Drink plenty of fluid, preferably water, throughout the day Eat foods high in fiber such as fruits, vegetables, and grains Exercise, such as walking, is a good way to keep your bowels regular Drink warm fluids, especially warm prune juice, or decaf coffee Eat a 1/2 cup of real oatmeal (not instant), 1/2 cup applesauce, and 1/2-1 cup warm prune juice every day If needed, you may take Colace (docusate sodium ) stool softener once or twice a day to help keep the stool soft.  If you still are having problems with constipation, you may take Miralax once daily as needed to help keep your bowels regular.   Home Blood Pressure Monitoring  for Patients   Your provider has recommended that you check your blood pressure (BP) at least once a week at home. If you do not have a blood pressure cuff at home, one will be provided for you. Contact your provider if you have not received your monitor within 1 week.   Helpful Tips for Accurate Home Blood Pressure Checks  Don't smoke, exercise, or drink caffeine 30 minutes before checking your BP Use the restroom before checking your BP (a full bladder can raise your pressure) Relax in a comfortable upright chair Feet on the ground Left arm resting comfortably on a flat surface at the level of your heart Legs uncrossed Back supported Sit quietly and don't talk Place the cuff on your bare arm Adjust snuggly, so that only two fingertips can fit between your skin and the top of the cuff Check 2 readings separated by at least one minute Keep a log of your BP readings For a visual, please reference this diagram: http://ccnc.care/bpdiagram  Provider Name: [facility]     Phone: [phone]  Zone 1: ALL CLEAR  Continue to monitor your symptoms:  BP reading is less than 140 (top number) or less than 90 (bottom number)  No right upper stomach pain No headaches or seeing spots No feeling nauseated or throwing up No swelling in face and hands  Zone 2: CAUTION Call your doctor's office for any of the following:  BP reading is greater than 140 (top number) or greater than 90 (bottom number)  Stomach pain under your ribs in the middle or right side Headaches or seeing spots Feeling nauseated or throwing up Swelling in face and hands  Zone 3: EMERGENCY  Seek immediate medical care if you have any of the following:  BP reading is greater than160 (top number) or greater than 110 (bottom number) Severe headaches not improving with Tylenol  Serious difficulty catching your breath Any worsening symptoms from Zone 2    First Trimester of Pregnancy The first trimester of pregnancy is  from week 1 until the end of week 12 (months 1 through 3). A week after a sperm fertilizes an egg, the egg will implant on the wall of the uterus. This embryo will begin to develop into a baby. Genes from you and your partner are forming the baby. The female genes determine whether the baby is a boy or a girl. At 6-8 weeks, the eyes and face are formed, and the heartbeat can be seen on ultrasound. At the end of 12 weeks, all the baby's organs are formed.  Now that you are pregnant, you will want to do everything you can to have a healthy baby. Two of the most important things are to get good prenatal care and to follow your health care provider's instructions. Prenatal care is all the medical care you receive before the baby's birth. This care will help prevent, find, and treat any problems during the pregnancy and childbirth. BODY CHANGES Your body goes through many changes during pregnancy. The changes vary from woman to woman.  You may gain or lose a couple of pounds at first. You may feel sick to your stomach (nauseous) and throw up (vomit). If the vomiting is uncontrollable, call your health care provider. You may tire easily. You may develop headaches that can be relieved by medicines approved by your health care provider. You may urinate more often. Painful urination may mean you have a bladder infection. You may develop heartburn as a result of your pregnancy. You may develop constipation because certain hormones are causing the muscles that push waste through your intestines to slow down. You may develop hemorrhoids or swollen, bulging veins (varicose veins). Your breasts may begin to grow larger and become tender. Your nipples may stick out more, and the tissue that surrounds them (areola) may become darker. Your gums may bleed and may be sensitive to brushing and flossing. Dark spots or blotches (chloasma, mask of pregnancy) may develop on your face. This will likely fade after the baby is  born. Your menstrual periods will stop. You may have a loss of appetite. You may develop cravings for certain kinds of food. You may have changes in your emotions from day to day, such as being excited to be pregnant or being concerned that something may go wrong with the pregnancy and baby. You may have more vivid and strange dreams. You may have changes in your hair. These can include thickening of your hair, rapid growth, and changes in texture. Some women also have hair loss during or after pregnancy, or hair that feels dry or thin. Your hair will most likely return to normal after your baby is born. WHAT TO EXPECT AT YOUR PRENATAL VISITS During a routine prenatal visit: You will be weighed to make sure you and the baby are growing normally. Your blood pressure will be taken. Your abdomen will be measured to track your baby's growth. The fetal  heartbeat will be listened to starting around week 10 or 12 of your pregnancy. Test results from any previous visits will be discussed. Your health care provider may ask you: How you are feeling. If you are feeling the baby move. If you have had any abnormal symptoms, such as leaking fluid, bleeding, severe headaches, or abdominal cramping. If you have any questions. Other tests that may be performed during your first trimester include: Blood tests to find your blood type and to check for the presence of any previous infections. They will also be used to check for low iron levels (anemia) and Rh antibodies. Later in the pregnancy, blood tests for diabetes will be done along with other tests if problems develop. Urine tests to check for infections, diabetes, or protein in the urine. An ultrasound to confirm the proper growth and development of the baby. An amniocentesis to check for possible genetic problems. Fetal screens for spina bifida and Down syndrome. You may need other tests to make sure you and the baby are doing well. HOME CARE  INSTRUCTIONS  Medicines Follow your health care provider's instructions regarding medicine use. Specific medicines may be either safe or unsafe to take during pregnancy. Take your prenatal vitamins as directed. If you develop constipation, try taking a stool softener if your health care provider approves. Diet Eat regular, well-balanced meals. Choose a variety of foods, such as meat or vegetable-based protein, fish, milk and low-fat dairy products, vegetables, fruits, and whole grain breads and cereals. Your health care provider will help you determine the amount of weight gain that is right for you. Avoid raw meat and uncooked cheese. These carry germs that can cause birth defects in the baby. Eating four or five small meals rather than three large meals a day may help relieve nausea and vomiting. If you start to feel nauseous, eating a few soda crackers can be helpful. Drinking liquids between meals instead of during meals also seems to help nausea and vomiting. If you develop constipation, eat more high-fiber foods, such as fresh vegetables or fruit and whole grains. Drink enough fluids to keep your urine clear or pale yellow. Activity and Exercise Exercise only as directed by your health care provider. Exercising will help you: Control your weight. Stay in shape. Be prepared for labor and delivery. Experiencing pain or cramping in the lower abdomen or low back is a good sign that you should stop exercising. Check with your health care provider before continuing normal exercises. Try to avoid standing for long periods of time. Move your legs often if you must stand in one place for a long time. Avoid heavy lifting. Wear low-heeled shoes, and practice good posture. You may continue to have sex unless your health care provider directs you otherwise. Relief of Pain or Discomfort Wear a good support bra for breast tenderness.   Take warm sitz baths to soothe any pain or discomfort caused by  hemorrhoids. Use hemorrhoid cream if your health care provider approves.   Rest with your legs elevated if you have leg cramps or low back pain. If you develop varicose veins in your legs, wear support hose. Elevate your feet for 15 minutes, 3-4 times a day. Limit salt in your diet. Prenatal Care Schedule your prenatal visits by the twelfth week of pregnancy. They are usually scheduled monthly at first, then more often in the last 2 months before delivery. Write down your questions. Take them to your prenatal visits. Keep all your prenatal visits as directed  by your health care provider. Safety Wear your seat belt at all times when driving. Make a list of emergency phone numbers, including numbers for family, friends, the hospital, and police and fire departments. General Tips Ask your health care provider for a referral to a local prenatal education class. Begin classes no later than at the beginning of month 6 of your pregnancy. Ask for help if you have counseling or nutritional needs during pregnancy. Your health care provider can offer advice or refer you to specialists for help with various needs. Do not use hot tubs, steam rooms, or saunas. Do not douche or use tampons or scented sanitary pads. Do not cross your legs for long periods of time. Avoid cat litter boxes and soil used by cats. These carry germs that can cause birth defects in the baby and possibly loss of the fetus by miscarriage or stillbirth. Avoid all smoking, herbs, alcohol, and medicines not prescribed by your health care provider. Chemicals in these affect the formation and growth of the baby. Schedule a dentist appointment. At home, brush your teeth with a soft toothbrush and be gentle when you floss. SEEK MEDICAL CARE IF:  You have dizziness. You have mild pelvic cramps, pelvic pressure, or nagging pain in the abdominal area. You have persistent nausea, vomiting, or diarrhea. You have a bad smelling vaginal  discharge. You have pain with urination. You notice increased swelling in your face, hands, legs, or ankles. SEEK IMMEDIATE MEDICAL CARE IF:  You have a fever. You are leaking fluid from your vagina. You have spotting or bleeding from your vagina. You have severe abdominal cramping or pain. You have rapid weight gain or loss. You vomit blood or material that looks like coffee grounds. You are exposed to [place] measles and have never had them. You are exposed to fifth disease or chickenpox. You develop a severe headache. You have shortness of breath. You have any kind of trauma, such as from a fall or a car accident. Document Released: 12/02/2001 Document Revised: 04/24/2014 Document Reviewed: 10/18/2013 [facility] Patient Information 2015 [place], [place]. This information is not intended to replace advice given to you by your health care provider. Make sure you discuss any questions you have with your health care provider.  ADDITIONAL HEALTHCARE OPTIONS FOR PATIENTS  [facility] / e-Visit: [email or web address]         [facility]: [phone]  [facility]: [phone]                   [facility]: [phone]     Safe Medications in Pregnancy   Acne: Benzoyl Peroxide Salicylic Acid  Backache/Headache: Tylenol : 2 regular strength every 4 hours OR              2 Extra strength every 6 hours  Colds/Coughs/Allergies: Benadryl  (alcohol free) 25 mg every 6 hours as needed Breath right strips Claritin  Cepacol throat lozenges Chloraseptic throat spray Cold-Eeze- up to three times per day Cough drops, alcohol free Flonase  (by prescription only) Guaifenesin Mucinex Robitussin DM (plain only, alcohol free) Saline nasal spray/drops Sudafed (pseudoephedrine ) & Actifed ** use only after [redacted] weeks gestation and if you do not have high blood pressure Tylenol  Vicks Vaporub Zinc  lozenges Zyrtec   Constipation: Colace Ducolax suppositories Fleet enema Glycerin  suppositories Metamucil Milk of magnesia Miralax Senokot Smooth move tea  Diarrhea: Kaopectate Imodium A-D  *NO pepto Bismol  Hemorrhoids: Anusol Anusol HC Preparation H Tucks  Indigestion: Tums Maalox  Mylanta Zantac  Pepcid  Insomnia: Benadryl  (alcohol free) 25mg  every 6 hours as needed Tylenol  PM Unisom , no Gelcaps  Leg Cramps: Tums MagGel  Nausea/Vomiting:  Bonine Dramamine Emetrol Ginger extract Sea bands Meclizine  Nausea medication to take during pregnancy:  Unisom  (doxylamine  succinate 25 mg tablets) Take one tablet daily at bedtime. If symptoms are not adequately controlled, the dose can be increased to a maximum recommended dose of two tablets daily (1/2 tablet in the morning, 1/2 tablet mid-afternoon and one at bedtime). Vitamin B6 100mg  tablets. Take one tablet twice a day (up to 200 mg per day).  Skin Rashes: Aveeno products Benadryl  cream or 25mg  every 6 hours as needed Calamine Lotion 1% cortisone cream  Yeast infection: Gyne-lotrimin 7 Monistat 7   **If taking multiple medications, please check labels to avoid duplicating the same active ingredients **take medication as directed on the label ** Do not exceed 4000 mg of tylenol  in 24 hours **Do not take medications that contain aspirin  or ibuprofen    [phone] is the phone number for Pregnancy Classes or hospital tours at [facility].   You will be referred to  [email or web address]   for more information on childbirth classes   At this site you may register for classes. You may sign up for a waiting list if classes are full. Please SIGN UP FOR THIS!.   When the waiting list becomes long, sometimes new classes can be added.  [facility] Call to Register: [phone] or  [phone]   or   Register Online: [email or web address] THESE CLASSES FILL UP VERY QUICKLY, SO SIGN UP AS SOON AS YOU CAN!!! Please visit [facility] pregnancy website at [email or web address]  Childbirth Classes  Option 1: Birth & Baby Series Series of 3 weekly classes, on the same day of the week (can choose Mon-Thurs) from 6-9pm Helps you and your support person prepare for childbirth Reviews newborn care, labor & birth, cesarean birth, pain management, and comfort techniques Cost: $60 per couple for insured or self-pay, $30 per couple for Medicaid  Option 2: Weekend Birth & Baby This class is a weekend version of our Birth & Baby series.  It is designed for parents who have a difficult time fitting several weeks of classes into their schedule.   Covers the care of your newborn and the basics of labor and childbirth Friday 6:30pm-8:30pm Saturday 9am-4pm, includes lunch for you and your partner  Cost: $75 per couple for insured or self-pay, $30 per couple for Medicaid  Option 3: Natural Childbirth This series of 5 weekly classes is for expectant parents who want to learn and practice natural methods of coping with the process of labor and childbirth.  Can choose Mon or Tues, 7-9pm.   Covers relaxation, breathing, massage, visualization, role of the partner, and helpful positioning Participants learn how to be confident in their body's ability to give birth. Class empowers and helps parents make informed decisions about care. Includes discussion that will help new parents transition into the immediate postpartum period.  Cost: $75 per couple for insured or self-pay, $30 per couple for Medicaid  Option 4: Online Birth & Baby This online class offers you the freedom to complete a Birth & Baby series in the comfort of your own home.  The flexibility of this option allows you to review sections at your own pace, at times convenient to you and your support people.  It includes additional  video information, animations, quizzes and extended activities. Get  organized with helpful eClass tools, checklists, and trackers.  Cost: $60 for 60 days of online access                                                                            Other Available Classes  Baby & Me Enjoy this time to discuss newborn & infant parenting topics and family adjustment issues with other new mothers in a relaxed environment. Each week brings a new speaker or baby-centered activity. We encourage mothers and their babies (birth to crawling) to join us . You are welcome to visit this group even if you haven't delivered yet! It's wonderful to make new friends early and watch other moms interact with their babies. No registration or fee.  Big Brother/Big Sister Let your children share in the joy of a new brother or sister in this special class designed just for them. Discussion includes how families care for babies: swaddling, holding, diapering, safety, as well as how they can be helpful in their new role. This class is designed for children ages 2 to 23, but any age is welcome. Please register each child individually. $5 Breastfeeding Support Group This group is a mother-to-mother support circle where moms have the opportunity to share their breastfeeding experiences. A Breastfeeding Support nurse is present for questions and concerns. An infant scale is available for weight checks. No fee or registration.  Breastfeeding Your Baby Breastfeeding is a special time for mother and child. This class will help you feel ready to begin this important relationship. Your partner is encouraged to attend with you. Learn what to expect and feel more confident in the first days of breastfeeding your newborn. This class also addresses the most common fears and challenges of breastfeeding during the first few weeks, months, and beyond. $30 per couple Caring for Baby This class is for expectant and adoptive parents who want to  learn and practice the most up-to-date newborn care for their babies. Focus is on birth through first six weeks of life. Topics include feeding, bathing, diapering, crying, umbilical cord care, circumcision care and safe sleep. Parents learn how to recognize symptoms of illness and when to call the pediatrician. Register only the mom-to-be and your partner can plan to come with you. (*Note: This class is included in the Birth & Baby series and the Weekend Birth & Baby classes.) $10 per couple Comfort Techniques & Tour This 2-hour interactive class is designed for those who either do not wish to take the Birth & Baby series or for those who prefer our online childbirth class, but don't want to miss the opportunity to learn and practice hands-on techniques. These skills can help relieve some of the discomfort of labor and encourage your baby to rotate toward the best position for birth. You and your partner will be able to try a variety of labor positions with birth balls and rebozos as well as practice breathing, relaxation, and visual techniques. $20 per couple [employer] [facility] This course offers Dads-to-be the tools and knowledge needed to feel confident on their journey to becoming new fathers. Experienced dads, who have been trained as coaches, teach dads-to-be how to hold, comfort, diapers, swaddle and play with their infant while being  able to support the new mom as well. $25 Grandparent Love Expecting a grandbaby? Learn about the latest infant care and safety recommendations and ways to support your own child as he or she transitions into the parenting role. $10 per person Infant and Child CPR Parents, grandparents, babysitters, and friends learn Cardio-Pulmonary Resuscitation skills for infants and children. You will also learn how to treat both conscious and unconscious choking infants and children. Register each participant individually. (Note: This Family & Friends program does not offer  certification.) $20 per person Marvelous Multiples Expecting twins, triplets, or more? This free 2-hour class covers the differences in labor, birth, parenting, and breastfeeding issues that face multiples' parents.  Maternity Care Center Virtual Tour  Online virtual tour of the new [place] [facility] at [facility] [place] Talk This free mom-led group offers support and connection to mothers as they journey through the adjustments and struggles of that sometimes overwhelming first year after the birth of a child. A member of our staff will be present to share resources and additional support if needed, as you care for yourself and baby. You are welcome to visit this group before you deliver! It's wonderful to meet new friends early and watch other moms interact with their babies.  Waterbirth Class Interested in a waterbirth? This free informational class will help you discover whether waterbirth is the right fit for you and is required if you are planning a waterbirth. Education about waterbirth itself, supplies you may need, and what you may need from your support team is included in this class. Partners are encouraged to come.  Safe Medications in Pregnancy   Acne: Benzoyl Peroxide Salicylic Acid  Backache/Headache: Tylenol : 2 regular strength every 4 hours OR              2 Extra strength every 6 hours  Colds/Coughs/Allergies: Benadryl  (alcohol free) 25 mg every 6 hours as needed Breath right strips Claritin  Cepacol throat lozenges Chloraseptic throat spray Cold-Eeze- up to three times per day Cough drops, alcohol free Flonase  (by prescription only) Guaifenesin Mucinex Robitussin DM (plain only, alcohol free) Saline nasal spray/drops Sudafed (pseudoephedrine ) & Actifed ** use only after [redacted] weeks gestation and if you do not have high blood pressure Tylenol  Vicks Vaporub Zinc lozenges Zyrtec   Constipation: Colace Ducolax suppositories Fleet  enema Glycerin  suppositories Metamucil Milk of magnesia Miralax Senokot Smooth move tea  Diarrhea: Kaopectate Imodium A-D  *NO pepto Bismol  Hemorrhoids: Anusol Anusol HC Preparation H Tucks  Indigestion: Tums Maalox Mylanta Zantac  Pepcid  Insomnia: Benadryl  (alcohol free) 25mg  every 6 hours as needed Tylenol  PM Unisom , no Gelcaps  Leg Cramps: Tums MagGel  Nausea/Vomiting:  Bonine Dramamine Emetrol Ginger extract Sea bands Meclizine  Nausea medication to take during pregnancy:  Unisom  (doxylamine  succinate 25 mg tablets) Take one tablet daily at bedtime. If symptoms are not adequately controlled, the dose can be increased to a maximum recommended dose of two tablets daily (1/2 tablet in the morning, 1/2 tablet mid-afternoon and one at bedtime). Vitamin B6 100mg  tablets. Take one tablet twice a day (up to 200 mg per day).  Skin Rashes: Aveeno products Benadryl  cream or 25mg  every 6 hours as needed Calamine Lotion 1% cortisone cream  Yeast infection: Gyne-lotrimin 7 Monistat 7   **If taking multiple medications, please check labels to avoid duplicating the same active ingredients **take medication as directed on the label ** Do not exceed 4000 mg of tylenol  in 24 hours **Do  not take medications that contain aspirin  or ibuprofen 

## 2024-07-23 LAB — COMPREHENSIVE METABOLIC PANEL WITH GFR
ALT: 11 IU/L (ref 0–32)
AST: 14 IU/L (ref 0–40)
Albumin: 3.6 g/dL — ABNORMAL LOW (ref 4.0–5.0)
Alkaline Phosphatase: 58 IU/L (ref 44–121)
BUN/Creatinine Ratio: 18 (ref 9–23)
BUN: 9 mg/dL (ref 6–20)
Bilirubin Total: 0.2 mg/dL (ref 0.0–1.2)
CO2: 16 mmol/L — ABNORMAL LOW (ref 20–29)
Calcium: 9.7 mg/dL (ref 8.7–10.2)
Chloride: 100 mmol/L (ref 96–106)
Creatinine, Ser: 0.5 mg/dL — ABNORMAL LOW (ref 0.57–1.00)
Globulin, Total: 3.6 g/dL (ref 1.5–4.5)
Glucose: 94 mg/dL (ref 70–99)
Potassium: 4.2 mmol/L (ref 3.5–5.2)
Sodium: 135 mmol/L (ref 134–144)
Total Protein: 7.2 g/dL (ref 6.0–8.5)
eGFR: 131 mL/min/1.73 (ref >59)

## 2024-07-23 LAB — HEMOGLOBIN A1C
Est. average glucose Bld gHb Est-mCnc: 103 mg/dL
Hgb A1c MFr Bld: 5.2 % (ref 4.8–5.6)

## 2024-07-23 LAB — CBC/D/PLT+RPR+RH+ABO+RUBIGG...
Antibody Screen: NEGATIVE
Basophils Absolute: 0 x10E3/uL (ref 0.0–0.2)
Basos: 0 %
EOS (ABSOLUTE): 0.1 x10E3/uL (ref 0.0–0.4)
Eos: 1 %
HCV Ab: NONREACTIVE
HIV Screen 4th Generation wRfx: NONREACTIVE
Hematocrit: 33.3 % — ABNORMAL LOW (ref 34.0–46.6)
Hemoglobin: 11.1 g/dL (ref 11.1–15.9)
Hepatitis B Surface Ag: NEGATIVE
Immature Grans (Abs): 0 x10E3/uL (ref 0.0–0.1)
Immature Granulocytes: 0 %
Lymphocytes Absolute: 2 x10E3/uL (ref 0.7–3.1)
Lymphs: 16 %
MCH: 29.9 pg (ref 26.6–33.0)
MCHC: 33.3 g/dL (ref 31.5–35.7)
MCV: 90 fL (ref 79–97)
Monocytes Absolute: 0.6 x10E3/uL (ref 0.1–0.9)
Monocytes: 5 %
Neutrophils Absolute: 9.5 x10E3/uL — ABNORMAL HIGH (ref 1.4–7.0)
Neutrophils: 78 %
Platelets: 371 x10E3/uL (ref 150–450)
RBC: 3.71 x10E6/uL — ABNORMAL LOW (ref 3.77–5.28)
RDW: 13.3 % (ref 11.7–15.4)
RPR Ser Ql: REACTIVE — AB
Rh Factor: POSITIVE
Rubella Antibodies, IGG: 4.71 {index} (ref >0.99)
WBC: 12.2 x10E3/uL — ABNORMAL HIGH (ref 3.4–10.8)

## 2024-07-23 LAB — AFP, SERUM, OPEN SPINA BIFIDA
AFP MoM: 1.1
AFP Value: 35.9 ng/mL
Gest. Age on Collection Date: 17.2 wk
Maternal Age At EDD: 28.7 a
OSBR Risk 1 IN: 10000
Test Results:: NEGATIVE
Weight: 233 [lb_av]

## 2024-07-23 LAB — HCV INTERPRETATION

## 2024-07-23 LAB — PROTEIN / CREATININE RATIO, URINE
Creatinine, Urine: 207.9 mg/dL
Protein, Ur: 38.2 mg/dL
Protein/Creat Ratio: 184 mg/g{creat} (ref 0–200)

## 2024-07-23 LAB — RPR, QUANT+TP ABS (REFLEX)
Rapid Plasma Reagin, Quant: 1:16 {titer} — ABNORMAL HIGH
T Pallidum Abs: REACTIVE — AB

## 2024-07-25 ENCOUNTER — Encounter: Payer: Self-pay | Admitting: Advanced Practice Midwife

## 2024-07-25 ENCOUNTER — Other Ambulatory Visit: Payer: Self-pay

## 2024-07-25 ENCOUNTER — Ambulatory Visit: Payer: Self-pay | Admitting: Advanced Practice Midwife

## 2024-07-25 DIAGNOSIS — A539 Syphilis, unspecified: Secondary | ICD-10-CM | POA: Insufficient documentation

## 2024-07-25 DIAGNOSIS — R8271 Bacteriuria: Secondary | ICD-10-CM | POA: Insufficient documentation

## 2024-07-25 DIAGNOSIS — N3000 Acute cystitis without hematuria: Secondary | ICD-10-CM

## 2024-07-25 LAB — CERVICOVAGINAL ANCILLARY ONLY
Chlamydia: NEGATIVE
Comment: NEGATIVE
Comment: NORMAL
Neisseria Gonorrhea: NEGATIVE

## 2024-07-25 LAB — URINE CULTURE

## 2024-07-25 MED ORDER — CEFADROXIL 500 MG PO CAPS: 500.0000 mg | ORAL_CAPSULE | Freq: Two times a day (BID) | ORAL | 0 refills | Status: DC

## 2024-07-26 ENCOUNTER — Encounter: Payer: Self-pay | Admitting: *Deleted

## 2024-07-29 LAB — PANORAMA PRENATAL TEST FULL PANEL:PANORAMA TEST PLUS 5 ADDITIONAL MICRODELETIONS: FETAL FRACTION: 7.7

## 2024-08-03 ENCOUNTER — Other Ambulatory Visit: Payer: Self-pay | Admitting: Adult Health

## 2024-08-04 ENCOUNTER — Telehealth: Payer: Self-pay

## 2024-08-04 DIAGNOSIS — A539 Syphilis, unspecified: Secondary | ICD-10-CM | POA: Diagnosis not present

## 2024-08-04 NOTE — Telephone Encounter (Signed)
 Spoke wit Tiajuana from health department regarding patient treatment for syphilis. Gave information she needs for patient to treat patient today at the Health department.

## 2024-08-05 ENCOUNTER — Other Ambulatory Visit: Payer: Self-pay | Admitting: Advanced Practice Midwife

## 2024-08-05 DIAGNOSIS — Z363 Encounter for antenatal screening for malformations: Secondary | ICD-10-CM

## 2024-08-05 DIAGNOSIS — Z6841 Body Mass Index (BMI) 40.0 and over, adult: Secondary | ICD-10-CM

## 2024-08-05 DIAGNOSIS — O0992 Supervision of high risk pregnancy, unspecified, second trimester: Secondary | ICD-10-CM

## 2024-08-05 DIAGNOSIS — E6609 Other obesity due to excess calories: Secondary | ICD-10-CM

## 2024-08-05 DIAGNOSIS — Z3A17 17 weeks gestation of pregnancy: Secondary | ICD-10-CM

## 2024-08-05 DIAGNOSIS — Z131 Encounter for screening for diabetes mellitus: Secondary | ICD-10-CM

## 2024-08-08 ENCOUNTER — Ambulatory Visit (INDEPENDENT_AMBULATORY_CARE_PROVIDER_SITE_OTHER)

## 2024-08-08 ENCOUNTER — Encounter: Payer: Self-pay | Admitting: Obstetrics & Gynecology

## 2024-08-08 ENCOUNTER — Ambulatory Visit (INDEPENDENT_AMBULATORY_CARE_PROVIDER_SITE_OTHER): Admitting: Obstetrics & Gynecology

## 2024-08-08 VITALS — BP 130/80 | HR 106 | Wt 228.4 lb

## 2024-08-08 DIAGNOSIS — Z6841 Body Mass Index (BMI) 40.0 and over, adult: Secondary | ICD-10-CM | POA: Diagnosis not present

## 2024-08-08 DIAGNOSIS — Z3A19 19 weeks gestation of pregnancy: Secondary | ICD-10-CM

## 2024-08-08 DIAGNOSIS — O10912 Unspecified pre-existing hypertension complicating pregnancy, second trimester: Secondary | ICD-10-CM | POA: Diagnosis not present

## 2024-08-08 DIAGNOSIS — O98119 Syphilis complicating pregnancy, unspecified trimester: Secondary | ICD-10-CM

## 2024-08-08 DIAGNOSIS — O10919 Unspecified pre-existing hypertension complicating pregnancy, unspecified trimester: Secondary | ICD-10-CM

## 2024-08-08 DIAGNOSIS — O0992 Supervision of high risk pregnancy, unspecified, second trimester: Secondary | ICD-10-CM

## 2024-08-08 DIAGNOSIS — Z363 Encounter for antenatal screening for malformations: Secondary | ICD-10-CM

## 2024-08-08 DIAGNOSIS — O98112 Syphilis complicating pregnancy, second trimester: Secondary | ICD-10-CM | POA: Diagnosis not present

## 2024-08-08 DIAGNOSIS — A539 Syphilis, unspecified: Secondary | ICD-10-CM

## 2024-08-08 NOTE — Progress Notes (Signed)
 HIGH-RISK PREGNANCY VISIT Patient name: Alicia Adkins MRN [id number]  Date of birth: 1996-08-03 Chief Complaint:   Routine Prenatal Visit  History of Present Illness:   Alicia Adkins is a 28 y.o. G84P2002 female at [redacted]w[redacted]d with an Estimated Date of Delivery: 12/27/24 being seen today for ongoing management of a high-risk pregnancy complicated by:  -Syphilis- in process of treatment -Chronic HTN -UTI- has 3 days left  Today she reports no complaints.   Contractions: Not present. Vag. Bleeding: None.  Movement: Present. denies leaking of fluid.      07/21/2024    3:21 PM 04/28/2023   10:03 AM 03/26/2021    8:51 AM 12/17/2020    2:39 PM 05/04/2019    9:16 AM  Depression screen PHQ 2/9  Decreased Interest 0 0 0 0 2  Down, Depressed, Hopeless 0 0 0 0 2  PHQ - 2 Score 0 0 0 0 4  Altered sleeping 1 1 1 1 3   Tired, decreased energy 1 1 1 1 1   Change in appetite 1 0 0 0 1  Feeling bad or failure about yourself  1 0 0 0 0  Trouble concentrating 0 0 0 0 0  Moving slowly or fidgety/restless 0 0 0 0 0  Suicidal thoughts 0 0 0 0 0  PHQ-9 Score 4 2 2 2 9      Current Outpatient Medications  Medication Instructions   aspirin  EC 81 mg, Oral, Daily   aspirin  EC 81 mg, Oral, Daily, Swallow whole.   Blood Pressure Monitor MISC For regular home bp monitoring during pregnancy   cefadroxil  (DURICEF) 500 mg, Oral, 2 times daily   Doxylamine -Pyridoxine  (DICLEGIS ) 10-10 MG TBEC 2 tabs q hs, if sx persist add 1 tab q am on day 3, if sx persist add 1 tab q afternoon on day 4   lidocaine  (XYLOCAINE ) 5 % ointment Apply to external affected area as needed   ondansetron  (ZOFRAN -ODT) 4 mg, Oral, Every 6 hours   prenatal vitamin w/FE, FA (PRENATAL 1 + 1) 27-1 MG TABS tablet Take 1 tablet daily by mouth     Review of Systems:   Pertinent items are noted in HPI Denies abnormal vaginal discharge w/ itching/odor/irritation, headaches, visual changes, shortness of breath, chest pain, abdominal pain,  severe nausea/vomiting, or problems with urination or bowel movements unless otherwise stated above. Pertinent History Reviewed:  Reviewed past medical,surgical, social, obstetrical and family history.  Reviewed problem list, medications and allergies. Physical Assessment:   Vitals:   08/08/24 1548  BP: 130/80  Pulse: (!) 106  Weight: 228 lb 6.4 oz (103.6 kg)  Body mass index is 40.46 kg/m.           Physical Examination:   General appearance: alert, well appearing, and in no distress  Mental status: normal mood, behavior, speech, dress, motor activity, and thought processes  Skin: warm & dry   Extremities:      Cardiovascular: normal heart rate noted  Respiratory: normal respiratory effort, no distress  Abdomen: gravid, soft, non-tender  Pelvic: Cervical exam deferred         Fetal Status:     Movement: Present    Fetal Surveillance Testing today: cephalic,posterior placenta gr 0,CX 3.7 cm,normal ovaries,SVP of fluid 5.4 cm,EFW 273 g 12%,AC 20%,anatomy complete,no obvious abnormalities    Chaperone: N/A    No results found for this or any previous visit (from the past 24 hours).   Assessment & Plan:  High-risk pregnancy: G3P2002 at  [redacted]w[redacted]d with an Estimated Date of Delivery: 12/27/24   1) Chronic HTN -BP stable, asymptomatic -continue growth every 4wks  2) +Syphilis Undergoing treatment- next appt Thursday  Normal anatomy scan today  Meds: No orders of the defined types were placed in this encounter.   Labs/procedures today: anatomy scan  Treatment Plan:  routine OB care and as outlined above  Reviewed: Preterm labor symptoms and general obstetric precautions including but not limited to vaginal bleeding, contractions, leaking of fluid and fetal movement were reviewed in detail with the patient.  All questions were answered.   Follow-up: Return in about 4 weeks (around 09/05/2024) for HROB visit and growth every 4 weeks.   Future Appointments  Date Time Provider  Department Center  09/08/2024  3:45 PM [facility] - [facility] US  [facility] None    Orders Placed This Encounter  Procedures   US  OB Follow Up    Naseer Hearn, DO Attending Obstetrician & Gynecologist, Faculty Practice Center for [facility], [facility]

## 2024-08-08 NOTE — Progress Notes (Signed)
 US  19+6 wks,cephalic,posterior placenta gr 0,CX 3.7 cm,normal ovaries,SVP of fluid 5.4 cm,EFW 273 g 12%,AC 20%,anatomy complete,no obvious abnormalities

## 2024-08-12 ENCOUNTER — Other Ambulatory Visit

## 2024-08-12 ENCOUNTER — Encounter: Admitting: Obstetrics & Gynecology

## 2024-08-12 DIAGNOSIS — Z3A17 17 weeks gestation of pregnancy: Secondary | ICD-10-CM | POA: Diagnosis not present

## 2024-08-12 DIAGNOSIS — A539 Syphilis, unspecified: Secondary | ICD-10-CM | POA: Diagnosis not present

## 2024-08-19 DIAGNOSIS — A539 Syphilis, unspecified: Secondary | ICD-10-CM | POA: Diagnosis not present

## 2024-08-23 ENCOUNTER — Encounter: Payer: Self-pay | Admitting: Women's Health

## 2024-08-23 ENCOUNTER — Ambulatory Visit

## 2024-08-23 VITALS — BP 132/80 | HR 95

## 2024-08-23 DIAGNOSIS — R300 Dysuria: Secondary | ICD-10-CM | POA: Diagnosis not present

## 2024-08-23 DIAGNOSIS — O26892 Other specified pregnancy related conditions, second trimester: Secondary | ICD-10-CM

## 2024-08-23 DIAGNOSIS — Z3A22 22 weeks gestation of pregnancy: Secondary | ICD-10-CM | POA: Diagnosis not present

## 2024-08-23 DIAGNOSIS — R109 Unspecified abdominal pain: Secondary | ICD-10-CM

## 2024-08-23 LAB — POCT URINALYSIS DIPSTICK OB
Glucose, UA: NEGATIVE
Ketones, UA: NEGATIVE
Nitrite, UA: POSITIVE

## 2024-08-23 MED ORDER — NITROFURANTOIN MONOHYD MACRO 100 MG PO CAPS: 100.0000 mg | ORAL_CAPSULE | Freq: Two times a day (BID) | ORAL | 0 refills | Status: DC

## 2024-08-23 NOTE — Addendum Note (Signed)
 Addended by: KIZZIE SUZEN SAUNDERS on: 08/23/2024 04:40 PM   Modules accepted: Orders

## 2024-08-23 NOTE — Progress Notes (Signed)
   NURSE VISIT- UTI SYMPTOMS   SUBJECTIVE:  Alicia Adkins is a 28 y.o. G48P2002 female here for UTI symptoms. She is [redacted]w[redacted]d pregnant. She reports dysuria and flank pain bilaterally.  OBJECTIVE:  LMP 03/22/2024   Appears well, in no apparent distress  Results for orders placed or performed in visit on 08/23/24 (from the past 24 hours)  POC Urinalysis Dipstick OB   Collection Time: 08/23/24  4:09 PM  Result Value Ref Range   Color, UA     Clarity, UA     Glucose, UA Negative Negative   Bilirubin, UA     Ketones, UA neg    Spec Grav, UA     Blood, UA Trace    pH, UA     POC,PROTEIN,UA Moderate (2+) Negative, Trace, Small (1+), Moderate (2+), Large (3+), 4+   Urobilinogen, UA     Nitrite, UA Positive    Leukocytes, UA Large (3+) (A) Negative   Appearance     Odor      ASSESSMENT: Pregnancy [redacted]w[redacted]d with UTI symptoms and positive nitrites  PLAN: Note routed to Luke Fetters, CNM, [facility]   Rx sent by provider today: Yes Urine culture sent Call or return to clinic prn if these symptoms worsen or fail to improve as anticipated. Follow-up: as needed   Elisandra Deshmukh E Teeghan Hammer  08/23/2024 4:10 PM

## 2024-08-24 LAB — URINALYSIS, ROUTINE W REFLEX MICROSCOPIC
Bilirubin, UA: NEGATIVE
Glucose, UA: NEGATIVE
Ketones, UA: NEGATIVE
Nitrite, UA: POSITIVE — AB
RBC, UA: NEGATIVE
Specific Gravity, UA: 1.012 (ref 1.005–1.030)
Urobilinogen, Ur: 0.2 mg/dL (ref 0.2–1.0)
pH, UA: 6.5 (ref 5.0–7.5)

## 2024-08-24 LAB — MICROSCOPIC EXAMINATION
Casts: NONE SEEN /LPF
WBC, UA: >30 /HPF — AB (ref 0–5)

## 2024-08-29 ENCOUNTER — Ambulatory Visit: Payer: Self-pay | Admitting: Women's Health

## 2024-08-29 DIAGNOSIS — R8271 Bacteriuria: Secondary | ICD-10-CM

## 2024-08-29 LAB — URINE CULTURE

## 2024-09-07 ENCOUNTER — Ambulatory Visit (INDEPENDENT_AMBULATORY_CARE_PROVIDER_SITE_OTHER)

## 2024-09-07 ENCOUNTER — Other Ambulatory Visit: Payer: Self-pay | Admitting: Obstetrics & Gynecology

## 2024-09-07 ENCOUNTER — Ambulatory Visit: Admitting: Obstetrics & Gynecology

## 2024-09-07 ENCOUNTER — Encounter: Payer: Self-pay | Admitting: Obstetrics & Gynecology

## 2024-09-07 VITALS — BP 117/77 | HR 98 | Wt 232.0 lb

## 2024-09-07 DIAGNOSIS — Z3A24 24 weeks gestation of pregnancy: Secondary | ICD-10-CM

## 2024-09-07 DIAGNOSIS — O2341 Unspecified infection of urinary tract in pregnancy, first trimester: Secondary | ICD-10-CM

## 2024-09-07 DIAGNOSIS — O365930 Maternal care for other known or suspected poor fetal growth, third trimester, not applicable or unspecified: Secondary | ICD-10-CM | POA: Diagnosis not present

## 2024-09-07 DIAGNOSIS — O2342 Unspecified infection of urinary tract in pregnancy, second trimester: Secondary | ICD-10-CM | POA: Diagnosis not present

## 2024-09-07 DIAGNOSIS — O0992 Supervision of high risk pregnancy, unspecified, second trimester: Secondary | ICD-10-CM | POA: Diagnosis not present

## 2024-09-07 DIAGNOSIS — O10919 Unspecified pre-existing hypertension complicating pregnancy, unspecified trimester: Secondary | ICD-10-CM

## 2024-09-07 DIAGNOSIS — O10912 Unspecified pre-existing hypertension complicating pregnancy, second trimester: Secondary | ICD-10-CM

## 2024-09-07 DIAGNOSIS — O365990 Maternal care for other known or suspected poor fetal growth, unspecified trimester, not applicable or unspecified: Secondary | ICD-10-CM | POA: Insufficient documentation

## 2024-09-07 DIAGNOSIS — O365920 Maternal care for other known or suspected poor fetal growth, second trimester, not applicable or unspecified: Secondary | ICD-10-CM

## 2024-09-07 NOTE — Progress Notes (Signed)
 US  24+1 wks,cephalic,cx 3.1 cm,posterior placenta gr 0,normal ovaries,FHR 143 bpm,SVP of fluid 5.6 cm,EFW 574 g 11%,AC 9%,RI .76,.73=71%

## 2024-09-07 NOTE — Progress Notes (Signed)
 HIGH-RISK PREGNANCY VISIT Patient name: Alicia Adkins MRN [id number]  Date of birth: [birth year] Chief Complaint:   Routine Prenatal Visit  History of Present Illness:   Alicia Adkins is a 28 y.o. G86P2002 female at [redacted]w[redacted]d with an Estimated Date of Delivery: 12/27/24 being seen today for ongoing management of a high-risk pregnancy complicated by:  -Asymptomatic bacteruria, tx'd []  TOC to be collected today -Chronic HTN no meds  Today she reports no complaints.   Contractions: Not present. Vag. Bleeding: None.  Movement: Present. denies leaking of fluid.      07/21/2024    3:21 PM 04/28/2023   10:03 AM 03/26/2021    8:51 AM 12/17/2020    2:39 PM 05/04/2019    9:16 AM  Depression screen PHQ 2/9  Decreased Interest 0 0 0 0 2  Down, Depressed, Hopeless 0 0 0 0 2  PHQ - 2 Score 0 0 0 0 4  Altered sleeping 1 1 1 1 3   Tired, decreased energy 1 1 1 1 1   Change in appetite 1 0 0 0 1  Feeling bad or failure about yourself  1 0 0 0 0  Trouble concentrating 0 0 0 0 0  Moving slowly or fidgety/restless 0 0 0 0 0  Suicidal thoughts 0 0 0 0 0  PHQ-9 Score 4 2 2 2 9      Current Outpatient Medications  Medication Instructions   aspirin  EC 81 mg, Oral, Daily   aspirin  EC 81 mg, Oral, Daily, Swallow whole.   Blood Pressure Monitor MISC For regular home bp monitoring during pregnancy   cefadroxil  (DURICEF) 500 mg, Oral, 2 times daily   Doxylamine -Pyridoxine  (DICLEGIS ) 10-10 MG TBEC 2 tabs q hs, if sx persist add 1 tab q am on day 3, if sx persist add 1 tab q afternoon on day 4   lidocaine  (XYLOCAINE ) 5 % ointment Apply to external affected area as needed   nitrofurantoin  (macrocrystal-monohydrate) (MACROBID ) 100 mg, Oral, 2 times daily, X 7 days   ondansetron  (ZOFRAN -ODT) 4 mg, Oral, Every 6 hours   prenatal vitamin w/FE, FA (PRENATAL 1 + 1) 27-1 MG TABS tablet Take 1 tablet daily by mouth     Review of Systems:   Pertinent items are noted in HPI Denies abnormal vaginal discharge  w/ itching/odor/irritation, headaches, visual changes, shortness of breath, chest pain, abdominal pain, severe nausea/vomiting, or problems with urination or bowel movements unless otherwise stated above. Pertinent History Reviewed:  Reviewed past medical,surgical, social, obstetrical and family history.  Reviewed problem list, medications and allergies. Physical Assessment:   Vitals:   09/07/24 1425  BP: 117/77  Pulse: 98  Weight: 232 lb (105.2 kg)  Body mass index is 41.1 kg/m.           Physical Examination:   General appearance: alert, well appearing, and in no distress  Mental status: normal mood, behavior, speech, dress, motor activity, and thought processes  Skin: warm & dry   Extremities: Edema: None    Cardiovascular: normal heart rate noted  Respiratory: normal respiratory effort, no distress  Abdomen: gravid, soft, non-tender  Pelvic: Cervical exam deferred         Fetal Status:     Movement: Present    Fetal Surveillance Testing today: cephalic,cx 3.1 cm,posterior placenta gr 0,normal ovaries,FHR 143 bpm,SVP of fluid 5.6 cm,EFW 574 g 11%,AC 9%,RI .76,.73=71%    Chaperone: Latisha Cresenzo    No results found for this or any previous visit (from the  past 24 hours).   Assessment & Plan:  High-risk pregnancy: G3P2002 at [redacted]w[redacted]d with an Estimated Date of Delivery: 12/27/24   1) FGR - Today's ultrasound, borderline FGR with normal Dopplers - Reviewed with MFM due to facility limitations if possible will plan for Doppler in 2 weeks, otherwise repeat growth and Doppler in 3 weeks.  Further antepartum testing pending results  2) Chronic HTN - Asymptomatic, no meds  3) UTI- urine collected today   Meds: No orders of the defined types were placed in this encounter.   Labs/procedures today: Growth scan and Dopplers  Treatment Plan: As outlined above and routine OB care  Reviewed: Preterm labor symptoms and general obstetric precautions including but not limited to  vaginal bleeding, contractions, leaking of fluid and fetal movement were reviewed in detail with the patient.  All questions were answered.  Patient has home bp cuff. Check bp weekly, let us  know if >140/90.   Follow-up: No follow-ups on file.   Future Appointments  Date Time Provider Department Center  09/07/2024  2:30 PM Marilynn Nest, DO [facility]  10/05/2024  8:30 AM [facility]  10/05/2024  9:15 AM [facility] US  [facility] None  10/05/2024 10:10 AM Kizzie Suzen SAUNDERS, CNM [facility]  10/19/2024  3:50 PM Casmere Hollenbeck, DO [facility]  11/03/2024  1:30 PM [facility] - [facility] US  [facility] None  11/03/2024  2:30 PM Kizzie Suzen SAUNDERS, CNM [facility]  12/01/2024  3:00 PM [facility] - [facility] US  [facility] None    Orders Placed This Encounter  Procedures   Urine Culture    Nest Marilynn, DO Attending Obstetrician & Gynecologist, Faculty Practice Center for [employer], [facility]

## 2024-09-08 ENCOUNTER — Other Ambulatory Visit

## 2024-09-10 LAB — URINE CULTURE

## 2024-09-12 ENCOUNTER — Ambulatory Visit: Payer: Self-pay | Admitting: Obstetrics & Gynecology

## 2024-09-12 DIAGNOSIS — O2343 Unspecified infection of urinary tract in pregnancy, third trimester: Secondary | ICD-10-CM

## 2024-09-12 DIAGNOSIS — O2342 Unspecified infection of urinary tract in pregnancy, second trimester: Secondary | ICD-10-CM

## 2024-09-12 MED ORDER — CEFADROXIL 500 MG PO CAPS: 500.0000 mg | ORAL_CAPSULE | Freq: Two times a day (BID) | ORAL | 0 refills | Status: AC

## 2024-09-27 ENCOUNTER — Other Ambulatory Visit: Payer: Self-pay | Admitting: Obstetrics & Gynecology

## 2024-09-27 DIAGNOSIS — O365990 Maternal care for other known or suspected poor fetal growth, unspecified trimester, not applicable or unspecified: Secondary | ICD-10-CM

## 2024-09-27 DIAGNOSIS — O10919 Unspecified pre-existing hypertension complicating pregnancy, unspecified trimester: Secondary | ICD-10-CM

## 2024-09-28 ENCOUNTER — Ambulatory Visit (INDEPENDENT_AMBULATORY_CARE_PROVIDER_SITE_OTHER): Admitting: Obstetrics & Gynecology

## 2024-09-28 ENCOUNTER — Encounter: Payer: Self-pay | Admitting: Obstetrics & Gynecology

## 2024-09-28 ENCOUNTER — Ambulatory Visit (INDEPENDENT_AMBULATORY_CARE_PROVIDER_SITE_OTHER)

## 2024-09-28 VITALS — BP 118/73 | HR 106 | Wt 232.0 lb

## 2024-09-28 DIAGNOSIS — O0992 Supervision of high risk pregnancy, unspecified, second trimester: Secondary | ICD-10-CM

## 2024-09-28 DIAGNOSIS — O365990 Maternal care for other known or suspected poor fetal growth, unspecified trimester, not applicable or unspecified: Secondary | ICD-10-CM

## 2024-09-28 DIAGNOSIS — O10919 Unspecified pre-existing hypertension complicating pregnancy, unspecified trimester: Secondary | ICD-10-CM

## 2024-09-28 DIAGNOSIS — O10012 Pre-existing essential hypertension complicating pregnancy, second trimester: Secondary | ICD-10-CM

## 2024-09-28 DIAGNOSIS — Z3A27 27 weeks gestation of pregnancy: Secondary | ICD-10-CM

## 2024-09-28 DIAGNOSIS — O365920 Maternal care for other known or suspected poor fetal growth, second trimester, not applicable or unspecified: Secondary | ICD-10-CM

## 2024-09-28 DIAGNOSIS — O10912 Unspecified pre-existing hypertension complicating pregnancy, second trimester: Secondary | ICD-10-CM

## 2024-09-28 NOTE — Progress Notes (Signed)
 US  27+1 wks,cephalic,CX 3.8 cm,normal ovaries,posterior placenta gr 0,FHR 148 bpm,AFI 18 cm,EFW 916 g 13%,AC 15%

## 2024-09-28 NOTE — Progress Notes (Signed)
 HIGH-RISK PREGNANCY VISIT Patient name: Alicia Adkins MRN [id number]  Date of birth: Feb 18, 1996 Chief Complaint:   Routine Prenatal Visit  History of Present Illness:   Alicia Adkins is a 28 y.o. G79P2002 female at [redacted]w[redacted]d with an Estimated Date of Delivery: 12/27/24 being seen today for ongoing management of a high-risk pregnancy complicated by     ICD-10-CM   1. Supervision of high risk pregnancy in second trimester  O09.92     2. Chronic hypertension affecting pregnancy, no meds with good BP  O10.919      .    Today she reports no complaints. Contractions: Not present. Vag. Bleeding: None.  Movement: Present. denies leaking of fluid.      07/21/2024    3:21 PM 04/28/2023   10:03 AM 03/26/2021    8:51 AM 12/17/2020    2:39 PM 05/04/2019    9:16 AM  Depression screen PHQ 2/9  Decreased Interest 0 0 0 0 2  Down, Depressed, Hopeless 0 0 0 0 2  PHQ - 2 Score 0 0 0 0 4  Altered sleeping 1 1 1 1 3   Tired, decreased energy 1 1 1 1 1   Change in appetite 1 0 0 0 1  Feeling bad or failure about yourself  1 0 0 0 0  Trouble concentrating 0 0 0 0 0  Moving slowly or fidgety/restless 0 0 0 0 0  Suicidal thoughts 0 0 0 0 0  PHQ-9 Score 4 2 2 2 9         07/21/2024    3:21 PM 04/28/2023   10:03 AM 03/26/2021    8:51 AM 12/17/2020    2:40 PM  GAD 7 : Generalized Anxiety Score  Nervous, Anxious, on Edge 0 0 0 0  Control/stop worrying 0 0 0 0  Worry too much - different things 1 0 0 1  Trouble relaxing 1 0 0 1  Restless 0 0 0 0  Easily annoyed or irritable 1 1 0 1  Afraid - awful might happen 0 0 0 0  Total GAD 7 Score 3 1 0 3     Review of Systems:   Pertinent items are noted in HPI Denies abnormal vaginal discharge w/ itching/odor/irritation, headaches, visual changes, shortness of breath, chest pain, abdominal pain, severe nausea/vomiting, or problems with urination or bowel movements unless otherwise stated above. Pertinent History Reviewed:  Reviewed past  medical,surgical, social, obstetrical and family history.  Reviewed problem list, medications and allergies. Physical Assessment:   Vitals:   09/28/24 1504  BP: 118/73  Pulse: (!) 106  Weight: 232 lb (105.2 kg)  Body mass index is 41.1 kg/m.           Physical Examination:   General appearance: alert, well appearing, and in no distress  Mental status: alert, oriented to person, place, and time  Skin: warm & dry   Extremities:      Cardiovascular: normal heart rate noted  Respiratory: normal respiratory effort, no distress  Abdomen: gravid, soft, non-tender  Pelvic: Cervical exam deferred         Fetal Status:     Movement: Present    Fetal Surveillance Testing today: sonogram EFW 13% AC 15%   Chaperone: N/A    No results found for this or any previous visit (from the past 24 hours).  Assessment & Plan:  High-risk pregnancy: G3P2002 at [redacted]w[redacted]d with an Estimated Date of Delivery: 12/27/24      ICD-10-CM  1. Supervision of high risk pregnancy in second trimester  O09.92     2. Chronic hypertension affecting pregnancy, no meds with good BP  O10.919          Meds: No orders of the defined types were placed in this encounter.   Orders: No orders of the defined types were placed in this encounter.    Labs/procedures today: U/S  Treatment Plan:  PN2 next week, repeat sonogram 4 weeks    Follow-up: Return for keep scheduled.   Future Appointments  Date Time Provider Department Center  10/05/2024  8:30 AM [facility]  10/05/2024  9:15 AM [facility] - [facility] US  [facility] None  10/05/2024 10:10 AM Kizzie Suzen SAUNDERS, CNM [facility]  10/19/2024  3:50 PM Marilynn Nest, DO [facility]  11/03/2024  1:30 PM [facility] - [facility] US  [facility] None  11/03/2024  2:30 PM Kizzie Suzen SAUNDERS, CNM [facility]  12/01/2024  3:00 PM [facility] - [facility] US  [facility] None    No orders of the defined types were placed in this encounter.  Vonn VEAR Inch  Attending  Physician for the Center for [facility] 09/28/2024 3:18 PM

## 2024-10-05 ENCOUNTER — Encounter: Admitting: Women's Health

## 2024-10-05 ENCOUNTER — Other Ambulatory Visit

## 2024-10-05 DIAGNOSIS — Z3A28 28 weeks gestation of pregnancy: Secondary | ICD-10-CM

## 2024-10-05 DIAGNOSIS — O0992 Supervision of high risk pregnancy, unspecified, second trimester: Secondary | ICD-10-CM

## 2024-10-05 DIAGNOSIS — Z131 Encounter for screening for diabetes mellitus: Secondary | ICD-10-CM

## 2024-10-07 ENCOUNTER — Other Ambulatory Visit

## 2024-10-07 ENCOUNTER — Ambulatory Visit: Admitting: Obstetrics and Gynecology

## 2024-10-07 ENCOUNTER — Ambulatory Visit (INDEPENDENT_AMBULATORY_CARE_PROVIDER_SITE_OTHER)

## 2024-10-07 ENCOUNTER — Other Ambulatory Visit: Payer: Self-pay | Admitting: Obstetrics & Gynecology

## 2024-10-07 ENCOUNTER — Encounter: Payer: Self-pay | Admitting: Obstetrics and Gynecology

## 2024-10-07 VITALS — BP 111/74 | HR 65 | Wt 234.0 lb

## 2024-10-07 DIAGNOSIS — Z3A28 28 weeks gestation of pregnancy: Secondary | ICD-10-CM

## 2024-10-07 DIAGNOSIS — O10913 Unspecified pre-existing hypertension complicating pregnancy, third trimester: Secondary | ICD-10-CM | POA: Diagnosis not present

## 2024-10-07 DIAGNOSIS — O0992 Supervision of high risk pregnancy, unspecified, second trimester: Secondary | ICD-10-CM

## 2024-10-07 DIAGNOSIS — Z331 Pregnant state, incidental: Secondary | ICD-10-CM

## 2024-10-07 DIAGNOSIS — Z1389 Encounter for screening for other disorder: Secondary | ICD-10-CM

## 2024-10-07 DIAGNOSIS — O10919 Unspecified pre-existing hypertension complicating pregnancy, unspecified trimester: Secondary | ICD-10-CM

## 2024-10-07 DIAGNOSIS — O365990 Maternal care for other known or suspected poor fetal growth, unspecified trimester, not applicable or unspecified: Secondary | ICD-10-CM

## 2024-10-07 DIAGNOSIS — O365930 Maternal care for other known or suspected poor fetal growth, third trimester, not applicable or unspecified: Secondary | ICD-10-CM | POA: Diagnosis not present

## 2024-10-07 LAB — POCT URINALYSIS DIPSTICK OB
Blood, UA: NEGATIVE
Glucose, UA: NEGATIVE
Ketones, UA: NEGATIVE
Leukocytes, UA: NEGATIVE
Nitrite, UA: NEGATIVE
POC,PROTEIN,UA: NEGATIVE

## 2024-10-07 NOTE — Progress Notes (Signed)
   PRENATAL VISIT NOTE  Subjective:  Alicia Adkins is a 28 y.o. G3P2002 at [redacted]w[redacted]d being seen today for ongoing prenatal care.  She is currently monitored for the following issues for this high-risk pregnancy and has Depression; Supervision of high-risk pregnancy; Obesity affecting pregnancy, antepartum; Syphilis; ASB (asymptomatic bacteriuria); Fetal growth restriction antepartum; Poor fetal growth affecting management of mother in third trimester; and Chronic hypertension affecting pregnancy, no meds with good BP on their problem list.  Patient reports no complaints.  Contractions: Not present.  .  Movement: Present. Denies leaking of fluid.   The following portions of the patient's history were reviewed and updated as appropriate: allergies, current medications, past family history, past medical history, past social history, past surgical history and problem list.   Objective:    Vitals:   10/07/24 1045  BP: 111/74  Pulse: 65  Weight: 234 lb (106.1 kg)    Fetal Status:      Movement: Present    General: Alert, oriented and cooperative. Patient is in no acute distress.  Skin: Skin is warm and dry. No rash noted.   Cardiovascular: Normal heart rate noted  Respiratory: Normal respiratory effort, no problems with respiration noted  Abdomen: Soft, gravid, appropriate for gestational age.  Pain/Pressure: Absent     Pelvic: Cervical exam deferred        Extremities: Normal range of motion.  Edema: None  Mental Status: Normal mood and affect. Normal behavior. Normal judgment and thought content.  Fetal surveillance: US  28+3 wks,breech,FHR 121 bpm,cx 2.8 cm,posterior placenta gr 0,AFI 18 cm,normal ovaries,mild left renal pelvic dilatation,RK 4.1 mm,LK 5.6 mm,BPP 8/8,EFW 1028 g 7%,AC 4%,RI .77,.76,.77=93%    Assessment and Plan:  Pregnancy: G3P2002 at [redacted]w[redacted]d 1. Supervision of high risk pregnancy in second trimester (Primary) BP and FHR normal Doing well, feeling regular movement     2. [redacted] weeks gestation of pregnancy GTT and labs today  Decline pap today   3. Chronic hypertension affecting pregnancy Normotensive, no meds   4. Poor fetal growth 7%, elevated dopplers 93% Discussed dx and follow p  Start weekly BPPs/dopplers Start weekly NST  32 weeks . 5. Pregnant state, incidental  - POC Urinalysis Dipstick OB  6. Screening for genitourinary condition S/p abx, repeat culture today  - POC Urinalysis Dipstick OB - Urine Culture   Preterm labor symptoms and general obstetric precautions including but not limited to vaginal bleeding, contractions, leaking of fluid and fetal movement were reviewed in detail with the patient. Please refer to After Visit Summary for other counseling recommendations.     Future Appointments  Date Time Provider Department Center  10/19/2024  3:50 PM Marilynn Nest, DO [facility]  11/03/2024  1:30 PM [facility] - [facility] US  [facility] None  11/03/2024  2:30 PM Kizzie Suzen SAUNDERS, CNM [facility]  12/01/2024  3:00 PM [facility] - [facility] US  [facility] None  12/01/2024  3:50 PM Kizzie Suzen SAUNDERS, CNM [facility]    Nidia Daring, FNP

## 2024-10-07 NOTE — Progress Notes (Signed)
 US  28+3 wks,breech,FHR 121 bpm,cx 2.8 cm,posterior placenta gr 0,AFI 18 cm,normal ovaries,mild left renal pelvic dilatation,RK 4.1 mm,LK 5.6 mm,BPP 8/8,EFW 1028 g 7%,AC 4%,RI .77,.76,.77=93%

## 2024-10-09 ENCOUNTER — Ambulatory Visit: Payer: Self-pay | Admitting: Obstetrics and Gynecology

## 2024-10-09 LAB — URINE CULTURE

## 2024-10-10 ENCOUNTER — Encounter: Payer: Self-pay | Admitting: Obstetrics & Gynecology

## 2024-10-10 ENCOUNTER — Ambulatory Visit: Payer: Self-pay | Admitting: Women's Health

## 2024-10-10 ENCOUNTER — Ambulatory Visit (INDEPENDENT_AMBULATORY_CARE_PROVIDER_SITE_OTHER): Admitting: *Deleted

## 2024-10-10 VITALS — BP 118/71 | HR 102

## 2024-10-10 DIAGNOSIS — O365990 Maternal care for other known or suspected poor fetal growth, unspecified trimester, not applicable or unspecified: Secondary | ICD-10-CM

## 2024-10-10 DIAGNOSIS — Z3A28 28 weeks gestation of pregnancy: Secondary | ICD-10-CM | POA: Diagnosis not present

## 2024-10-10 DIAGNOSIS — O365930 Maternal care for other known or suspected poor fetal growth, third trimester, not applicable or unspecified: Secondary | ICD-10-CM

## 2024-10-10 DIAGNOSIS — O10913 Unspecified pre-existing hypertension complicating pregnancy, third trimester: Secondary | ICD-10-CM | POA: Diagnosis not present

## 2024-10-10 DIAGNOSIS — A539 Syphilis, unspecified: Secondary | ICD-10-CM

## 2024-10-10 DIAGNOSIS — O10919 Unspecified pre-existing hypertension complicating pregnancy, unspecified trimester: Secondary | ICD-10-CM

## 2024-10-10 MED ORDER — FERROUS SULFATE 325 (65 FE) MG PO TABS: 325.0000 mg | ORAL_TABLET | ORAL | 2 refills | Status: AC

## 2024-10-10 NOTE — Progress Notes (Signed)
   NURSE VISIT- NST  SUBJECTIVE:  Alicia Adkins is a 28 y.o. G29P2002 female at [redacted]w[redacted]d, here for a NST for pregnancy complicated by [facility], FGR, and Morbid obesity (BMI >=40).  She reports active fetal movement, contractions: none, vaginal bleeding: none, membranes: intact.   OBJECTIVE:  BP 118/71   Pulse (!) 102   LMP 03/22/2024   Appears well, no apparent distress  No results found for this or any previous visit (from the past 24 hours).  NST: FHR baseline 140 bpm, Variability: moderate, Accelerations:present, Decelerations:  Absent= Cat 1/reactive Toco: none   ASSESSMENT: G3P2002 at [redacted]w[redacted]d with CHTN, FGR, and Morbid obesity (BMI >=40) NST reactive  PLAN: EFM strip reviewed by Dr. Jayne   Recommendations: keep next appointment as scheduled    Rutherford Rover  10/10/2024 3:25 PM

## 2024-10-11 LAB — RPR, QUANT+TP ABS (REFLEX)
Rapid Plasma Reagin, Quant: 1:8 {titer} — ABNORMAL HIGH
T Pallidum Abs: REACTIVE — AB

## 2024-10-11 LAB — CBC
Hematocrit: 30.4 % — ABNORMAL LOW (ref 34.0–46.6)
Hemoglobin: 9.9 g/dL — ABNORMAL LOW (ref 11.1–15.9)
MCH: 30.1 pg (ref 26.6–33.0)
MCHC: 32.6 g/dL (ref 31.5–35.7)
MCV: 92 fL (ref 79–97)
Platelets: 333 x10E3/uL (ref 150–450)
RBC: 3.29 x10E6/uL — ABNORMAL LOW (ref 3.77–5.28)
RDW: 13.4 % (ref 11.7–15.4)
WBC: 9.8 x10E3/uL (ref 3.4–10.8)

## 2024-10-11 LAB — ANTIBODY SCREEN: Antibody Screen: NEGATIVE

## 2024-10-11 LAB — GLUCOSE TOLERANCE, 2 HOURS W/ 1HR
Glucose, 1 hour: 130 mg/dL (ref 70–179)
Glucose, 2 hour: 107 mg/dL (ref 70–152)
Glucose, Fasting: 87 mg/dL (ref 70–91)

## 2024-10-11 LAB — HIV ANTIBODY (ROUTINE TESTING W REFLEX): HIV Screen 4th Generation wRfx: NONREACTIVE

## 2024-10-11 LAB — RPR: RPR Ser Ql: REACTIVE — AB

## 2024-10-13 ENCOUNTER — Encounter: Payer: Self-pay | Admitting: Advanced Practice Midwife

## 2024-10-13 ENCOUNTER — Ambulatory Visit: Admitting: *Deleted

## 2024-10-13 ENCOUNTER — Ambulatory Visit (INDEPENDENT_AMBULATORY_CARE_PROVIDER_SITE_OTHER): Admitting: Advanced Practice Midwife

## 2024-10-13 VITALS — BP 119/77 | HR 101 | Wt 237.0 lb

## 2024-10-13 DIAGNOSIS — E6609 Other obesity due to excess calories: Secondary | ICD-10-CM

## 2024-10-13 DIAGNOSIS — Z3A29 29 weeks gestation of pregnancy: Secondary | ICD-10-CM

## 2024-10-13 DIAGNOSIS — O365990 Maternal care for other known or suspected poor fetal growth, unspecified trimester, not applicable or unspecified: Secondary | ICD-10-CM

## 2024-10-13 DIAGNOSIS — O10913 Unspecified pre-existing hypertension complicating pregnancy, third trimester: Secondary | ICD-10-CM | POA: Diagnosis not present

## 2024-10-13 DIAGNOSIS — O0993 Supervision of high risk pregnancy, unspecified, third trimester: Secondary | ICD-10-CM

## 2024-10-13 DIAGNOSIS — O365930 Maternal care for other known or suspected poor fetal growth, third trimester, not applicable or unspecified: Secondary | ICD-10-CM

## 2024-10-13 DIAGNOSIS — O99213 Obesity complicating pregnancy, third trimester: Secondary | ICD-10-CM | POA: Diagnosis not present

## 2024-10-13 DIAGNOSIS — O10919 Unspecified pre-existing hypertension complicating pregnancy, unspecified trimester: Secondary | ICD-10-CM

## 2024-10-13 DIAGNOSIS — Z23 Encounter for immunization: Secondary | ICD-10-CM

## 2024-10-13 NOTE — Patient Instructions (Signed)
 Alicia Adkins, I greatly value your feedback.  If you receive a survey following your visit with us  today, we appreciate you taking the time to fill it out.  Thanks, Sherrell Ely, CNM   [facility] HAS MOVED!!! It is now [facility] at [facility] ([address] [place], [place] [address]) Entrance located off of [address] Free 24/7 valet parking   Go to [email or web address] to register for FREE online childbirth classes    Call the office [phone]) or go to [facility] if: You begin to have strong, frequent contractions Your water breaks.  Sometimes it is a big gush of fluid, sometimes it is just a trickle that keeps getting your panties wet or running down your legs You have vaginal bleeding.  It is normal to have a small amount of spotting if your cervix was checked.  You don't feel your baby moving like normal.  If you don't, get you something to eat and drink and lay down and focus on feeling your baby move.  You should feel at least 10 movements in 2 hours.  If you don't, you should call the office or go to [facility].    Tdap Vaccine It is recommended that you get the Tdap vaccine during the third trimester of EACH pregnancy to help protect your baby from getting pertussis (whooping cough) 27-36 weeks is the BEST time to do this so that you can pass the protection on to your baby. During pregnancy is better than after pregnancy, but if you are unable to get it during pregnancy it will be offered at the hospital.  You will be offered this vaccine in the office after 27 weeks. If you do not have health insurance, you can get this vaccine at the health department or your family doctor Everyone who will be around your baby should also be up-to-date on their vaccines. Adults (who are not pregnant) only need 1 dose of Tdap during adulthood.   Third Trimester of Pregnancy The third trimester is from week 29 through week 42, months 7 through 9. The third  trimester is a time when the fetus is growing rapidly. At the end of the ninth month, the fetus is about 20 inches in length and weighs 6-10 pounds.  BODY CHANGES Your body goes through many changes during pregnancy. The changes vary from woman to woman.  Your weight will continue to increase. You can expect to gain 25-35 pounds (11-16 kg) by the end of the pregnancy. You may begin to get stretch marks on your hips, abdomen, and breasts. You may urinate more often because the fetus is moving lower into your pelvis and pressing on your bladder. You may develop or continue to have heartburn as a result of your pregnancy. You may develop constipation because certain hormones are causing the muscles that push waste through your intestines to slow down. You may develop hemorrhoids or swollen, bulging veins (varicose veins). You may have pelvic pain because of the weight gain and pregnancy hormones relaxing your joints between the bones in your pelvis. Backaches may result from overexertion of the muscles supporting your posture. You may have changes in your hair. These can include thickening of your hair, rapid growth, and changes in texture. Some women also have hair loss during or after pregnancy, or hair that feels dry or thin. Your hair will most likely return to normal after your baby is born. Your breasts will continue to grow and be tender. A  yellow discharge may leak from your breasts called colostrum. Your belly button may stick out. You may feel short of breath because of your expanding uterus. You may notice the fetus dropping, or moving lower in your abdomen. You may have a bloody mucus discharge. This usually occurs a few days to a week before labor begins. Your cervix becomes thin and soft (effaced) near your due date. WHAT TO EXPECT AT YOUR PRENATAL EXAMS  You will have prenatal exams every 2 weeks until week 36. Then, you will have weekly prenatal exams. During a routine prenatal  visit: You will be weighed to make sure you and the fetus are growing normally. Your blood pressure is taken. Your abdomen will be measured to track your baby's growth. The fetal heartbeat will be listened to. Any test results from the previous visit will be discussed. You may have a cervical check near your due date to see if you have effaced. At around 36 weeks, your caregiver will check your cervix. At the same time, your caregiver will also perform a test on the secretions of the vaginal tissue. This test is to determine if a type of bacteria, Group B streptococcus, is present. Your caregiver will explain this further. Your caregiver may ask you: What your birth plan is. How you are feeling. If you are feeling the baby move. If you have had any abnormal symptoms, such as leaking fluid, bleeding, severe headaches, or abdominal cramping. If you have any questions. Other tests or screenings that may be performed during your third trimester include: Blood tests that check for low iron levels (anemia). Fetal testing to check the health, activity level, and growth of the fetus. Testing is done if you have certain medical conditions or if there are problems during the pregnancy. FALSE LABOR You may feel small, irregular contractions that eventually go away. These are called Braxton Hicks contractions, or false labor. Contractions may last for hours, days, or even weeks before true labor sets in. If contractions come at regular intervals, intensify, or become painful, it is best to be seen by your caregiver.  SIGNS OF LABOR  Menstrual-like cramps. Contractions that are 5 minutes apart or less. Contractions that start on the top of the uterus and spread down to the lower abdomen and back. A sense of increased pelvic pressure or back pain. A watery or bloody mucus discharge that comes from the vagina. If you have any of these signs before the 37th week of pregnancy, call your caregiver right away.  You need to go to the hospital to get checked immediately. HOME CARE INSTRUCTIONS  Avoid all smoking, herbs, alcohol, and unprescribed drugs. These chemicals affect the formation and growth of the baby. Follow your caregiver's instructions regarding medicine use. There are medicines that are either safe or unsafe to take during pregnancy. Exercise only as directed by your caregiver. Experiencing uterine cramps is a good sign to stop exercising. Continue to eat regular, healthy meals. Wear a good support bra for breast tenderness. Do not use hot tubs, steam rooms, or saunas. Wear your seat belt at all times when driving. Avoid raw meat, uncooked cheese, cat litter boxes, and soil used by cats. These carry germs that can cause birth defects in the baby. Take your prenatal vitamins. Try taking a stool softener (if your caregiver approves) if you develop constipation. Eat more high-fiber foods, such as fresh vegetables or fruit and whole grains. Drink plenty of fluids to keep your urine clear or pale yellow.  Take warm sitz baths to soothe any pain or discomfort caused by hemorrhoids. Use hemorrhoid cream if your caregiver approves. If you develop varicose veins, wear support hose. Elevate your feet for 15 minutes, 3-4 times a day. Limit salt in your diet. Avoid heavy lifting, wear low heal shoes, and practice good posture. Rest a lot with your legs elevated if you have leg cramps or low back pain. Visit your dentist if you have not gone during your pregnancy. Use a soft toothbrush to brush your teeth and be gentle when you floss. A sexual relationship may be continued unless your caregiver directs you otherwise. Do not travel far distances unless it is absolutely necessary and only with the approval of your caregiver. Take prenatal classes to understand, practice, and ask questions about the labor and delivery. Make a trial run to the hospital. Pack your hospital bag. Prepare the baby's  nursery. Continue to go to all your prenatal visits as directed by your caregiver. SEEK MEDICAL CARE IF: You are unsure if you are in labor or if your water has broken. You have dizziness. You have mild pelvic cramps, pelvic pressure, or nagging pain in your abdominal area. You have persistent nausea, vomiting, or diarrhea. You have a bad smelling vaginal discharge. You have pain with urination. SEEK IMMEDIATE MEDICAL CARE IF:  You have a fever. You are leaking fluid from your vagina. You have spotting or bleeding from your vagina. You have severe abdominal cramping or pain. You have rapid weight loss or gain. You have shortness of breath with chest pain. You notice sudden or extreme swelling of your face, hands, ankles, feet, or legs. You have not felt your baby move in over an hour. You have severe headaches that do not go away with medicine. You have vision changes. Document Released: 12/02/2001 Document Revised: 12/13/2013 Document Reviewed: 02/08/2013 [facility] Patient Information 2015 [place], [place]. This information is not intended to replace advice given to you by your health care provider. Make sure you discuss any questions you have with your health care provider.

## 2024-10-13 NOTE — Progress Notes (Unsigned)
 HIGH-RISK PREGNANCY VISIT Patient name: Alicia Adkins MRN [id number]  Date of birth: [birth year] Chief Complaint:   Routine Prenatal Visit  History of Present Illness:   Alicia Adkins is a 28 y.o. G65P2002 female at [redacted]w[redacted]d with an Estimated Date of Delivery: 12/27/24 being seen today for ongoing management of a high-risk pregnancy complicated by chronic hypertension currently on no meds, BMI 40. FGR w/elevated dopplers   Today she reports no complaints. Contractions: Not present. Vag. Bleeding: None.  Movement: Present. denies leaking of fluid.      10/07/2024   11:41 AM 07/21/2024    3:21 PM 04/28/2023   10:03 AM 03/26/2021    8:51 AM 12/17/2020    2:39 PM  Depression screen PHQ 2/9  Decreased Interest 0 0 0 0 0  Down, Depressed, Hopeless 0 0 0 0 0  PHQ - 2 Score 0 0 0 0 0  Altered sleeping 1 1 1 1 1   Tired, decreased energy 1 1 1 1 1   Change in appetite 0 1 0 0 0  Feeling bad or failure about yourself  0 1 0 0 0  Trouble concentrating 0 0 0 0 0  Moving slowly or fidgety/restless 0 0 0 0 0  Suicidal thoughts 0 0 0 0 0  PHQ-9 Score 2 4 2 2 2         07/21/2024    3:21 PM 04/28/2023   10:03 AM 03/26/2021    8:51 AM 12/17/2020    2:40 PM  GAD 7 : Generalized Anxiety Score  Nervous, Anxious, on Edge 0 0 0 0  Control/stop worrying 0 0 0 0  Worry too much - different things 1 0 0 1  Trouble relaxing 1 0 0 1  Restless 0 0 0 0  Easily annoyed or irritable 1 1 0 1  Afraid - awful might happen 0 0 0 0  Total GAD 7 Score 3 1 0 3     Review of Systems:   Pertinent items are noted in HPI Denies abnormal vaginal discharge w/ itching/odor/irritation, headaches, visual changes, shortness of breath, chest pain, abdominal pain, severe nausea/vomiting, or problems with urination or bowel movements unless otherwise stated above. Pertinent History Reviewed:  Reviewed past medical,surgical, social, obstetrical and family history.  Reviewed problem list, medications and  allergies. Physical Assessment:   Vitals:   10/13/24 1353  BP: 119/77  Pulse: (!) 101  Weight: 237 lb (107.5 kg)  Body mass index is 41.98 kg/m.           Physical Examination:   General appearance: alert, well appearing, and in no distress  Mental status: alert, oriented to person, place, and time  Skin: warm & dry   Extremities:      Cardiovascular: normal heart rate noted  Respiratory: normal respiratory effort, no distress  Abdomen: gravid, soft, non-tender  Pelvic: Cervical exam deferred         Chaperone: N/A    Fetal Status:     Movement: Present    Fetal Surveillance Testing today: NST: FHR baseline 135 bpm, Variability: moderate, Accelerations:present, Decelerations:  Absent= Cat 1/Non-reactive but appropriate for gestational age      No results found for this or any previous visit (from the past 24 hours).  Assessment & Plan:  High-risk pregnancy: G3P2002 at [redacted]w[redacted]d with an Estimated Date of Delivery: 12/27/24   1. [redacted] weeks gestation of pregnancy (Primary)  - Flu vaccine trivalent PF, 6mos and older(Flulaval,Afluria,Fluarix,Fluzone)  2. Supervision of  high risk pregnancy in third trimester  - Flu vaccine trivalent PF, 6mos and older(Flulaval,Afluria,Fluarix,Fluzone)  3. Needs flu shot  - Flu vaccine trivalent PF, 6mos and older(Flulaval,Afluria,Fluarix,Fluzone)  4. Chronic hypertension affecting pregnancy, no meds with good BP   5. Fetal growth restriction antepartum Continue twice weekly testing  6. Other obesity due to excess calories affecting pregnancy, antepartum Per FGR testing    Meds: No orders of the defined types were placed in this encounter.   Orders:  Orders Placed This Encounter  Procedures   Flu vaccine trivalent PF, 6mos and older(Flulaval,Afluria,Fluarix,Fluzone)     Labs/procedures today: NST   Reviewed: Preterm labor symptoms and general obstetric precautions including but not limited to vaginal bleeding, contractions,  leaking of fluid and fetal movement were reviewed in detail with the patient.  All questions were answered. Does have home bp cuff. Office bp cuff given: not applicable. Check bp weekly, let us  know if consistently >140 and/or >90.  Follow-up: No follow-ups on file.   Future Appointments  Date Time Provider Department Center  10/20/2024  2:15 PM [facility] - [facility] US  [facility] None  10/20/2024  3:30 PM Kizzie Suzen SAUNDERS, CNM [facility]  10/27/2024  2:15 PM [facility] - [facility] US  [facility] None  10/27/2024  3:30 PM Kizzie Suzen SAUNDERS, CNM [facility]  11/03/2024  1:30 PM [facility] - [facility] US  [facility] None  11/03/2024  2:30 PM Kizzie Suzen SAUNDERS, CNM [facility]  11/07/2024  3:10 PM [facility] NURSE [facility]  11/10/2024  3:00 PM [facility] - [facility] US  [facility] None  11/10/2024  3:50 PM Marilynn Nest, DO [facility]  11/16/2024 10:00 AM [facility] - FT IMG 2 [facility] None  11/16/2024 10:50 AM Kizzie Suzen SAUNDERS, CNM [facility]  11/21/2024  3:10 PM [facility] NURSE [facility]  11/24/2024  2:15 PM [facility] - [facility] US  [facility] None  11/24/2024  3:10 PM Kizzie Suzen SAUNDERS, CNM [facility]  11/28/2024  3:10 PM [facility] NURSE [facility]  12/01/2024  3:00 PM [facility] - [facility] US  [facility] None  12/01/2024  3:50 PM Kizzie Suzen SAUNDERS, CNM [facility]  12/05/2024  3:10 PM [facility] NURSE [facility]  12/08/2024 10:00 AM [facility] - [facility] US  [facility] None  12/08/2024 10:50 AM Newton Mering, CNM [facility]  12/13/2024  2:15 PM [facility] - FT IMG 2 [facility] None  12/13/2024  3:10 PM Jayne Vonn DEL, MD [facility]  12/21/2024  2:15 PM [facility] - [facility] US  [facility] None  12/26/2024  3:10 PM [facility] NURSE [facility]    Orders Placed This Encounter  Procedures   Flu vaccine trivalent PF, 6mos and older(Flulaval,Afluria,Fluarix,Fluzone)   Mering Newton , DNP, CNM [facility] 10/13/2024 2:19 PM

## 2024-10-13 NOTE — Progress Notes (Deleted)
   NURSE VISIT- NST  SUBJECTIVE:  Alicia Adkins is a 28 y.o. G28P2002 female at [redacted]w[redacted]d, here for a NST for pregnancy complicated by {NST Pregnancy complicated by:23184}.  She reports {Fetal Movement:20184} fetal movement, contractions: {obgyn contractions reg/irreg:312982}, vaginal bleeding: {desc; vaginal bleeding:14141}, membranes: {pe membranes intrapartum:314523}.   OBJECTIVE:  LMP 03/22/2024   Appears well, no apparent distress  No results found for this or any previous visit (from the past 24 hours).  NST: FHR baseline *** bpm, Variability: {fhr variability:21617::moderate}, Accelerations:{DESC; PRESENT/NOT PRESENT:21021351::present}, Decelerations:  {fhr decel present:21619::Absent}= Cat ***/{fhr accels:25447::reactive} Toco: {obgyn contractions reg/irreg:312982}   ASSESSMENT: G3P2002 at [redacted]w[redacted]d with {NST Pregnancy complicated by:23184} NST {NST Findings:23188::reactive}  PLAN: EFM strip reviewed by {Blank single:19197::Dr. Eure,Dr. Ozan,Dr. Ervin,Dr. Eldonna Luke Fetters, CNM, WHNP,Fran Danniela Mcbrearty-Dishmon, CNM,Kim Loreli, CNM}   Recommendations: {Blank single:19197::keep next appointment as scheduled,work-in for BPP,send to [facility] *** notified,send to [facility] L&D *** notified}    Alicia Adkins  10/13/2024 1:54 PM

## 2024-10-19 ENCOUNTER — Other Ambulatory Visit: Payer: Self-pay | Admitting: Obstetrics & Gynecology

## 2024-10-19 ENCOUNTER — Encounter: Admitting: Obstetrics & Gynecology

## 2024-10-19 DIAGNOSIS — O365990 Maternal care for other known or suspected poor fetal growth, unspecified trimester, not applicable or unspecified: Secondary | ICD-10-CM

## 2024-10-19 DIAGNOSIS — O10919 Unspecified pre-existing hypertension complicating pregnancy, unspecified trimester: Secondary | ICD-10-CM

## 2024-10-20 ENCOUNTER — Encounter: Admitting: Obstetrics & Gynecology

## 2024-10-20 ENCOUNTER — Ambulatory Visit: Admitting: Women's Health

## 2024-10-20 ENCOUNTER — Other Ambulatory Visit

## 2024-10-20 ENCOUNTER — Ambulatory Visit (INDEPENDENT_AMBULATORY_CARE_PROVIDER_SITE_OTHER)

## 2024-10-20 ENCOUNTER — Encounter: Payer: Self-pay | Admitting: Women's Health

## 2024-10-20 VITALS — BP 121/78 | HR 109 | Wt 236.0 lb

## 2024-10-20 DIAGNOSIS — O0991 Supervision of high risk pregnancy, unspecified, first trimester: Secondary | ICD-10-CM

## 2024-10-20 DIAGNOSIS — O10913 Unspecified pre-existing hypertension complicating pregnancy, third trimester: Secondary | ICD-10-CM | POA: Diagnosis not present

## 2024-10-20 DIAGNOSIS — O10013 Pre-existing essential hypertension complicating pregnancy, third trimester: Secondary | ICD-10-CM | POA: Diagnosis not present

## 2024-10-20 DIAGNOSIS — Z3A30 30 weeks gestation of pregnancy: Secondary | ICD-10-CM

## 2024-10-20 DIAGNOSIS — O365930 Maternal care for other known or suspected poor fetal growth, third trimester, not applicable or unspecified: Secondary | ICD-10-CM | POA: Diagnosis not present

## 2024-10-20 DIAGNOSIS — O0993 Supervision of high risk pregnancy, unspecified, third trimester: Secondary | ICD-10-CM | POA: Diagnosis not present

## 2024-10-20 DIAGNOSIS — Z6841 Body Mass Index (BMI) 40.0 and over, adult: Secondary | ICD-10-CM

## 2024-10-20 DIAGNOSIS — O365990 Maternal care for other known or suspected poor fetal growth, unspecified trimester, not applicable or unspecified: Secondary | ICD-10-CM

## 2024-10-20 DIAGNOSIS — Z23 Encounter for immunization: Secondary | ICD-10-CM | POA: Diagnosis not present

## 2024-10-20 DIAGNOSIS — O10919 Unspecified pre-existing hypertension complicating pregnancy, unspecified trimester: Secondary | ICD-10-CM

## 2024-10-20 NOTE — Patient Instructions (Signed)
 Alicia Adkins, thank you for choosing our office today! We appreciate the opportunity to meet your healthcare needs. You may receive a short survey by mail, e-mail, or through [employer]. If you are happy with your care we would appreciate if you could take just a few minutes to complete the survey questions. We read all of your comments and take your feedback very seriously. Thank you again for choosing our office.  Center for [employer] Team at [facility]  [facility] at [facility] ([address] [place], [place] [address]) Entrance C, located off of [address] Free 24/7 valet parking   CLASSES: Go to [email or web address] to register for classes (childbirth, breastfeeding, waterbirth, infant CPR, daddy bootcamp, etc.)  Call the office [phone]) or go to [facility] if: You begin to have strong, frequent contractions Your water breaks.  Sometimes it is a big gush of fluid, sometimes it is just a trickle that keeps getting your panties wet or running down your legs You have vaginal bleeding.  It is normal to have a small amount of spotting if your cervix was checked.  You don't feel your baby moving like normal.  If you don't, get you something to eat and drink and lay down and focus on feeling your baby move.   If your baby is still not moving like normal, you should call the office or go to [facility].  Call the office [phone]) or go to [facility] for these signs of pre-eclampsia: Severe headache that does not go away with Tylenol  Visual changes- seeing spots, double, blurred vision Pain under your right breast or upper abdomen that does not go away with Tums or heartburn medicine Nausea and/or vomiting Severe swelling in your hands, feet, and face   Tdap Vaccine It is recommended that you get the Tdap vaccine during the third trimester of EACH pregnancy to help protect your baby from getting pertussis (whooping cough) 27-36 weeks is the BEST time to do  this so that you can pass the protection on to your baby. During pregnancy is better than after pregnancy, but if you are unable to get it during pregnancy it will be offered at the hospital.  You can get this vaccine with us , at the health department, your family doctor, or some local pharmacies Everyone who will be around your baby should also be up-to-date on their vaccines before the baby comes. Adults (who are not pregnant) only need 1 dose of Tdap during adulthood.   [facility] Pediatricians/Family Doctors [facility]): [address] Luba BROCKS, [phone]           [facility]: [address], [phone]                [facility]): [address], [phone] (call to ask if accepting patients) [facility] Department: [address], [place], [phone]    [facility] Pediatricians/Family Doctors [facility]): [phone] [address], [phone] [facility]: [address] [place], [phone] [facility]: [address], [phone]  [facility] Doctors  [facility]): [phone] [facility]: [address] [phone]   [facility] Doctors [facility]: [address] [phone]  [facility] Family Doctors  [facility]: [phone], [phone]  Home Blood Pressure Monitoring for Patients   Your provider has recommended that you check your  blood pressure (BP) at least once a week at home. If you do not have a blood pressure cuff at home, one will be provided for you. Contact your provider if you have not received your monitor within 1 week.   Helpful Tips for Accurate Home Blood Pressure Checks  Don't smoke, exercise, or drink caffeine 30 minutes before checking your BP Use the restroom before checking your BP (a full bladder can raise your  pressure) Relax in a comfortable upright chair Feet on the ground Left arm resting comfortably on a flat surface at the level of your heart Legs uncrossed Back supported Sit quietly and don't talk Place the cuff on your bare arm Adjust snuggly, so that only two fingertips can fit between your skin and the top of the cuff Check 2 readings separated by at least one minute Keep a log of your BP readings For a visual, please reference this diagram: http://ccnc.care/bpdiagram  Provider Name: [facility]     Phone: [phone]  Zone 1: ALL CLEAR  Continue to monitor your symptoms:  BP reading is less than 140 (top number) or less than 90 (bottom number)  No right upper stomach pain No headaches or seeing spots No feeling nauseated or throwing up No swelling in face and hands  Zone 2: CAUTION Call your doctor's office for any of the following:  BP reading is greater than 140 (top number) or greater than 90 (bottom number)  Stomach pain under your ribs in the middle or right side Headaches or seeing spots Feeling nauseated or throwing up Swelling in face and hands  Zone 3: EMERGENCY  Seek immediate medical care if you have any of the following:  BP reading is greater than160 (top number) or greater than 110 (bottom number) Severe headaches not improving with Tylenol  Serious difficulty catching your breath Any worsening symptoms from Zone 2   Third Trimester of Pregnancy The third trimester is from week 29 through week 42, months 7 through 9. The third trimester is a time when the fetus is growing rapidly. At the end of the ninth month, the fetus is about 20 inches in length and weighs 6-10 pounds.  BODY CHANGES Your body goes through many changes during pregnancy. The changes vary from woman to woman.  Your weight will continue to increase. You can expect to gain 25-35 pounds (11-16 kg) by the end of the pregnancy. You may begin to get stretch marks on your hips, abdomen,  and breasts. You may urinate more often because the fetus is moving lower into your pelvis and pressing on your bladder. You may develop or continue to have heartburn as a result of your pregnancy. You may develop constipation because certain hormones are causing the muscles that push waste through your intestines to slow down. You may develop hemorrhoids or swollen, bulging veins (varicose veins). You may have pelvic pain because of the weight gain and pregnancy hormones relaxing your joints between the bones in your pelvis. Backaches may result from overexertion of the muscles supporting your posture. You may have changes in your hair. These can include thickening of your hair, rapid growth, and changes in texture. Some women also have hair loss during or after pregnancy, or hair that feels dry or thin. Your hair will most likely return to normal after your baby is born. Your breasts will continue to grow and be tender. A yellow discharge may leak from your breasts called colostrum. Your belly button may stick out. You may  feel short of breath because of your expanding uterus. You may notice the fetus dropping, or moving lower in your abdomen. You may have a bloody mucus discharge. This usually occurs a few days to a week before labor begins. Your cervix becomes thin and soft (effaced) near your due date. WHAT TO EXPECT AT YOUR PRENATAL EXAMS  You will have prenatal exams every 2 weeks until week 36. Then, you will have weekly prenatal exams. During a routine prenatal visit: You will be weighed to make sure you and the fetus are growing normally. Your blood pressure is taken. Your abdomen will be measured to track your baby's growth. The fetal heartbeat will be listened to. Any test results from the previous visit will be discussed. You may have a cervical check near your due date to see if you have effaced. At around 36 weeks, your caregiver will check your cervix. At the same time, your  caregiver will also perform a test on the secretions of the vaginal tissue. This test is to determine if a type of bacteria, Group B streptococcus, is present. Your caregiver will explain this further. Your caregiver may ask you: What your birth plan is. How you are feeling. If you are feeling the baby move. If you have had any abnormal symptoms, such as leaking fluid, bleeding, severe headaches, or abdominal cramping. If you have any questions. Other tests or screenings that may be performed during your third trimester include: Blood tests that check for low iron levels (anemia). Fetal testing to check the health, activity level, and growth of the fetus. Testing is done if you have certain medical conditions or if there are problems during the pregnancy. FALSE LABOR You may feel small, irregular contractions that eventually go away. These are called Braxton Hicks contractions, or false labor. Contractions may last for hours, days, or even weeks before true labor sets in. If contractions come at regular intervals, intensify, or become painful, it is best to be seen by your caregiver.  SIGNS OF LABOR  Menstrual-like cramps. Contractions that are 5 minutes apart or less. Contractions that start on the top of the uterus and spread down to the lower abdomen and back. A sense of increased pelvic pressure or back pain. A watery or bloody mucus discharge that comes from the vagina. If you have any of these signs before the 37th week of pregnancy, call your caregiver right away. You need to go to the hospital to get checked immediately. HOME CARE INSTRUCTIONS  Avoid all smoking, herbs, alcohol, and unprescribed drugs. These chemicals affect the formation and growth of the baby. Follow your caregiver's instructions regarding medicine use. There are medicines that are either safe or unsafe to take during pregnancy. Exercise only as directed by your caregiver. Experiencing uterine cramps is a good sign to  stop exercising. Continue to eat regular, healthy meals. Wear a good support bra for breast tenderness. Do not use hot tubs, steam rooms, or saunas. Wear your seat belt at all times when driving. Avoid raw meat, uncooked cheese, cat litter boxes, and soil used by cats. These carry germs that can cause birth defects in the baby. Take your prenatal vitamins. Try taking a stool softener (if your caregiver approves) if you develop constipation. Eat more high-fiber foods, such as fresh vegetables or fruit and whole grains. Drink plenty of fluids to keep your urine clear or pale yellow. Take warm sitz baths to soothe any pain or discomfort caused by hemorrhoids. Use hemorrhoid cream if  your caregiver approves. If you develop varicose veins, wear support hose. Elevate your feet for 15 minutes, 3-4 times a day. Limit salt in your diet. Avoid heavy lifting, wear low heal shoes, and practice good posture. Rest a lot with your legs elevated if you have leg cramps or low back pain. Visit your dentist if you have not gone during your pregnancy. Use a soft toothbrush to brush your teeth and be gentle when you floss. A sexual relationship may be continued unless your caregiver directs you otherwise. Do not travel far distances unless it is absolutely necessary and only with the approval of your caregiver. Take prenatal classes to understand, practice, and ask questions about the labor and delivery. Make a trial run to the hospital. Pack your hospital bag. Prepare the baby's nursery. Continue to go to all your prenatal visits as directed by your caregiver. SEEK MEDICAL CARE IF: You are unsure if you are in labor or if your water has broken. You have dizziness. You have mild pelvic cramps, pelvic pressure, or nagging pain in your abdominal area. You have persistent nausea, vomiting, or diarrhea. You have a bad smelling vaginal discharge. You have pain with urination. SEEK IMMEDIATE MEDICAL CARE IF:  You  have a fever. You are leaking fluid from your vagina. You have spotting or bleeding from your vagina. You have severe abdominal cramping or pain. You have rapid weight loss or gain. You have shortness of breath with chest pain. You notice sudden or extreme swelling of your face, hands, ankles, feet, or legs. You have not felt your baby move in over an hour. You have severe headaches that do not go away with medicine. You have vision changes. Document Released: 12/02/2001 Document Revised: 12/13/2013 Document Reviewed: 02/08/2013 [facility] Patient Information 2015 [place], [place]. This information is not intended to replace advice given to you by your health care provider. Make sure you discuss any questions you have with your health care provider.

## 2024-10-20 NOTE — Progress Notes (Signed)
 US  30+2 wks,cephalic,posterior placenta gr 0,FHR 127 bpm,BPP 8/8,AFI 16 cm,mild left renal pelvis dilatation 6.3 mm,RI .63,.68,.63=56% (limited because of practice breathing)

## 2024-10-20 NOTE — Progress Notes (Signed)
 HIGH-RISK PREGNANCY VISIT Patient name: Alicia Adkins MRN [id number]  Date of birth: [birth year] Chief Complaint:   Routine Prenatal Visit  History of Present Illness:   Alicia Adkins is a 28 y.o. G71P2002 female at [redacted]w[redacted]d with an Estimated Date of Delivery: 12/27/24 being seen today for ongoing management of a high-risk pregnancy complicated by chronic hypertension currently on no meds, fetal growth restriction 7% w/ normal UAD, and PG BMI 41, +RPR s/p tx.    Today she reports no complaints. Contractions: Not present. Vag. Bleeding: None.  Movement: Present. denies leaking of fluid.      10/07/2024   11:41 AM 07/21/2024    3:21 PM 04/28/2023   10:03 AM 03/26/2021    8:51 AM 12/17/2020    2:39 PM  Depression screen PHQ 2/9  Decreased Interest 0 0 0 0 0  Down, Depressed, Hopeless 0 0 0 0 0  PHQ - 2 Score 0 0 0 0 0  Altered sleeping 1 1 1 1 1   Tired, decreased energy 1 1 1 1 1   Change in appetite 0 1 0 0 0  Feeling bad or failure about yourself  0 1 0 0 0  Trouble concentrating 0 0 0 0 0  Moving slowly or fidgety/restless 0 0 0 0 0  Suicidal thoughts 0 0 0 0 0  PHQ-9 Score 2 4 2 2 2         07/21/2024    3:21 PM 04/28/2023   10:03 AM 03/26/2021    8:51 AM 12/17/2020    2:40 PM  GAD 7 : Generalized Anxiety Score  Nervous, Anxious, on Edge 0 0 0 0  Control/stop worrying 0 0 0 0  Worry too much - different things 1 0 0 1  Trouble relaxing 1 0 0 1  Restless 0 0 0 0  Easily annoyed or irritable 1 1 0 1  Afraid - awful might happen 0 0 0 0  Total GAD 7 Score 3 1 0 3     Review of Systems:   Pertinent items are noted in HPI Denies abnormal vaginal discharge w/ itching/odor/irritation, headaches, visual changes, shortness of breath, chest pain, abdominal pain, severe nausea/vomiting, or problems with urination or bowel movements unless otherwise stated above. Pertinent History Reviewed:  Reviewed past medical,surgical, social, obstetrical and family history.  Reviewed  problem list, medications and allergies. Physical Assessment:   Vitals:   10/20/24 1508  BP: 121/78  Pulse: (!) 109  Weight: 236 lb (107 kg)  Body mass index is 41.81 kg/m.           Physical Examination:   General appearance: alert, well appearing, and in no distress  Mental status: alert, oriented to person, place, and time  Skin: warm & dry   Extremities:      Cardiovascular: normal heart rate noted  Respiratory: normal respiratory effort, no distress  Abdomen: gravid, soft, non-tender  Pelvic: Cervical exam deferred         Fetal Status:     Movement: Present    Fetal Surveillance Testing today: US  30+2 wks,cephalic,posterior placenta gr 0,FHR 127 bpm,BPP 8/8,AFI 16 cm,mild left renal pelvis dilatation 6.3 mm,RI .63,.68,.63=56% (limited because of practice breathing)   Chaperone: N/A  No results found for this or any previous visit (from the past 24 hours).  Assessment & Plan:  High-risk pregnancy: G3P2002 at [redacted]w[redacted]d with an Estimated Date of Delivery: 12/27/24   1) FGR 7% w/ normal UAD, stable, bpp 8/8  2)  CHTN, no meds, bp good, ASA  3) PGBMI 41, currently 41  4) +RPR s/p tx> titer 1:8 from 1:16  5) Wants BTL>reviewed risks/benefits, discussed high incidence regret <30yo if appropriate, LARCs just as effective, consent signed today   Meds: No orders of the defined types were placed in this encounter.   Labs/procedures today: Tdap and U/S  Treatment Plan:   UAD EFW Testing Delivery  EFW/AC 3-9% Q 1-2wk Q 3wk 28w-weekly BPP 32w-add NST 38.0-39.0    Reviewed: Preterm labor symptoms and general obstetric precautions including but not limited to vaginal bleeding, contractions, leaking of fluid and fetal movement were reviewed in detail with the patient.  All questions were answered. Does have home bp cuff. Office bp cuff given: not applicable. Check bp weekly, let us  know if consistently >140 and/or >90.  Follow-up: Return for As scheduled.   Future Appointments   Date Time Provider Department Center  10/27/2024  2:15 PM [facility] - [facility] US  [facility] None  10/27/2024  3:30 PM Kizzie Suzen SAUNDERS, CNM [facility]  11/03/2024  1:30 PM [facility] - [facility] US  [facility] None  11/03/2024  2:30 PM Kizzie Suzen SAUNDERS, CNM [facility]  11/07/2024  3:10 PM [facility] NURSE [facility]  11/10/2024  3:00 PM [facility] - [facility] US  [facility] None  11/10/2024  3:50 PM Marilynn Nest, DO [facility]  11/16/2024 10:00 AM [facility] - FT IMG 2 [facility] None  11/16/2024 10:50 AM Kizzie Suzen SAUNDERS, CNM [facility]  11/21/2024  3:10 PM [facility] NURSE [facility]  11/24/2024  2:15 PM [facility] - [facility] US  [facility] None  11/24/2024  3:10 PM Kizzie Suzen SAUNDERS, CNM [facility]  11/28/2024  3:10 PM [facility] NURSE [facility]  12/01/2024  3:00 PM [facility] - [facility] US  [facility] None  12/01/2024  3:50 PM Kizzie Suzen SAUNDERS, CNM [facility]  12/05/2024  3:10 PM [facility] NURSE [facility]  12/08/2024 10:00 AM [facility] - [facility] US  [facility] None  12/08/2024 10:50 AM Newton Mering, CNM [facility]  12/13/2024  2:15 PM [facility] - FT IMG 2 [facility] None  12/13/2024  3:10 PM Jayne Vonn DEL, MD [facility]  12/21/2024  2:15 PM [facility] - [facility] US  [facility] None  12/26/2024  3:10 PM [facility] NURSE [facility]    Orders Placed This Encounter  Procedures   Tdap vaccine greater than or equal to 7yo IM   Suzen SAUNDERS Kizzie CNM, [facility] 10/20/2024 3:34 PM

## 2024-10-26 ENCOUNTER — Other Ambulatory Visit: Payer: Self-pay | Admitting: Obstetrics & Gynecology

## 2024-10-26 DIAGNOSIS — O10919 Unspecified pre-existing hypertension complicating pregnancy, unspecified trimester: Secondary | ICD-10-CM

## 2024-10-26 DIAGNOSIS — O365990 Maternal care for other known or suspected poor fetal growth, unspecified trimester, not applicable or unspecified: Secondary | ICD-10-CM

## 2024-10-27 ENCOUNTER — Ambulatory Visit: Admitting: Women's Health

## 2024-10-27 ENCOUNTER — Other Ambulatory Visit

## 2024-10-27 ENCOUNTER — Encounter: Payer: Self-pay | Admitting: Women's Health

## 2024-10-27 ENCOUNTER — Encounter: Admitting: Women's Health

## 2024-10-27 ENCOUNTER — Ambulatory Visit (INDEPENDENT_AMBULATORY_CARE_PROVIDER_SITE_OTHER)

## 2024-10-27 VITALS — BP 118/76 | HR 111 | Wt 237.0 lb

## 2024-10-27 DIAGNOSIS — O10013 Pre-existing essential hypertension complicating pregnancy, third trimester: Secondary | ICD-10-CM

## 2024-10-27 DIAGNOSIS — O10919 Unspecified pre-existing hypertension complicating pregnancy, unspecified trimester: Secondary | ICD-10-CM

## 2024-10-27 DIAGNOSIS — O0993 Supervision of high risk pregnancy, unspecified, third trimester: Secondary | ICD-10-CM | POA: Diagnosis not present

## 2024-10-27 DIAGNOSIS — Z3A31 31 weeks gestation of pregnancy: Secondary | ICD-10-CM

## 2024-10-27 DIAGNOSIS — Z6841 Body Mass Index (BMI) 40.0 and over, adult: Secondary | ICD-10-CM | POA: Diagnosis not present

## 2024-10-27 DIAGNOSIS — O10913 Unspecified pre-existing hypertension complicating pregnancy, third trimester: Secondary | ICD-10-CM | POA: Diagnosis not present

## 2024-10-27 DIAGNOSIS — O365930 Maternal care for other known or suspected poor fetal growth, third trimester, not applicable or unspecified: Secondary | ICD-10-CM | POA: Diagnosis not present

## 2024-10-27 DIAGNOSIS — O365990 Maternal care for other known or suspected poor fetal growth, unspecified trimester, not applicable or unspecified: Secondary | ICD-10-CM

## 2024-10-27 NOTE — Progress Notes (Signed)
 HIGH-RISK PREGNANCY VISIT Patient name: Alicia Adkins MRN [id number]  Date of birth: December 20, 1996 Chief Complaint:   Routine Prenatal Visit  History of Present Illness:   Alicia Adkins is a 28 y.o. G5P2002 female at [redacted]w[redacted]d with an Estimated Date of Delivery: 12/27/24 being seen today for ongoing management of a high-risk pregnancy complicated by FGR 11% (AC 9%) w/ nl UAD, CHTN no meds, PGBMI 40, +RPR.    Today she reports no complaints. Contractions: Not present. Vag. Bleeding: None.  Movement: Present. denies leaking of fluid.      10/07/2024   11:41 AM 07/21/2024    3:21 PM 04/28/2023   10:03 AM 03/26/2021    8:51 AM 12/17/2020    2:39 PM  Depression screen PHQ 2/9  Decreased Interest 0 0 0 0 0  Down, Depressed, Hopeless 0 0 0 0 0  PHQ - 2 Score 0 0 0 0 0  Altered sleeping 1 1 1 1 1   Tired, decreased energy 1 1 1 1 1   Change in appetite 0 1 0 0 0  Feeling bad or failure about yourself  0 1 0 0 0  Trouble concentrating 0 0 0 0 0  Moving slowly or fidgety/restless 0 0 0 0 0  Suicidal thoughts 0 0 0 0 0  PHQ-9 Score 2  4  2  2  2       Data saved with a previous flowsheet row definition        07/21/2024    3:21 PM 04/28/2023   10:03 AM 03/26/2021    8:51 AM 12/17/2020    2:40 PM  GAD 7 : Generalized Anxiety Score  Nervous, Anxious, on Edge 0 0 0 0  Control/stop worrying 0 0 0 0  Worry too much - different things 1 0 0 1  Trouble relaxing 1 0 0 1  Restless 0 0 0 0  Easily annoyed or irritable 1 1 0 1  Afraid - awful might happen 0 0 0 0  Total GAD 7 Score 3 1 0 3     Review of Systems:   Pertinent items are noted in HPI Denies abnormal vaginal discharge w/ itching/odor/irritation, headaches, visual changes, shortness of breath, chest pain, abdominal pain, severe nausea/vomiting, or problems with urination or bowel movements unless otherwise stated above. Pertinent History Reviewed:  Reviewed past medical,surgical, social, obstetrical and family history.   Reviewed problem list, medications and allergies. Physical Assessment:   Vitals:   10/27/24 1447  BP: 118/76  Pulse: (!) 111  Weight: 237 lb (107.5 kg)  Body mass index is 41.98 kg/m.           Physical Examination:   General appearance: alert, well appearing, and in no distress  Mental status: alert, oriented to person, place, and time  Skin: warm & dry   Extremities:      Cardiovascular: normal heart rate noted  Respiratory: normal respiratory effort, no distress  Abdomen: gravid, soft, non-tender  Pelvic: Cervical exam deferred         Fetal Status:     Movement: Present    Fetal Surveillance Testing today: US  31+2 wks,cephalic,FHR 151 bpm,posterior placenta gr 0,AFI 18 cm,left renal pelvis 5 mm,right renal pelvis 5 mm WNL,EFW 1508 g 11%,AC 9%,RI .65,.68,.69=67%    Chaperone: N/A  No results found for this or any previous visit (from the past 24 hours).  Assessment & Plan:  High-risk pregnancy: G3P2002 at [redacted]w[redacted]d with an Estimated Date of Delivery: 12/27/24  1) FGR 11% (AC 9%) w/ nl UAD, bpp 8/8  2) CHTN, ASA, no meds, stable  3) PGMBI 40> currently 41  4) +RPR during pregnancy> s/p tx, last check down to 1:8 from 1:16  Meds: No orders of the defined types were placed in this encounter.   Labs/procedures today: U/S  Treatment Plan:   UAD EFW Testing Delivery  EFW/AC 3-9% Q 1-2wk Q 3wk 28w-weekly BPP 32w-add NST 38.0-39.0    Reviewed: Preterm labor symptoms and general obstetric precautions including but not limited to vaginal bleeding, contractions, leaking of fluid and fetal movement were reviewed in detail with the patient.  All questions were answered. Does have home bp cuff. Office bp cuff given: not applicable. Check bp weekly, let us  know if consistently >140 and/or >90.  Follow-up: Return for As scheduled.   Future Appointments  Date Time Provider Department Center  10/27/2024  3:30 PM Kizzie Suzen SAUNDERS, [place] [facility]  11/03/2024  1:30 PM [facility] -  FTOBGYN US  [facility] None  11/03/2024  2:30 PM Kizzie Suzen SAUNDERS, CNM [facility]  11/07/2024  3:10 PM [facility] NURSE [facility]  11/10/2024  3:00 PM [facility] - [facility]YN US  [facility] None  11/10/2024  3:50 PM Marilynn Nest, DO [facility]  11/16/2024 10:00 AM [facility] - FT IMG 2 [facility] None  11/16/2024 10:50 AM Kizzie Suzen SAUNDERS, CNM [facility]  11/21/2024  3:10 PM [facility] NURSE [facility]  11/24/2024  2:15 PM [facility] - [facility]YN US  [facility] None  11/24/2024  3:10 PM Kizzie Suzen SAUNDERS, CNM [facility]  11/28/2024  3:10 PM [facility] NURSE [facility]  12/01/2024  3:00 PM [facility] - [facility]YN US  [facility] None  12/01/2024  3:50 PM Kizzie Suzen SAUNDERS, CNM [facility]  12/05/2024  3:10 PM [facility] NURSE [facility]  12/08/2024 10:00 AM [facility] - F[facility]YN US  [facility] None  12/08/2024 10:50 AM Newton Mering, CNM [facility]  12/13/2024  2:15 PM [facility] - FT IMG 2 [facility] None  12/13/2024  3:10 PM Jayne Vonn DEL, MD [facility]  12/21/2024  2:15 PM [facility] - F[facility]YN US  [facility] None  12/26/2024  3:10 PM [facility] NURSE [facility]    No orders of the defined types were placed in this encounter.  Suzen SAUNDERS Kizzie CNM, [facility] 10/27/2024 3:16 PM

## 2024-10-27 NOTE — Patient Instructions (Signed)
 Alicia Adkins, thank you for choosing our office today! We appreciate the opportunity to meet your healthcare needs. You may receive a short survey by mail, e-mail, or through [employer]. If you are happy with your care we would appreciate if you could take just a few minutes to complete the survey questions. We read all of your comments and take your feedback very seriously. Thank you again for choosing our office.  Center for [employer] Team at [facility]  [facility] at [facility] ([address] [place], [place] [address]) Entrance C, located off of [address] Free 24/7 valet parking   CLASSES: Go to [email or web address] to register for classes (childbirth, breastfeeding, waterbirth, infant CPR, daddy bootcamp, etc.)  Call the office [phone]) or go to [facility] if: You begin to have strong, frequent contractions Your water breaks.  Sometimes it is a big gush of fluid, sometimes it is just a trickle that keeps getting your panties wet or running down your legs You have vaginal bleeding.  It is normal to have a small amount of spotting if your cervix was checked.  You don't feel your baby moving like normal.  If you don't, get you something to eat and drink and lay down and focus on feeling your baby move.   If your baby is still not moving like normal, you should call the office or go to [facility].  Call the office [phone]) or go to [facility] hospital for these signs of pre-eclampsia: Severe headache that does not go away with Tylenol  Visual changes- seeing spots, double, blurred vision Pain under your right breast or upper abdomen that does not go away with Tums or heartburn medicine Nausea and/or vomiting Severe swelling in your hands, feet, and face   Tdap Vaccine It is recommended that you get the Tdap vaccine during the third trimester of EACH pregnancy to help protect your baby from getting pertussis (whooping cough) 27-36 weeks is the BEST time to do  this so that you can pass the protection on to your baby. During pregnancy is better than after pregnancy, but if you are unable to get it during pregnancy it will be offered at the hospital.  You can get this vaccine with us , at the health department, your family doctor, or some local pharmacies Everyone who will be around your baby should also be up-to-date on their vaccines before the baby comes. Adults (who are not pregnant) only need 1 dose of Tdap during adulthood.   [facility] Pediatricians/Family Doctors [facility]): [address] Luba BROCKS, [phone]           [facility]: [address], [phone]                [facility] Medicine [facility]): [address], [phone] (call to ask if accepting patients) [facility] Department: [address], [place], [phone]    [facility] Pediatricians/Family Doctors [facility]): [phone], [phone] [facility]: [address] [place], [phone] [facility]: [address], [phone]  [facility] Doctors  [facility]): [phone] [facility]: [address] [phone]   [facility] Doctors [facility]: [address], [phone]  [facility] Family Doctors  [facility]: [phone], [phone]  Home Blood Pressure Monitoring for Patients   Your provider has recommended that you check your  blood pressure (BP) at least once a week at home. If you do not have a blood pressure cuff at home, one will be provided for you. Contact your provider if you have not received your monitor within 1 week.   Helpful Tips for Accurate Home Blood Pressure Checks  Don't smoke, exercise, or drink caffeine 30 minutes before checking your BP Use the restroom before checking your BP (a full bladder can raise your  pressure) Relax in a comfortable upright chair Feet on the ground Left arm resting comfortably on a flat surface at the level of your heart Legs uncrossed Back supported Sit quietly and don't talk Place the cuff on your bare arm Adjust snuggly, so that only two fingertips can fit between your skin and the top of the cuff Check 2 readings separated by at least one minute Keep a log of your BP readings For a visual, please reference this diagram: http://ccnc.care/bpdiagram  Provider Name: [facility]     Phone: [phone]  Zone 1: ALL CLEAR  Continue to monitor your symptoms:  BP reading is less than 140 (top number) or less than 90 (bottom number)  No right upper stomach pain No headaches or seeing spots No feeling nauseated or throwing up No swelling in face and hands  Zone 2: CAUTION Call your doctor's office for any of the following:  BP reading is greater than 140 (top number) or greater than 90 (bottom number)  Stomach pain under your ribs in the middle or right side Headaches or seeing spots Feeling nauseated or throwing up Swelling in face and hands  Zone 3: EMERGENCY  Seek immediate medical care if you have any of the following:  BP reading is greater than160 (top number) or greater than 110 (bottom number) Severe headaches not improving with Tylenol  Serious difficulty catching your breath Any worsening symptoms from Zone 2  Preterm Labor and Birth Information  The normal length of a pregnancy is 39-41 weeks. Preterm labor is when labor starts before 37 completed weeks of pregnancy. What are the risk factors for preterm labor? Preterm labor is more likely to occur in women who: Have certain infections during pregnancy such as a bladder infection, sexually transmitted infection, or infection inside the uterus (chorioamnionitis). Have a shorter-than-normal cervix. Have gone into preterm labor before. Have had surgery on their cervix. Are younger than age 62  or older than age 28. Are African American. Are pregnant with twins or multiple babies (multiple gestation). Take street drugs or smoke while pregnant. Do not gain enough weight while pregnant. Became pregnant shortly after having been pregnant. What are the symptoms of preterm labor? Symptoms of preterm labor include: Cramps similar to those that can happen during a menstrual period. The cramps may happen with diarrhea. Pain in the abdomen or lower back. Regular uterine contractions that may feel like tightening of the abdomen. A feeling of increased pressure in the pelvis. Increased watery or bloody mucus discharge from the vagina. Water breaking (ruptured amniotic sac). Why is it important to recognize signs of preterm labor? It is important to recognize signs of preterm labor because babies who are born prematurely may not be fully developed. This can put them at an increased risk for: Long-term (chronic) heart and lung problems. Difficulty immediately after birth with regulating body systems, including blood sugar, body temperature, heart rate, and breathing rate. Bleeding in the brain. Cerebral palsy. Learning difficulties. Death. These risks are highest for babies who are born before 34 weeks  of pregnancy. How is preterm labor treated? Treatment depends on the length of your pregnancy, your condition, and the health of your baby. It may involve: Having a stitch (suture) placed in your cervix to prevent your cervix from opening too early (cerclage). Taking or being given medicines, such as: Hormone medicines. These may be given early in pregnancy to help support the pregnancy. Medicine to stop contractions. Medicines to help mature the baby's lungs. These may be prescribed if the risk of delivery is high. Medicines to prevent your baby from developing cerebral palsy. If the labor happens before 34 weeks of pregnancy, you may need to stay in the hospital. What should I do if I  think I am in preterm labor? If you think that you are going into preterm labor, call your health care provider right away. How can I prevent preterm labor in future pregnancies? To increase your chance of having a full-term pregnancy: Do not use any tobacco products, such as cigarettes, chewing tobacco, and e-cigarettes. If you need help quitting, ask your health care provider. Do not use street drugs or medicines that have not been prescribed to you during your pregnancy. Talk with your health care provider before taking any herbal supplements, even if you have been taking them regularly. Make sure you gain a healthy amount of weight during your pregnancy. Watch for infection. If you think that you might have an infection, get it checked right away. Make sure to tell your health care provider if you have gone into preterm labor before. This information is not intended to replace advice given to you by your health care provider. Make sure you discuss any questions you have with your health care provider. Document Revised: 04/01/2019 Document Reviewed: 04/30/2016 Elsevier Patient Education  2020 [employer].

## 2024-10-27 NOTE — Progress Notes (Signed)
 US  31+2 wks,cephalic,FHR 151 bpm,posterior placenta gr 0,AFI 18 cm,left renal pelvis 5 mm,right renal pelvis 5 mm WNL,EFW 1508 g 11%,AC 9%,RI .65,.68,.69=67%

## 2024-11-02 ENCOUNTER — Other Ambulatory Visit: Payer: Self-pay | Admitting: Obstetrics & Gynecology

## 2024-11-02 DIAGNOSIS — O365990 Maternal care for other known or suspected poor fetal growth, unspecified trimester, not applicable or unspecified: Secondary | ICD-10-CM

## 2024-11-02 DIAGNOSIS — O10919 Unspecified pre-existing hypertension complicating pregnancy, unspecified trimester: Secondary | ICD-10-CM

## 2024-11-03 ENCOUNTER — Encounter: Payer: Self-pay | Admitting: Women's Health

## 2024-11-03 ENCOUNTER — Ambulatory Visit

## 2024-11-03 ENCOUNTER — Ambulatory Visit: Admitting: Women's Health

## 2024-11-03 VITALS — BP 115/76 | HR 109 | Wt 239.0 lb

## 2024-11-03 DIAGNOSIS — O365990 Maternal care for other known or suspected poor fetal growth, unspecified trimester, not applicable or unspecified: Secondary | ICD-10-CM

## 2024-11-03 DIAGNOSIS — O0993 Supervision of high risk pregnancy, unspecified, third trimester: Secondary | ICD-10-CM

## 2024-11-03 DIAGNOSIS — Z6841 Body Mass Index (BMI) 40.0 and over, adult: Secondary | ICD-10-CM

## 2024-11-03 DIAGNOSIS — O10919 Unspecified pre-existing hypertension complicating pregnancy, unspecified trimester: Secondary | ICD-10-CM

## 2024-11-03 DIAGNOSIS — O365930 Maternal care for other known or suspected poor fetal growth, third trimester, not applicable or unspecified: Secondary | ICD-10-CM

## 2024-11-03 DIAGNOSIS — O10013 Pre-existing essential hypertension complicating pregnancy, third trimester: Secondary | ICD-10-CM | POA: Diagnosis not present

## 2024-11-03 DIAGNOSIS — Z3A32 32 weeks gestation of pregnancy: Secondary | ICD-10-CM

## 2024-11-03 DIAGNOSIS — O10913 Unspecified pre-existing hypertension complicating pregnancy, third trimester: Secondary | ICD-10-CM | POA: Diagnosis not present

## 2024-11-03 NOTE — Patient Instructions (Signed)
 Lorelei, thank you for choosing our office today! We appreciate the opportunity to meet your healthcare needs. You may receive a short survey by mail, e-mail, or through [employer]. If you are happy with your care we would appreciate if you could take just a few minutes to complete the survey questions. We read all of your comments and take your feedback very seriously. Thank you again for choosing our office.  Center for [employer] Team at [facility]  [facility] ([address] [place], [place] [address]) Entrance C, located off of [address] Free 24/7 valet parking   CLASSES: Go to [email or web address] to register for classes (childbirth, breastfeeding, waterbirth, infant CPR, daddy bootcamp, etc.)  Call the office [phone]) or go to [facility] if: You begin to have strong, frequent contractions Your water breaks.  Sometimes it is a big gush of fluid, sometimes it is just a trickle that keeps getting your panties wet or running down your legs You have vaginal bleeding.  It is normal to have a small amount of spotting if your cervix was checked.  You don't feel your baby moving like normal.  If you don't, get you something to eat and drink and lay down and focus on feeling your baby move.   If your baby is still not moving like normal, you should call the office or go to [facility].  Call the office [phone]) or go to [facility] hospital for these signs of pre-eclampsia: Severe headache that does not go away with Tylenol  Visual changes- seeing spots, double, blurred vision Pain under your right breast or upper abdomen that does not go away with Tums or heartburn medicine Nausea and/or vomiting Severe swelling in your hands, feet, and face   Tdap Vaccine It is recommended that you get the Tdap vaccine during the third trimester of EACH pregnancy to help protect your baby from getting pertussis (whooping cough) 27-36 weeks is the BEST time to do  this so that you can pass the protection on to your baby. During pregnancy is better than after pregnancy, but if you are unable to get it during pregnancy it will be offered at the hospital.  You can get this vaccine with us , at the health department, your family doctor, or some local pharmacies Everyone who will be around your baby should also be up-to-date on their vaccines before the baby comes. Adults (who are not pregnant) only need 1 dose of Tdap during adulthood.   [facility] Pediatricians/Family Doctors [facility]): [address] Luba BROCKS, [phone]           [facility]: [address], [phone]                [facility] Medicine [facility]): [address], [phone] (call to ask if accepting patients) [facility] Department: [address], [place], [phone]    [facility] Pediatricians/Family Doctors [facility]): [phone], [phone] [facility]: [address] [place], [phone] [facility]: [address], [phone]  [facility] Doctors  [facility]): [phone] [facility]: [address] [phone]   [facility] Doctors [facility]: [address], [phone]  [facility] Family Doctors  [facility]: [phone], [phone]  Home Blood Pressure Monitoring for Patients   Your provider has recommended that you check your  blood pressure (BP) at least once a week at home. If you do not have a blood pressure cuff at home, one will be provided for you. Contact your provider if you have not received your monitor within 1 week.   Helpful Tips for Accurate Home Blood Pressure Checks  Don't smoke, exercise, or drink caffeine 30 minutes before checking your BP Use the restroom before checking your BP (a full bladder can raise your  pressure) Relax in a comfortable upright chair Feet on the ground Left arm resting comfortably on a flat surface at the level of your heart Legs uncrossed Back supported Sit quietly and don't talk Place the cuff on your bare arm Adjust snuggly, so that only two fingertips can fit between your skin and the top of the cuff Check 2 readings separated by at least one minute Keep a log of your BP readings For a visual, please reference this diagram: http://ccnc.care/bpdiagram  Provider Name: [facility]     Phone: [phone]  Zone 1: ALL CLEAR  Continue to monitor your symptoms:  BP reading is less than 140 (top number) or less than 90 (bottom number)  No right upper stomach pain No headaches or seeing spots No feeling nauseated or throwing up No swelling in face and hands  Zone 2: CAUTION Call your doctor's office for any of the following:  BP reading is greater than 140 (top number) or greater than 90 (bottom number)  Stomach pain under your ribs in the middle or right side Headaches or seeing spots Feeling nauseated or throwing up Swelling in face and hands  Zone 3: EMERGENCY  Seek immediate medical care if you have any of the following:  BP reading is greater than160 (top number) or greater than 110 (bottom number) Severe headaches not improving with Tylenol  Serious difficulty catching your breath Any worsening symptoms from Zone 2  Preterm Labor and Birth Information  The normal length of a pregnancy is 39-41 weeks. Preterm labor is when labor starts before 37 completed weeks of pregnancy. What are the risk factors for preterm labor? Preterm labor is more likely to occur in women who: Have certain infections during pregnancy such as a bladder infection, sexually transmitted infection, or infection inside the uterus (chorioamnionitis). Have a shorter-than-normal cervix. Have gone into preterm labor before. Have had surgery on their cervix. Are younger than age 62  or older than age 28. Are African American. Are pregnant with twins or multiple babies (multiple gestation). Take street drugs or smoke while pregnant. Do not gain enough weight while pregnant. Became pregnant shortly after having been pregnant. What are the symptoms of preterm labor? Symptoms of preterm labor include: Cramps similar to those that can happen during a menstrual period. The cramps may happen with diarrhea. Pain in the abdomen or lower back. Regular uterine contractions that may feel like tightening of the abdomen. A feeling of increased pressure in the pelvis. Increased watery or bloody mucus discharge from the vagina. Water breaking (ruptured amniotic sac). Why is it important to recognize signs of preterm labor? It is important to recognize signs of preterm labor because babies who are born prematurely may not be fully developed. This can put them at an increased risk for: Long-term (chronic) heart and lung problems. Difficulty immediately after birth with regulating body systems, including blood sugar, body temperature, heart rate, and breathing rate. Bleeding in the brain. Cerebral palsy. Learning difficulties. Death. These risks are highest for babies who are born before 34 weeks  of pregnancy. How is preterm labor treated? Treatment depends on the length of your pregnancy, your condition, and the health of your baby. It may involve: Having a stitch (suture) placed in your cervix to prevent your cervix from opening too early (cerclage). Taking or being given medicines, such as: Hormone medicines. These may be given early in pregnancy to help support the pregnancy. Medicine to stop contractions. Medicines to help mature the baby's lungs. These may be prescribed if the risk of delivery is high. Medicines to prevent your baby from developing cerebral palsy. If the labor happens before 34 weeks of pregnancy, you may need to stay in the hospital. What should I do if I  think I am in preterm labor? If you think that you are going into preterm labor, call your health care provider right away. How can I prevent preterm labor in future pregnancies? To increase your chance of having a full-term pregnancy: Do not use any tobacco products, such as cigarettes, chewing tobacco, and e-cigarettes. If you need help quitting, ask your health care provider. Do not use street drugs or medicines that have not been prescribed to you during your pregnancy. Talk with your health care provider before taking any herbal supplements, even if you have been taking them regularly. Make sure you gain a healthy amount of weight during your pregnancy. Watch for infection. If you think that you might have an infection, get it checked right away. Make sure to tell your health care provider if you have gone into preterm labor before. This information is not intended to replace advice given to you by your health care provider. Make sure you discuss any questions you have with your health care provider. Document Revised: 04/01/2019 Document Reviewed: 04/30/2016 Elsevier Patient Education  2020 [employer].

## 2024-11-03 NOTE — Progress Notes (Signed)
 HIGH-RISK PREGNANCY VISIT Patient name: Alicia Adkins MRN [id number]  Date of birth: 1996/03/12 Chief Complaint:   Routine Prenatal Visit and Pregnancy Ultrasound  History of Present Illness:   Alicia Adkins is a 28 y.o. G42P2002 female at [redacted]w[redacted]d with an Estimated Date of Delivery: 12/27/24 being seen today for ongoing management of a high-risk pregnancy complicated by FGR 11% (AC 9%) w/ nl UAD, CHTN no meds, PGBMI 40, +RPR   Today she reports no complaints. Contractions: Not present.  .  Movement: Present. denies leaking of fluid.      10/07/2024   11:41 AM 07/21/2024    3:21 PM 04/28/2023   10:03 AM 03/26/2021    8:51 AM 12/17/2020    2:39 PM  Depression screen PHQ 2/9  Decreased Interest 0 0 0 0 0  Down, Depressed, Hopeless 0 0 0 0 0  PHQ - 2 Score 0 0 0 0 0  Altered sleeping 1 1 1 1 1   Tired, decreased energy 1 1 1 1 1   Change in appetite 0 1 0 0 0  Feeling bad or failure about yourself  0 1 0 0 0  Trouble concentrating 0 0 0 0 0  Moving slowly or fidgety/restless 0 0 0 0 0  Suicidal thoughts 0 0 0 0 0  PHQ-9 Score 2  4  2  2  2       Data saved with a previous flowsheet row definition        07/21/2024    3:21 PM 04/28/2023   10:03 AM 03/26/2021    8:51 AM 12/17/2020    2:40 PM  GAD 7 : Generalized Anxiety Score  Nervous, Anxious, on Edge 0 0 0 0  Control/stop worrying 0 0 0 0  Worry too much - different things 1 0 0 1  Trouble relaxing 1 0 0 1  Restless 0 0 0 0  Easily annoyed or irritable 1 1 0 1  Afraid - awful might happen 0 0 0 0  Total GAD 7 Score 3 1 0 3     Review of Systems:   Pertinent items are noted in HPI Denies abnormal vaginal discharge w/ itching/odor/irritation, headaches, visual changes, shortness of breath, chest pain, abdominal pain, severe nausea/vomiting, or problems with urination or bowel movements unless otherwise stated above. Pertinent History Reviewed:  Reviewed past medical,surgical, social, obstetrical and family history.   Reviewed problem list, medications and allergies. Physical Assessment:   Vitals:   11/03/24 1426  BP: 115/76  Pulse: (!) 109  Weight: 239 lb (108.4 kg)  Body mass index is 42.34 kg/m.           Physical Examination:   General appearance: alert, well appearing, and in no distress  Mental status: alert, oriented to person, place, and time  Skin: warm & dry   Extremities: Edema: None    Cardiovascular: normal heart rate noted  Respiratory: normal respiratory effort, no distress  Abdomen: gravid, soft, non-tender  Pelvic: Cervical exam deferred         Fetal Status:     Movement: Present    Fetal Surveillance Testing today: US  32+2 wks,cephalic,BPP 8/8,FHR 151 bpm,mild hydronephrosis with dilated ureter,left renal pelvis 6.3 mm,normal right kidney,posterior placenta gr 0,AFI 13 cm,RI .69,.67,.69=83%   Chaperone: N/A  No results found for this or any previous visit (from the past 24 hours).  Assessment & Plan:  High-risk pregnancy: G3P2002 at [redacted]w[redacted]d with an Estimated Date of Delivery: 12/27/24   1)  FGR 11% (AC 9%) w/ nl UAD, bpp 8/8   2) CHTN, ASA, no meds, stable   3) PGMBI 40> currently 41   4) +RPR during pregnancy> s/p tx, last check down to 1:8 from 1:16  Meds: No orders of the defined types were placed in this encounter.  Labs/procedures today: U/S  Treatment Plan:    UAD EFW Testing Delivery  EFW/AC 3-9% Q 1-2wk Q 3wk 28w-weekly BPP 32w-add NST 38.0-39.0    Reviewed: Preterm labor symptoms and general obstetric precautions including but not limited to vaginal bleeding, contractions, leaking of fluid and fetal movement were reviewed in detail with the patient.  All questions were answered. Does have home bp cuff. Office bp cuff given: not applicable. Check bp weekly, let us  know if consistently >140 and/or >90.  Follow-up: Return for As scheduled.   Future Appointments  Date Time Provider Department Center  11/07/2024  3:10 PM [facility] NURSE [facility]   11/10/2024  3:00 PM [facility] US  [facility] None  11/10/2024  3:50 PM Marilynn Nest, DO [facility]  11/16/2024 10:00 AM [facility] IMG 2 [facility] None  11/16/2024 10:50 AM Kizzie Suzen SAUNDERS, CNM [facility]  11/21/2024  3:10 PM [facility] NURSE [facility]  11/24/2024  2:15 PM [facility] US  [facility] None  11/24/2024  3:10 PM Kizzie Suzen SAUNDERS, CNM [facility]  11/28/2024  3:10 PM [facility] NURSE [facility]  12/01/2024  3:00 PM [facility] US  [facility] None  12/01/2024  3:50 PM Kizzie Suzen SAUNDERS, CNM [facility]  12/05/2024  3:10 PM [facility] NURSE [facility]  12/08/2024 10:00 AM [facility] US  [facility] None  12/08/2024 10:50 AM Newton Mering, CNM [facility]  12/13/2024  1:30 PM [facility] IMG 2 [facility] None  12/13/2024  2:30 PM Jayne Vonn DEL, MD [facility]  12/21/2024  2:15 PM [facility] US  [facility] None  12/26/2024  3:10 PM [facility] NURSE [facility]    No orders of the defined types were placed in this encounter.  Suzen SAUNDERS Kizzie CNM, [facility] 11/03/2024 2:51 PM

## 2024-11-03 NOTE — Progress Notes (Signed)
 US  32+2 wks,cephalic,BPP 8/8,FHR 151 bpm,mild hydronephrosis with dilated ureter,left renal pelvis 6.3 mm,normal right kidney,posterior placenta gr 0,AFI 13 cm,RI .69,.67,.69=83%

## 2024-11-07 ENCOUNTER — Encounter: Payer: Self-pay | Admitting: Obstetrics & Gynecology

## 2024-11-07 ENCOUNTER — Ambulatory Visit: Admitting: *Deleted

## 2024-11-07 VITALS — BP 138/83 | HR 113 | Wt 244.0 lb

## 2024-11-07 DIAGNOSIS — Z6841 Body Mass Index (BMI) 40.0 and over, adult: Secondary | ICD-10-CM | POA: Diagnosis not present

## 2024-11-07 DIAGNOSIS — O365930 Maternal care for other known or suspected poor fetal growth, third trimester, not applicable or unspecified: Secondary | ICD-10-CM | POA: Diagnosis not present

## 2024-11-07 DIAGNOSIS — Z3A32 32 weeks gestation of pregnancy: Secondary | ICD-10-CM

## 2024-11-07 DIAGNOSIS — O365990 Maternal care for other known or suspected poor fetal growth, unspecified trimester, not applicable or unspecified: Secondary | ICD-10-CM

## 2024-11-07 DIAGNOSIS — O10913 Unspecified pre-existing hypertension complicating pregnancy, third trimester: Secondary | ICD-10-CM | POA: Diagnosis not present

## 2024-11-07 DIAGNOSIS — O10919 Unspecified pre-existing hypertension complicating pregnancy, unspecified trimester: Secondary | ICD-10-CM

## 2024-11-07 DIAGNOSIS — O0993 Supervision of high risk pregnancy, unspecified, third trimester: Secondary | ICD-10-CM

## 2024-11-07 NOTE — Progress Notes (Signed)
   NURSE VISIT- NST  SUBJECTIVE:  Alicia Adkins is a 28 y.o. G34P2002 female at [redacted]w[redacted]d, here for a NST for pregnancy complicated by [facility], FGR, and Morbid obesity (BMI >=40).  She reports active fetal movement, contractions: none, vaginal bleeding: none, membranes: intact.   OBJECTIVE:  BP 138/83   Pulse (!) 113   Wt 244 lb (110.7 kg)   LMP 03/22/2024   BMI 43.22 kg/m   Appears well, no apparent distress  No results found for this or any previous visit (from the past 24 hours).  NST: FHR baseline 135 bpm, Variability: moderate, Accelerations:present, Decelerations:  Absent= Cat 1/reactive Toco: none   ASSESSMENT: G3P2002 at [redacted]w[redacted]d with CHTN, FGR, and Morbid obesity (BMI >=40) NST reactive  PLAN: EFM strip reviewed by Dr. Jayne   Recommendations: keep next appointment as scheduled    Alan LITTIE Fischer  11/07/2024 3:24 PM

## 2024-11-09 ENCOUNTER — Other Ambulatory Visit: Payer: Self-pay | Admitting: Obstetrics & Gynecology

## 2024-11-09 DIAGNOSIS — O10919 Unspecified pre-existing hypertension complicating pregnancy, unspecified trimester: Secondary | ICD-10-CM

## 2024-11-09 DIAGNOSIS — O365990 Maternal care for other known or suspected poor fetal growth, unspecified trimester, not applicable or unspecified: Secondary | ICD-10-CM

## 2024-11-10 ENCOUNTER — Encounter: Payer: Self-pay | Admitting: Obstetrics & Gynecology

## 2024-11-10 ENCOUNTER — Other Ambulatory Visit

## 2024-11-10 ENCOUNTER — Ambulatory Visit (INDEPENDENT_AMBULATORY_CARE_PROVIDER_SITE_OTHER): Admitting: Obstetrics & Gynecology

## 2024-11-10 VITALS — BP 111/73 | HR 93 | Wt 241.6 lb

## 2024-11-10 DIAGNOSIS — Z3A32 32 weeks gestation of pregnancy: Secondary | ICD-10-CM | POA: Diagnosis not present

## 2024-11-10 DIAGNOSIS — O0993 Supervision of high risk pregnancy, unspecified, third trimester: Secondary | ICD-10-CM

## 2024-11-10 DIAGNOSIS — O365990 Maternal care for other known or suspected poor fetal growth, unspecified trimester, not applicable or unspecified: Secondary | ICD-10-CM

## 2024-11-10 DIAGNOSIS — O35EXX0 Maternal care for other (suspected) fetal abnormality and damage, fetal genitourinary anomalies, not applicable or unspecified: Secondary | ICD-10-CM

## 2024-11-10 DIAGNOSIS — O99213 Obesity complicating pregnancy, third trimester: Secondary | ICD-10-CM

## 2024-11-10 DIAGNOSIS — E6609 Other obesity due to excess calories: Secondary | ICD-10-CM | POA: Diagnosis not present

## 2024-11-10 DIAGNOSIS — Z3A33 33 weeks gestation of pregnancy: Secondary | ICD-10-CM | POA: Diagnosis not present

## 2024-11-10 DIAGNOSIS — O99210 Obesity complicating pregnancy, unspecified trimester: Secondary | ICD-10-CM

## 2024-11-10 DIAGNOSIS — O365930 Maternal care for other known or suspected poor fetal growth, third trimester, not applicable or unspecified: Secondary | ICD-10-CM

## 2024-11-10 DIAGNOSIS — O10013 Pre-existing essential hypertension complicating pregnancy, third trimester: Secondary | ICD-10-CM

## 2024-11-10 DIAGNOSIS — A539 Syphilis, unspecified: Secondary | ICD-10-CM | POA: Diagnosis not present

## 2024-11-10 DIAGNOSIS — O10919 Unspecified pre-existing hypertension complicating pregnancy, unspecified trimester: Secondary | ICD-10-CM

## 2024-11-10 NOTE — Progress Notes (Addendum)
 US  33+2 wks,cephalic,BPP 8/8,FHR 140 bpm,posterior placenta gr 1,mild left hydronephrosis with dilated ureter n/c,AFI 17 cm,RI .70,.72,.68=88%

## 2024-11-10 NOTE — Progress Notes (Signed)
 HIGH-RISK PREGNANCY VISIT Patient name: Alicia Adkins MRN [id number]  Date of birth: 05/11/1996 Chief Complaint:   Routine Prenatal Visit  History of Present Illness:   Alicia Adkins is a 28 y.o. G62P2002 female at [redacted]w[redacted]d with an Estimated Date of Delivery: 12/27/24 being seen today for ongoing management of a high-risk pregnancy complicated by:  -FGR -Chronic HTN  no meds -Syphilis Treated per health department, last titers 10/17 with appropriate decline in titers -Anemia -Obesity  Today she reports no complaints.   Contractions: Not present. Vag. Bleeding: None.  Movement: Present. denies leaking of fluid.      10/07/2024   11:41 AM 07/21/2024    3:21 PM 04/28/2023   10:03 AM 03/26/2021    8:51 AM 12/17/2020    2:39 PM  Depression screen PHQ 2/9  Decreased Interest 0 0 0 0 0  Down, Depressed, Hopeless 0 0 0 0 0  PHQ - 2 Score 0 0 0 0 0  Altered sleeping 1 1 1 1 1   Tired, decreased energy 1 1 1 1 1   Change in appetite 0 1 0 0 0  Feeling bad or failure about yourself  0 1 0 0 0  Trouble concentrating 0 0 0 0 0  Moving slowly or fidgety/restless 0 0 0 0 0  Suicidal thoughts 0 0 0 0 0  PHQ-9 Score 2  4  2  2  2       Data saved with a previous flowsheet row definition     Current Outpatient Medications  Medication Instructions   aspirin  EC 81 mg, Oral, Daily   Blood Pressure Monitor MISC For regular home bp monitoring during pregnancy   ferrous sulfate  325 mg, Oral, Every other day   lidocaine  (XYLOCAINE ) 5 % ointment Apply to external affected area as needed   ondansetron  (ZOFRAN -ODT) 4 mg, Oral, Every 6 hours   prenatal vitamin w/FE, FA (PRENATAL 1 + 1) 27-1 MG TABS tablet Take 1 tablet daily by mouth     Review of Systems:   Pertinent items are noted in HPI Denies abnormal vaginal discharge w/ itching/odor/irritation, headaches, visual changes, shortness of breath, chest pain, abdominal pain, severe nausea/vomiting, or problems with urination or bowel  movements unless otherwise stated above. Pertinent History Reviewed:  Reviewed past medical,surgical, social, obstetrical and family history.  Reviewed problem list, medications and allergies. Physical Assessment:   Vitals:   11/10/24 1537  BP: 111/73  Pulse: 93  Weight: 241 lb 9.6 oz (109.6 kg)  Body mass index is 42.8 kg/m.           Physical Examination:   General appearance: alert, well appearing, and in no distress  Mental status: normal mood, behavior, speech, dress, motor activity, and thought processes  Skin: warm & dry   Extremities:      Cardiovascular: normal heart rate noted  Respiratory: normal respiratory effort, no distress  Abdomen: gravid, soft, non-tender  Pelvic: Cervical exam deferred         Fetal Status:     Movement: Present    Fetal Surveillance Testing today: cephalic,BPP 8/8,FHR 140 bpm,posterior placenta gr 1,mild left hydronephrosis with dilated ureter n/c,AFI 17 cm,RI .70,.72,.68=88%    Chaperone: N/A    No results found for this or any previous visit (from the past 24 hours).   Assessment & Plan:  High-risk pregnancy: G3P2002 at [redacted]w[redacted]d with an Estimated Date of Delivery: 12/27/24   #FGR - Normal antepartum testing today - Continue twice-weekly testing -  Reviewed plan for IOL, current goal 38 to 39 weeks #Chronic HTN  no meds -Stable #Syphilis Treated per health department, last titers 10/17 with appropriate decline in titers #Anemia -Taking oral iron #Obesity  Meds: No orders of the defined types were placed in this encounter.   Labs/procedures today: BPP  Treatment Plan: Routine OB care and as outlined above  Reviewed: Preterm labor symptoms and general obstetric precautions including but not limited to vaginal bleeding, contractions, leaking of fluid and fetal movement were reviewed in detail with the patient.  All questions were answered.    Follow-up: Return for twice weekly as scheduled/HROB.   Future Appointments  Date Time  Provider Department Center  11/16/2024 10:00 AM [facility] - FT IMG 2 [facility] None  11/16/2024 10:50 AM Kizzie Suzen SAUNDERS, CNM [facility]FT [facility]YN  11/21/2024  3:10 PM [facility]FT[facility]GY[facility] NURSE [facility]  11/24/2024  2:15 PM [facility] - FTOBGYN US  [facility]-FTIMG None  11/24/2024  3:10 PM Kizzie Suzen SAUNDERS, CNM [facility]  11/28/2024  3:10 PM [facility]FT[facility] NURSE [facility]  12/01/2024  3:00 PM [facility] - FTOBGYN US  [facility]-FTIMG None  12/01/2024  3:50 PM Kizzie Suzen SAUNDERS, CNM [facility]FT [facility]  12/05/2024  3:10 PM [facility]FT[facility]GY[facility] NURSE [facility]  12/08/2024 10:00 AM [facility] - FTOBGYN US  [facility]-FTIMG None  12/08/2024 10:50 AM Newton Mering, CNM [facility]TO[facility]  12/13/2024  1:30 PM [facility] - FT IMG 2 [facility]FTIMG None  12/13/2024  2:30 PM Jayne Vonn DEL, MD [facility]FT [facility]TO[facility]  12/21/2024  2:15 PM [facility] - FTOBGYN US  [facility]-FTIMG None  12/26/2024  3:10 PM [facility]FTOBGY[facility] NURSE [facility]FT [facility]TO[facility]    No orders of the defined types were placed in this encounter.   Cherelle Midkiff, DO Attending Obstetrician & Gynecologist, [facility] for [employer], [facility]

## 2024-11-14 ENCOUNTER — Other Ambulatory Visit: Payer: Self-pay | Admitting: Obstetrics & Gynecology

## 2024-11-14 DIAGNOSIS — O98119 Syphilis complicating pregnancy, unspecified trimester: Secondary | ICD-10-CM

## 2024-11-14 DIAGNOSIS — O365990 Maternal care for other known or suspected poor fetal growth, unspecified trimester, not applicable or unspecified: Secondary | ICD-10-CM

## 2024-11-14 DIAGNOSIS — Z3A34 34 weeks gestation of pregnancy: Secondary | ICD-10-CM

## 2024-11-14 DIAGNOSIS — O0992 Supervision of high risk pregnancy, unspecified, second trimester: Secondary | ICD-10-CM

## 2024-11-14 DIAGNOSIS — Z6841 Body Mass Index (BMI) 40.0 and over, adult: Secondary | ICD-10-CM

## 2024-11-14 DIAGNOSIS — O10919 Unspecified pre-existing hypertension complicating pregnancy, unspecified trimester: Secondary | ICD-10-CM

## 2024-11-14 DIAGNOSIS — A539 Syphilis, unspecified: Secondary | ICD-10-CM

## 2024-11-16 ENCOUNTER — Ambulatory Visit: Admitting: Women's Health

## 2024-11-16 ENCOUNTER — Encounter: Payer: Self-pay | Admitting: Women's Health

## 2024-11-16 ENCOUNTER — Ambulatory Visit (INDEPENDENT_AMBULATORY_CARE_PROVIDER_SITE_OTHER): Admitting: Radiology

## 2024-11-16 VITALS — BP 121/78 | HR 106 | Wt 240.0 lb

## 2024-11-16 DIAGNOSIS — O99013 Anemia complicating pregnancy, third trimester: Secondary | ICD-10-CM

## 2024-11-16 DIAGNOSIS — Z6841 Body Mass Index (BMI) 40.0 and over, adult: Secondary | ICD-10-CM

## 2024-11-16 DIAGNOSIS — O365990 Maternal care for other known or suspected poor fetal growth, unspecified trimester, not applicable or unspecified: Secondary | ICD-10-CM

## 2024-11-16 DIAGNOSIS — O0992 Supervision of high risk pregnancy, unspecified, second trimester: Secondary | ICD-10-CM

## 2024-11-16 DIAGNOSIS — O10919 Unspecified pre-existing hypertension complicating pregnancy, unspecified trimester: Secondary | ICD-10-CM

## 2024-11-16 DIAGNOSIS — Z3A34 34 weeks gestation of pregnancy: Secondary | ICD-10-CM | POA: Diagnosis not present

## 2024-11-16 DIAGNOSIS — O0993 Supervision of high risk pregnancy, unspecified, third trimester: Secondary | ICD-10-CM | POA: Diagnosis not present

## 2024-11-16 DIAGNOSIS — O10913 Unspecified pre-existing hypertension complicating pregnancy, third trimester: Secondary | ICD-10-CM | POA: Diagnosis not present

## 2024-11-16 DIAGNOSIS — O365930 Maternal care for other known or suspected poor fetal growth, third trimester, not applicable or unspecified: Secondary | ICD-10-CM

## 2024-11-16 LAB — POCT URINALYSIS DIPSTICK OB
Blood, UA: NEGATIVE
Glucose, UA: NEGATIVE
Ketones, UA: NEGATIVE
Nitrite, UA: NEGATIVE
POC,PROTEIN,UA: NEGATIVE

## 2024-11-16 LAB — POCT HEMOGLOBIN: Hemoglobin: 9.4 g/dL — AB (ref 11–14.6)

## 2024-11-16 NOTE — Progress Notes (Signed)
 HIGH-RISK PREGNANCY VISIT Patient name: Alicia Adkins MRN [id number]  Date of birth: 03-25-1996 Chief Complaint:   High Risk Gestation (US  today!!!)  History of Present Illness:   Alicia Adkins is a 28 y.o. G56P2002 female at [redacted]w[redacted]d with an Estimated Date of Delivery: 12/27/24 being seen today for ongoing management of a high-risk pregnancy complicated by resolved FGR (up to 22% from 7%) w/ normal UAD, CHTN no meds, PGBMI 40, +RPR.    Today she reports taking po Fe as directed. Needs note w/ restrictions/light duty at [employer]. Contractions: Irritability. Vag. Bleeding: None.  Movement: Present. denies leaking of fluid.      10/07/2024   11:41 AM 07/21/2024    3:21 PM 04/28/2023   10:03 AM 03/26/2021    8:51 AM 12/17/2020    2:39 PM  Depression screen PHQ 2/9  Decreased Interest 0 0 0 0 0  Down, Depressed, Hopeless 0 0 0 0 0  PHQ - 2 Score 0 0 0 0 0  Altered sleeping 1 1 1 1 1   Tired, decreased energy 1 1 1 1 1   Change in appetite 0 1 0 0 0  Feeling bad or failure about yourself  0 1 0 0 0  Trouble concentrating 0 0 0 0 0  Moving slowly or fidgety/restless 0 0 0 0 0  Suicidal thoughts 0 0 0 0 0  PHQ-9 Score 2  4  2  2  2       Data saved with a previous flowsheet row definition        07/21/2024    3:21 PM 04/28/2023   10:03 AM 03/26/2021    8:51 AM 12/17/2020    2:40 PM  GAD 7 : Generalized Anxiety Score  Nervous, Anxious, on Edge 0 0 0 0  Control/stop worrying 0 0 0 0  Worry too much - different things 1 0 0 1  Trouble relaxing 1 0 0 1  Restless 0 0 0 0  Easily annoyed or irritable 1 1 0 1  Afraid - awful might happen 0 0 0 0  Total GAD 7 Score 3 1 0 3     Review of Systems:   Pertinent items are noted in HPI Denies abnormal vaginal discharge w/ itching/odor/irritation, headaches, visual changes, shortness of breath, chest pain, abdominal pain, severe nausea/vomiting, or problems with urination or bowel movements unless otherwise stated above. Pertinent  History Reviewed:  Reviewed past medical,surgical, social, obstetrical and family history.  Reviewed problem list, medications and allergies. Physical Assessment:   Vitals:   11/16/24 1053  BP: 121/78  Pulse: (!) 106  Weight: 240 lb (108.9 kg)  Body mass index is 42.51 kg/m.           Physical Examination:   General appearance: alert, well appearing, and in no distress  Mental status: alert, oriented to person, place, and time  Skin: warm & dry   Extremities: Edema: None    Cardiovascular: normal heart rate noted  Respiratory: normal respiratory effort, no distress  Abdomen: gravid, soft, non-tender  Pelvic: Cervical exam deferred         Fetal Status:     Movement: Present    Fetal Surveillance Testing today: US : GA = 34+1 weeks Single active female fetus, cephalic, FHR = 130 bpm, fundal posterior pl, gr1, AFI = 14.9 cm, MVP = 3.8 cm, BPP = 8/8, RI: 0.61, 0.61,   59%,  EFW 2166 g, 22%, AC 18%, cervix closed, nl ov's  Chaperone: N/A  Results for orders placed or performed in visit on 11/16/24 (from the past 24 hours)  POCT hemoglobin   Collection Time: 11/16/24 10:53 AM  Result Value Ref Range   Hemoglobin 9.4 (A) 11 - 14.6 g/dL  POC Urinalysis Dipstick OB   Collection Time: 11/16/24 11:26 AM  Result Value Ref Range   Color, UA     Clarity, UA     Glucose, UA Negative Negative   Bilirubin, UA     Ketones, UA neg    Spec Grav, UA     Blood, UA neg    pH, UA     POC,PROTEIN,UA Negative Negative, Trace, Small (1+), Moderate (2+), Large (3+), 4+   Urobilinogen, UA     Nitrite, UA neg    Leukocytes, UA Trace (A) Negative   Appearance     Odor      Assessment & Plan:  High-risk pregnancy: G3P2002 at [redacted]w[redacted]d with an Estimated Date of Delivery: 12/27/24   1) Resolved FGR, EFW up to 22% (from 7%) and AC up to 18% (from 4%) w/ normal UAD 59% and bpp 8/8  2) CHTN, stable, no meds, ASA, checking bp's every other day, let us  know if >140/90 or pre-e sx, unable to void,  will try again  3) PGBMI 40> currently 42  4) +RPR during pregnancy> s/p tx, last check down to 1:8 from 1:16, plan repeat 8wks from last  5) Anemia> taking po Fe as directed, fingerstick 9.4, will check CBC, discussed possibility of IV Fe, is ok w/ this if needed  Meds: No orders of the defined types were placed in this encounter.   Labs/procedures today: U/S, fingerstick hgb, and CBC  Treatment Plan:  continue weekly bpp (d/t PGBMI 40), EFW q4w  Reviewed: Preterm labor symptoms and general obstetric precautions including but not limited to vaginal bleeding, contractions, leaking of fluid and fetal movement were reviewed in detail with the patient.  All questions were answered. Does have home bp cuff. Office bp cuff given: not applicable.   Follow-up: Return for can stop nurse nst's for now; keep everything else as scheduled; print letter.   Future Appointments  Date Time Provider Department Center  11/21/2024  3:10 PM [facility] NURSE [facility]  11/24/2024  2:15 PM [facility] - FTO[facility]YN US  [facility]FTIMG None  11/24/2024  3:10 PM Kizzie Suzen SAUNDERS, CNM [facility]  11/28/2024  3:10 PM [facility] NURSE [facility]  12/01/2024  3:00 PM [facility] - [facility]TO[facility]YN US  [facility]FTIMG None  12/01/2024  3:50 PM Kizzie Suzen SAUNDERS, CNM [facility]  12/05/2024  3:10 PM [facility] NURSE [facility]  12/08/2024 10:00 AM [facility] - [facility]TO[facility] US  [facility]FTIMG None  12/08/2024 10:50 AM Newton Mering, CNM [facility]  12/13/2024  1:30 PM [facility] - FT IMG 2 [facility] None  12/13/2024  2:30 PM Jayne Vonn DEL, MD [facility]  12/21/2024  2:15 PM [facility] - [facility] US  [facility]FT[facility]G None  12/26/2024  3:10 PM [facility] NURSE [facility]    Orders Placed This Encounter  Procedures   CBC   POCT hemoglobin   POC Urinalysis Dipstick OB   Suzen SAUNDERS Kizzie CNM, [facility] 11/16/2024 11:29 AM

## 2024-11-16 NOTE — Patient Instructions (Signed)
 Lorelei, thank you for choosing our office today! We appreciate the opportunity to meet your healthcare needs. You may receive a short survey by mail, e-mail, or through [employer]. If you are happy with your care we would appreciate if you could take just a few minutes to complete the survey questions. We read all of your comments and take your feedback very seriously. Thank you again for choosing our office.  Center for [employer] Team at [facility]  [facility] ([address] [place], [place] [address]) Entrance C, located off of [address] Free 24/7 valet parking   CLASSES: Go to [email or web address] to register for classes (childbirth, breastfeeding, waterbirth, infant CPR, daddy bootcamp, etc.)  Call the office [phone]) or go to [facility] if: You begin to have strong, frequent contractions Your water breaks.  Sometimes it is a big gush of fluid, sometimes it is just a trickle that keeps getting your panties wet or running down your legs You have vaginal bleeding.  It is normal to have a small amount of spotting if your cervix was checked.  You don't feel your baby moving like normal.  If you don't, get you something to eat and drink and lay down and focus on feeling your baby move.   If your baby is still not moving like normal, you should call the office or go to [facility].  Call the office [phone]) or go to [facility] hospital for these signs of pre-eclampsia: Severe headache that does not go away with Tylenol  Visual changes- seeing spots, double, blurred vision Pain under your right breast or upper abdomen that does not go away with Tums or heartburn medicine Nausea and/or vomiting Severe swelling in your hands, feet, and face   Tdap Vaccine It is recommended that you get the Tdap vaccine during the third trimester of EACH pregnancy to help protect your baby from getting pertussis (whooping cough) 27-36 weeks is the BEST time to do  this so that you can pass the protection on to your baby. During pregnancy is better than after pregnancy, but if you are unable to get it during pregnancy it will be offered at the hospital.  You can get this vaccine with us , at the health department, your family doctor, or some local pharmacies Everyone who will be around your baby should also be up-to-date on their vaccines before the baby comes. Adults (who are not pregnant) only need 1 dose of Tdap during adulthood.   [facility] Pediatricians/Family Doctors [facility]): [address] Luba BROCKS, [phone]           [facility]: [address], [phone]                [facility] Medicine [facility]): [address], [phone] (call to ask if accepting patients) [facility] Department: [address], [place], [phone]    [facility] Pediatricians/Family Doctors [facility]): [phone], [phone] [facility]: [address] [place], [phone] [facility]: [address], [phone]  [facility] Doctors  [facility]): [phone] [facility]: [address] [phone]   [facility] Doctors [facility]: [address], [phone]  [facility] Family Doctors  [facility]: [phone], [phone]  Home Blood Pressure Monitoring for Patients   Your provider has recommended that you check your  blood pressure (BP) at least once a week at home. If you do not have a blood pressure cuff at home, one will be provided for you. Contact your provider if you have not received your monitor within 1 week.   Helpful Tips for Accurate Home Blood Pressure Checks  Don't smoke, exercise, or drink caffeine 30 minutes before checking your BP Use the restroom before checking your BP (a full bladder can raise your  pressure) Relax in a comfortable upright chair Feet on the ground Left arm resting comfortably on a flat surface at the level of your heart Legs uncrossed Back supported Sit quietly and don't talk Place the cuff on your bare arm Adjust snuggly, so that only two fingertips can fit between your skin and the top of the cuff Check 2 readings separated by at least one minute Keep a log of your BP readings For a visual, please reference this diagram: http://ccnc.care/bpdiagram  Provider Name: [facility]     Phone: [phone]  Zone 1: ALL CLEAR  Continue to monitor your symptoms:  BP reading is less than 140 (top number) or less than 90 (bottom number)  No right upper stomach pain No headaches or seeing spots No feeling nauseated or throwing up No swelling in face and hands  Zone 2: CAUTION Call your doctor's office for any of the following:  BP reading is greater than 140 (top number) or greater than 90 (bottom number)  Stomach pain under your ribs in the middle or right side Headaches or seeing spots Feeling nauseated or throwing up Swelling in face and hands  Zone 3: EMERGENCY  Seek immediate medical care if you have any of the following:  BP reading is greater than160 (top number) or greater than 110 (bottom number) Severe headaches not improving with Tylenol  Serious difficulty catching your breath Any worsening symptoms from Zone 2  Preterm Labor and Birth Information  The normal length of a pregnancy is 39-41 weeks. Preterm labor is when labor starts before 37 completed weeks of pregnancy. What are the risk factors for preterm labor? Preterm labor is more likely to occur in women who: Have certain infections during pregnancy such as a bladder infection, sexually transmitted infection, or infection inside the uterus (chorioamnionitis). Have a shorter-than-normal cervix. Have gone into preterm labor before. Have had surgery on their cervix. Are younger than age 62  or older than age 28. Are African American. Are pregnant with twins or multiple babies (multiple gestation). Take street drugs or smoke while pregnant. Do not gain enough weight while pregnant. Became pregnant shortly after having been pregnant. What are the symptoms of preterm labor? Symptoms of preterm labor include: Cramps similar to those that can happen during a menstrual period. The cramps may happen with diarrhea. Pain in the abdomen or lower back. Regular uterine contractions that may feel like tightening of the abdomen. A feeling of increased pressure in the pelvis. Increased watery or bloody mucus discharge from the vagina. Water breaking (ruptured amniotic sac). Why is it important to recognize signs of preterm labor? It is important to recognize signs of preterm labor because babies who are born prematurely may not be fully developed. This can put them at an increased risk for: Long-term (chronic) heart and lung problems. Difficulty immediately after birth with regulating body systems, including blood sugar, body temperature, heart rate, and breathing rate. Bleeding in the brain. Cerebral palsy. Learning difficulties. Death. These risks are highest for babies who are born before 34 weeks  of pregnancy. How is preterm labor treated? Treatment depends on the length of your pregnancy, your condition, and the health of your baby. It may involve: Having a stitch (suture) placed in your cervix to prevent your cervix from opening too early (cerclage). Taking or being given medicines, such as: Hormone medicines. These may be given early in pregnancy to help support the pregnancy. Medicine to stop contractions. Medicines to help mature the baby's lungs. These may be prescribed if the risk of delivery is high. Medicines to prevent your baby from developing cerebral palsy. If the labor happens before 34 weeks of pregnancy, you may need to stay in the hospital. What should I do if I  think I am in preterm labor? If you think that you are going into preterm labor, call your health care provider right away. How can I prevent preterm labor in future pregnancies? To increase your chance of having a full-term pregnancy: Do not use any tobacco products, such as cigarettes, chewing tobacco, and e-cigarettes. If you need help quitting, ask your health care provider. Do not use street drugs or medicines that have not been prescribed to you during your pregnancy. Talk with your health care provider before taking any herbal supplements, even if you have been taking them regularly. Make sure you gain a healthy amount of weight during your pregnancy. Watch for infection. If you think that you might have an infection, get it checked right away. Make sure to tell your health care provider if you have gone into preterm labor before. This information is not intended to replace advice given to you by your health care provider. Make sure you discuss any questions you have with your health care provider. Document Revised: 04/01/2019 Document Reviewed: 04/30/2016 Elsevier Patient Education  2020 [employer].

## 2024-11-16 NOTE — Progress Notes (Signed)
 US : GA = 34+1 weeks Single active female fetus, cephalic, FHR = 130 bpm, fundal posterior pl, gr1, AFI = 14.9 cm, MVP = 3.8 cm, BPP = 8/8, RI: 0.61, 0.61,   59%,  EFW 2166 g, 22%, AC 18%, cervix closed, nl ov's

## 2024-11-17 LAB — CBC
Hematocrit: 28.6 % — ABNORMAL LOW (ref 34.0–46.6)
Hemoglobin: 9.3 g/dL — ABNORMAL LOW (ref 11.1–15.9)
MCH: 28.9 pg (ref 26.6–33.0)
MCHC: 32.5 g/dL (ref 31.5–35.7)
MCV: 89 fL (ref 79–97)
Platelets: 321 x10E3/uL (ref 150–450)
RBC: 3.22 x10E6/uL — ABNORMAL LOW (ref 3.77–5.28)
RDW: 14.4 % (ref 11.7–15.4)
WBC: 10.6 x10E3/uL (ref 3.4–10.8)

## 2024-11-21 ENCOUNTER — Ambulatory Visit: Payer: Self-pay | Admitting: Women's Health

## 2024-11-21 ENCOUNTER — Other Ambulatory Visit

## 2024-11-23 ENCOUNTER — Other Ambulatory Visit: Payer: Self-pay | Admitting: Obstetrics & Gynecology

## 2024-11-23 DIAGNOSIS — Z3A34 34 weeks gestation of pregnancy: Secondary | ICD-10-CM

## 2024-11-23 DIAGNOSIS — Z6841 Body Mass Index (BMI) 40.0 and over, adult: Secondary | ICD-10-CM

## 2024-11-23 DIAGNOSIS — O0992 Supervision of high risk pregnancy, unspecified, second trimester: Secondary | ICD-10-CM

## 2024-11-23 DIAGNOSIS — O10919 Unspecified pre-existing hypertension complicating pregnancy, unspecified trimester: Secondary | ICD-10-CM

## 2024-11-23 DIAGNOSIS — O365990 Maternal care for other known or suspected poor fetal growth, unspecified trimester, not applicable or unspecified: Secondary | ICD-10-CM

## 2024-11-24 ENCOUNTER — Other Ambulatory Visit

## 2024-11-24 ENCOUNTER — Encounter: Payer: Self-pay | Admitting: Women's Health

## 2024-11-24 ENCOUNTER — Ambulatory Visit: Admitting: Women's Health

## 2024-11-24 VITALS — BP 110/75 | HR 103 | Wt 244.0 lb

## 2024-11-24 DIAGNOSIS — Z1389 Encounter for screening for other disorder: Secondary | ICD-10-CM

## 2024-11-24 DIAGNOSIS — O0993 Supervision of high risk pregnancy, unspecified, third trimester: Secondary | ICD-10-CM

## 2024-11-24 DIAGNOSIS — Z6841 Body Mass Index (BMI) 40.0 and over, adult: Secondary | ICD-10-CM

## 2024-11-24 DIAGNOSIS — O09893 Supervision of other high risk pregnancies, third trimester: Secondary | ICD-10-CM | POA: Diagnosis not present

## 2024-11-24 DIAGNOSIS — Z331 Pregnant state, incidental: Secondary | ICD-10-CM

## 2024-11-24 DIAGNOSIS — O99891 Other specified diseases and conditions complicating pregnancy: Secondary | ICD-10-CM | POA: Diagnosis not present

## 2024-11-24 DIAGNOSIS — O0992 Supervision of high risk pregnancy, unspecified, second trimester: Secondary | ICD-10-CM

## 2024-11-24 DIAGNOSIS — Z3A34 34 weeks gestation of pregnancy: Secondary | ICD-10-CM

## 2024-11-24 DIAGNOSIS — O365990 Maternal care for other known or suspected poor fetal growth, unspecified trimester, not applicable or unspecified: Secondary | ICD-10-CM

## 2024-11-24 DIAGNOSIS — O35EXX0 Maternal care for other (suspected) fetal abnormality and damage, fetal genitourinary anomalies, not applicable or unspecified: Secondary | ICD-10-CM | POA: Diagnosis not present

## 2024-11-24 DIAGNOSIS — O99013 Anemia complicating pregnancy, third trimester: Secondary | ICD-10-CM | POA: Diagnosis not present

## 2024-11-24 DIAGNOSIS — D649 Anemia, unspecified: Secondary | ICD-10-CM | POA: Diagnosis not present

## 2024-11-24 DIAGNOSIS — O10913 Unspecified pre-existing hypertension complicating pregnancy, third trimester: Secondary | ICD-10-CM | POA: Diagnosis not present

## 2024-11-24 DIAGNOSIS — O10919 Unspecified pre-existing hypertension complicating pregnancy, unspecified trimester: Secondary | ICD-10-CM

## 2024-11-24 DIAGNOSIS — Z3A35 35 weeks gestation of pregnancy: Secondary | ICD-10-CM | POA: Diagnosis not present

## 2024-11-24 DIAGNOSIS — R7689 Other specified abnormal immunological findings in serum: Secondary | ICD-10-CM | POA: Diagnosis not present

## 2024-11-24 LAB — POCT URINALYSIS DIPSTICK OB
Blood, UA: NEGATIVE
Glucose, UA: NEGATIVE
Ketones, UA: NEGATIVE
Nitrite, UA: NEGATIVE

## 2024-11-24 NOTE — Progress Notes (Signed)
 US  35+2 wks,cephalic,BPP 8/8,FHR 145 bpm,AFI 15 cm,posterior placenta gr 1,mild left hydronephrosis with dilated ureter N/C renal pelvis 6.9 mm,normal right kidney, RI .62,.65=75%

## 2024-11-24 NOTE — Patient Instructions (Signed)
 Lorelei, thank you for choosing our office today! We appreciate the opportunity to meet your healthcare needs. You may receive a short survey by mail, e-mail, or through [employer]. If you are happy with your care we would appreciate if you could take just a few minutes to complete the survey questions. We read all of your comments and take your feedback very seriously. Thank you again for choosing our office.  Center for [employer] Team at [facility]  [facility] ([address] [place], [place] [address]) Entrance C, located off of [address] Free 24/7 valet parking   CLASSES: Go to [email or web address] to register for classes (childbirth, breastfeeding, waterbirth, infant CPR, daddy bootcamp, etc.)  Call the office [phone]) or go to [facility] if: You begin to have strong, frequent contractions Your water breaks.  Sometimes it is a big gush of fluid, sometimes it is just a trickle that keeps getting your panties wet or running down your legs You have vaginal bleeding.  It is normal to have a small amount of spotting if your cervix was checked.  You don't feel your baby moving like normal.  If you don't, get you something to eat and drink and lay down and focus on feeling your baby move.   If your baby is still not moving like normal, you should call the office or go to [facility].  Call the office [phone]) or go to [facility] hospital for these signs of pre-eclampsia: Severe headache that does not go away with Tylenol  Visual changes- seeing spots, double, blurred vision Pain under your right breast or upper abdomen that does not go away with Tums or heartburn medicine Nausea and/or vomiting Severe swelling in your hands, feet, and face   Tdap Vaccine It is recommended that you get the Tdap vaccine during the third trimester of EACH pregnancy to help protect your baby from getting pertussis (whooping cough) 27-36 weeks is the BEST time to do  this so that you can pass the protection on to your baby. During pregnancy is better than after pregnancy, but if you are unable to get it during pregnancy it will be offered at the hospital.  You can get this vaccine with us , at the health department, your family doctor, or some local pharmacies Everyone who will be around your baby should also be up-to-date on their vaccines before the baby comes. Adults (who are not pregnant) only need 1 dose of Tdap during adulthood.   [facility] Pediatricians/Family Doctors [facility]): [address] Luba BROCKS, [phone]           [facility]: [address], [phone]                [facility] Medicine [facility]): [address], [phone] (call to ask if accepting patients) [facility] Department: [address], [place], [phone]    [facility] Pediatricians/Family Doctors [facility]): [phone], [phone] [facility]: [address] [place], [phone] [facility]: [address], [phone]  [facility] Doctors  [facility]): [phone] [facility]: [address] [phone]   [facility] Doctors [facility]: [address], [phone]  [facility] Family Doctors  [facility]: [phone], [phone]  Home Blood Pressure Monitoring for Patients   Your provider has recommended that you check your  blood pressure (BP) at least once a week at home. If you do not have a blood pressure cuff at home, one will be provided for you. Contact your provider if you have not received your monitor within 1 week.   Helpful Tips for Accurate Home Blood Pressure Checks  Don't smoke, exercise, or drink caffeine 30 minutes before checking your BP Use the restroom before checking your BP (a full bladder can raise your  pressure) Relax in a comfortable upright chair Feet on the ground Left arm resting comfortably on a flat surface at the level of your heart Legs uncrossed Back supported Sit quietly and don't talk Place the cuff on your bare arm Adjust snuggly, so that only two fingertips can fit between your skin and the top of the cuff Check 2 readings separated by at least one minute Keep a log of your BP readings For a visual, please reference this diagram: http://ccnc.care/bpdiagram  Provider Name: [facility]     Phone: [phone]  Zone 1: ALL CLEAR  Continue to monitor your symptoms:  BP reading is less than 140 (top number) or less than 90 (bottom number)  No right upper stomach pain No headaches or seeing spots No feeling nauseated or throwing up No swelling in face and hands  Zone 2: CAUTION Call your doctor's office for any of the following:  BP reading is greater than 140 (top number) or greater than 90 (bottom number)  Stomach pain under your ribs in the middle or right side Headaches or seeing spots Feeling nauseated or throwing up Swelling in face and hands  Zone 3: EMERGENCY  Seek immediate medical care if you have any of the following:  BP reading is greater than160 (top number) or greater than 110 (bottom number) Severe headaches not improving with Tylenol  Serious difficulty catching your breath Any worsening symptoms from Zone 2  Preterm Labor and Birth Information  The normal length of a pregnancy is 39-41 weeks. Preterm labor is when labor starts before 37 completed weeks of pregnancy. What are the risk factors for preterm labor? Preterm labor is more likely to occur in women who: Have certain infections during pregnancy such as a bladder infection, sexually transmitted infection, or infection inside the uterus (chorioamnionitis). Have a shorter-than-normal cervix. Have gone into preterm labor before. Have had surgery on their cervix. Are younger than age 62  or older than age 28. Are African American. Are pregnant with twins or multiple babies (multiple gestation). Take street drugs or smoke while pregnant. Do not gain enough weight while pregnant. Became pregnant shortly after having been pregnant. What are the symptoms of preterm labor? Symptoms of preterm labor include: Cramps similar to those that can happen during a menstrual period. The cramps may happen with diarrhea. Pain in the abdomen or lower back. Regular uterine contractions that may feel like tightening of the abdomen. A feeling of increased pressure in the pelvis. Increased watery or bloody mucus discharge from the vagina. Water breaking (ruptured amniotic sac). Why is it important to recognize signs of preterm labor? It is important to recognize signs of preterm labor because babies who are born prematurely may not be fully developed. This can put them at an increased risk for: Long-term (chronic) heart and lung problems. Difficulty immediately after birth with regulating body systems, including blood sugar, body temperature, heart rate, and breathing rate. Bleeding in the brain. Cerebral palsy. Learning difficulties. Death. These risks are highest for babies who are born before 34 weeks  of pregnancy. How is preterm labor treated? Treatment depends on the length of your pregnancy, your condition, and the health of your baby. It may involve: Having a stitch (suture) placed in your cervix to prevent your cervix from opening too early (cerclage). Taking or being given medicines, such as: Hormone medicines. These may be given early in pregnancy to help support the pregnancy. Medicine to stop contractions. Medicines to help mature the baby's lungs. These may be prescribed if the risk of delivery is high. Medicines to prevent your baby from developing cerebral palsy. If the labor happens before 34 weeks of pregnancy, you may need to stay in the hospital. What should I do if I  think I am in preterm labor? If you think that you are going into preterm labor, call your health care provider right away. How can I prevent preterm labor in future pregnancies? To increase your chance of having a full-term pregnancy: Do not use any tobacco products, such as cigarettes, chewing tobacco, and e-cigarettes. If you need help quitting, ask your health care provider. Do not use street drugs or medicines that have not been prescribed to you during your pregnancy. Talk with your health care provider before taking any herbal supplements, even if you have been taking them regularly. Make sure you gain a healthy amount of weight during your pregnancy. Watch for infection. If you think that you might have an infection, get it checked right away. Make sure to tell your health care provider if you have gone into preterm labor before. This information is not intended to replace advice given to you by your health care provider. Make sure you discuss any questions you have with your health care provider. Document Revised: 04/01/2019 Document Reviewed: 04/30/2016 Elsevier Patient Education  2020 [employer].

## 2024-11-24 NOTE — Progress Notes (Signed)
 HIGH-RISK PREGNANCY VISIT Patient name: Alicia Adkins MRN [id number]  Date of birth: 16-Feb-1996 Chief Complaint:   Routine Prenatal Visit and Pregnancy Ultrasound  History of Present Illness:   Alicia Adkins is a 28 y.o. G23P2002 female at [redacted]w[redacted]d with an Estimated Date of Delivery: 12/27/24 being seen today for ongoing management of a high-risk pregnancy complicated by chronic hypertension currently on no meds and PG BMI 40, resolved FGR, +RPR.    Today she reports no complaints. Contractions: Not present.  .  Movement: Present. denies leaking of fluid.      10/07/2024   11:41 AM 07/21/2024    3:21 PM 04/28/2023   10:03 AM 03/26/2021    8:51 AM 12/17/2020    2:39 PM  Depression screen PHQ 2/9  Decreased Interest 0 0 0 0 0  Down, Depressed, Hopeless 0 0 0 0 0  PHQ - 2 Score 0 0 0 0 0  Altered sleeping 1 1 1 1 1   Tired, decreased energy 1 1 1 1 1   Change in appetite 0 1 0 0 0  Feeling bad or failure about yourself  0 1 0 0 0  Trouble concentrating 0 0 0 0 0  Moving slowly or fidgety/restless 0 0 0 0 0  Suicidal thoughts 0 0 0 0 0  PHQ-9 Score 2  4  2  2  2       Data saved with a previous flowsheet row definition        07/21/2024    3:21 PM 04/28/2023   10:03 AM 03/26/2021    8:51 AM 12/17/2020    2:40 PM  GAD 7 : Generalized Anxiety Score  Nervous, Anxious, on Edge 0 0 0 0  Control/stop worrying 0 0 0 0  Worry too much - different things 1 0 0 1  Trouble relaxing 1 0 0 1  Restless 0 0 0 0  Easily annoyed or irritable 1 1 0 1  Afraid - awful might happen 0 0 0 0  Total GAD 7 Score 3 1 0 3     Review of Systems:   Pertinent items are noted in HPI Denies abnormal vaginal discharge w/ itching/odor/irritation, headaches, visual changes, shortness of breath, chest pain, abdominal pain, severe nausea/vomiting, or problems with urination or bowel movements unless otherwise stated above. Pertinent History Reviewed:  Reviewed past medical,surgical, social, obstetrical  and family history.  Reviewed problem list, medications and allergies. Physical Assessment:   Vitals:   11/24/24 1517  BP: 110/75  Pulse: (!) 103  Weight: 244 lb (110.7 kg)  Body mass index is 43.22 kg/m.           Physical Examination:   General appearance: alert, well appearing, and in no distress  Mental status: alert, oriented to person, place, and time  Skin: warm & dry   Extremities: Edema: None    Cardiovascular: normal heart rate noted  Respiratory: normal respiratory effort, no distress  Abdomen: gravid, soft, non-tender  Pelvic: Cervical exam deferred         Fetal Status:     Movement: Present    Fetal Surveillance Testing today: US  35+2 wks,cephalic,BPP 8/8,FHR 145 bpm,AFI 15 cm,posterior placenta gr 1,mild left hydronephrosis with dilated ureter N/C renal pelvis 6.9 mm,normal right kidney, RI .62,.65=75%   Chaperone: N/A  Results for orders placed or performed in visit on 11/24/24 (from the past 24 hours)  POC Urinalysis Dipstick OB   Collection Time: 11/24/24  3:14 PM  Result  Value Ref Range   Color, UA     Clarity, UA     Glucose, UA Negative Negative   Bilirubin, UA     Ketones, UA negative    Spec Grav, UA     Blood, UA negative    pH, UA     POC,PROTEIN,UA Trace Negative, Trace, Small (1+), Moderate (2+), Large (3+), 4+   Urobilinogen, UA     Nitrite, UA negative    Leukocytes, UA Trace (A) Negative   Appearance     Odor      Assessment & Plan:  High-risk pregnancy: [id number] at [redacted]w[redacted]d with an Estimated Date of Delivery: 12/27/24   1) Resolved FGR, EFW up to 22% (from 7%) and AC up to 18% (from 4%) w/ normal UAD and bpp 8/8   2) CHTN, stable, no meds, ASA, checking bp's every other day, let us  know if >140/90 or pre-e sx   3) PGBMI 40> currently 43   4) +RPR during pregnancy> s/p tx, last check down to 1:8 from 1:16, plan repeat 8wks from last   5) Anemia> taking po Fe as directed, fingerstick 9.4, will check CBC, discussed possibility of IV Fe,  is ok w/ this if needed  6) Fetal mild Lt hydronephrosis w/ dilated ureter, RPD 6.40mm> stable  Meds: No orders of the defined types were placed in this encounter.  Labs/procedures today: U/S  Treatment Plan:  weekly BPP, EFW q4w, IOL 38-39.6  Reviewed: Preterm labor symptoms and general obstetric precautions including but not limited to vaginal bleeding, contractions, leaking of fluid and fetal movement were reviewed in detail with the patient.  All questions were answered. Does have home bp cuff. Office bp cuff given: not applicable.   Follow-up: Return for As scheduled.   Future Appointments  Date Time Provider Department Center  11/28/2024  3:10 PM [facility] NURSE [facility]  12/01/2024  3:00 PM [facility] - FTO[facility] US  [facility]FTIMG None  12/01/2024  3:50 PM Kizzie Suzen SAUNDERS, CNM [facility]  12/05/2024  3:10 PM [facility] NURSE [facility]  12/08/2024 10:00 AM [facility] - [facility] US  [facility] None  12/08/2024 10:50 AM Newton Mering, CNM [facility]  12/13/2024  1:30 PM [facility] - FT IMG 2 [facility] None  12/13/2024  2:30 PM Jayne Vonn DEL, MD [facility]  12/21/2024  2:15 PM [facility] - [facility] US  [facility] None  12/26/2024  3:10 PM [facility] NURSE [facility]    Orders Placed This Encounter  Procedures   POC Urinalysis Dipstick OB   Suzen SAUNDERS Kizzie CNM, [facility] 11/24/2024 3:31 PM

## 2024-11-28 ENCOUNTER — Other Ambulatory Visit

## 2024-11-30 ENCOUNTER — Ambulatory Visit

## 2024-11-30 ENCOUNTER — Other Ambulatory Visit: Payer: Self-pay | Admitting: Obstetrics & Gynecology

## 2024-11-30 ENCOUNTER — Encounter: Payer: Self-pay | Admitting: Obstetrics & Gynecology

## 2024-11-30 VITALS — BP 104/69 | HR 103

## 2024-11-30 DIAGNOSIS — O10913 Unspecified pre-existing hypertension complicating pregnancy, third trimester: Secondary | ICD-10-CM | POA: Diagnosis not present

## 2024-11-30 DIAGNOSIS — O365931 Maternal care for other known or suspected poor fetal growth, third trimester, fetus 1: Secondary | ICD-10-CM

## 2024-11-30 DIAGNOSIS — O10919 Unspecified pre-existing hypertension complicating pregnancy, unspecified trimester: Secondary | ICD-10-CM

## 2024-11-30 DIAGNOSIS — Z3A36 36 weeks gestation of pregnancy: Secondary | ICD-10-CM | POA: Diagnosis not present

## 2024-11-30 DIAGNOSIS — O99213 Obesity complicating pregnancy, third trimester: Secondary | ICD-10-CM | POA: Diagnosis not present

## 2024-11-30 DIAGNOSIS — O288 Other abnormal findings on antenatal screening of mother: Secondary | ICD-10-CM

## 2024-11-30 NOTE — Progress Notes (Signed)
° °  NURSE VISIT- NST  SUBJECTIVE:  Alicia Adkins is a 28 y.o. G6P2002 female at [redacted]w[redacted]d, here for a NST for pregnancy complicated by [facility] and Morbid obesity (BMI >=40).  She reports active fetal movement, contractions: none, vaginal bleeding: none, membranes: intact.   OBJECTIVE:  LMP 03/22/2024   Appears well, no apparent distress  No results found for this or any previous visit (from the past 24 hours).  NST: FHR baseline 135 bpm, Variability: moderate, Accelerations:present, Decelerations:  Absent= Cat 1/reactive Toco: none   ASSESSMENT: G3P2002 at [redacted]w[redacted]d with CHTN and Morbid obesity (BMI >=40) NST reactive  PLAN: EFM strip reviewed by Dr. Ozan   Recommendations: keep next appointment as scheduled    Alicia Adkins  11/30/2024 2:34 PM

## 2024-12-01 ENCOUNTER — Encounter: Payer: Self-pay | Admitting: Advanced Practice Midwife

## 2024-12-01 ENCOUNTER — Other Ambulatory Visit

## 2024-12-01 ENCOUNTER — Ambulatory Visit: Admitting: Advanced Practice Midwife

## 2024-12-01 ENCOUNTER — Other Ambulatory Visit (HOSPITAL_COMMUNITY)
Admission: RE | Admit: 2024-12-01 | Discharge: 2024-12-01 | Disposition: A | Discharge status: Home / Self Care | Source: Ambulatory Visit | Attending: Obstetrics & Gynecology | Admitting: Obstetrics & Gynecology

## 2024-12-01 VITALS — BP 127/81 | HR 96 | Wt 245.0 lb

## 2024-12-01 DIAGNOSIS — O10919 Unspecified pre-existing hypertension complicating pregnancy, unspecified trimester: Secondary | ICD-10-CM

## 2024-12-01 DIAGNOSIS — A539 Syphilis, unspecified: Secondary | ICD-10-CM

## 2024-12-01 DIAGNOSIS — O99213 Obesity complicating pregnancy, third trimester: Secondary | ICD-10-CM

## 2024-12-01 DIAGNOSIS — Z113 Encounter for screening for infections with a predominantly sexual mode of transmission: Secondary | ICD-10-CM

## 2024-12-01 DIAGNOSIS — O98113 Syphilis complicating pregnancy, third trimester: Secondary | ICD-10-CM | POA: Diagnosis not present

## 2024-12-01 DIAGNOSIS — O0993 Supervision of high risk pregnancy, unspecified, third trimester: Secondary | ICD-10-CM

## 2024-12-01 DIAGNOSIS — Z124 Encounter for screening for malignant neoplasm of cervix: Secondary | ICD-10-CM

## 2024-12-01 DIAGNOSIS — E6609 Other obesity due to excess calories: Secondary | ICD-10-CM

## 2024-12-01 DIAGNOSIS — Z3A36 36 weeks gestation of pregnancy: Secondary | ICD-10-CM

## 2024-12-01 DIAGNOSIS — O365931 Maternal care for other known or suspected poor fetal growth, third trimester, fetus 1: Secondary | ICD-10-CM

## 2024-12-01 NOTE — Progress Notes (Signed)
 HIGH-RISK PREGNANCY VISIT Patient name: Alicia Adkins MRN [id number]  Date of birth: 1996/12/10 Chief Complaint:   Routine Prenatal Visit  History of Present Illness:   Alicia Adkins is a 28 y.o. G70P2002 female at [redacted]w[redacted]d with an Estimated Date of Delivery: 12/27/24 being seen today for ongoing management of a high-risk pregnancy complicated by chronic hypertension currently on no meds and morbid obesity BMI >=40.    Today she reports no complaints. Contractions: Not present. Vag. Bleeding: None.  Movement: Present. denies leaking of fluid.      10/07/2024   11:41 AM 07/21/2024    3:21 PM 04/28/2023   10:03 AM 03/26/2021    8:51 AM 12/17/2020    2:39 PM  Depression screen PHQ 2/9  Decreased Interest 0 0 0 0 0  Down, Depressed, Hopeless 0 0 0 0 0  PHQ - 2 Score 0 0 0 0 0  Altered sleeping 1 1 1 1 1   Tired, decreased energy 1 1 1 1 1   Change in appetite 0 1 0 0 0  Feeling bad or failure about yourself  0 1 0 0 0  Trouble concentrating 0 0 0 0 0  Moving slowly or fidgety/restless 0 0 0 0 0  Suicidal thoughts 0 0 0 0 0  PHQ-9 Score 2  4  2  2  2       Data saved with a previous flowsheet row definition        07/21/2024    3:21 PM 04/28/2023   10:03 AM 03/26/2021    8:51 AM 12/17/2020    2:40 PM  GAD 7 : Generalized Anxiety Score  Nervous, Anxious, on Edge 0 0 0 0  Control/stop worrying 0 0 0 0  Worry too much - different things 1 0 0 1  Trouble relaxing 1 0 0 1  Restless 0 0 0 0  Easily annoyed or irritable 1 1 0 1  Afraid - awful might happen 0 0 0 0  Total GAD 7 Score 3 1 0 3     Review of Systems:   Pertinent items are noted in HPI Denies abnormal vaginal discharge w/ itching/odor/irritation, headaches, visual changes, shortness of breath, chest pain, abdominal pain, severe nausea/vomiting, or problems with urination or bowel movements unless otherwise stated above. Pertinent History Reviewed:  Reviewed past medical,surgical, social, obstetrical and family  history.  Reviewed problem list, medications and allergies. Physical Assessment:   Vitals:   12/01/24 1527  BP: 127/81  Pulse: 96  Weight: 245 lb (111.1 kg)  Body mass index is 43.4 kg/m.           Physical Examination:   General appearance: alert, well appearing, and in no distress  Mental status: alert, oriented to person, place, and time  Skin: warm & dry   Extremities:      Cardiovascular: normal heart rate noted  Respiratory: normal respiratory effort, no distress  Abdomen: gravid, soft, non-tender  Pelvic: Cervical exam performed  Dilation: 1 Effacement (%): Thick Station: -2  Chaperone: Alan Fischer    Fetal Status:     Movement: Present    Fetal Surveillance Testing today: US  36+2 wks,cephalic,BPP 8/8,FHR 142 bpm,posterior placenta gr 2,AFI 19 cm,RI .55,.49,.69=58% limited doppler because of continuous breathing      No results found for this or any previous visit (from the past 24 hours).  Assessment & Plan:  High-risk pregnancy: G3P2002 at [redacted]w[redacted]d with an Estimated Date of Delivery: 12/27/24   1.  Routine Papanicolaou smear (Primary)  - Cytology - PAP( [place])  2. Screen for STD (sexually transmitted disease)  - Cytology - PAP( [place])  3. [redacted] weeks gestation of pregnancy  - Cytology - PAP( [place]) - Culture, beta strep (group b only) - RPR  4. Supervision of high risk pregnancy in third trimester  - Cytology - PAP( [place]) - Culture, beta strep (group b only) - RPR  5. Chronic hypertension affecting pregnancy, no meds with good BP IOL set for 12/27, orders in  6. Other obesity due to excess calories affecting pregnancy, antepartum Continue testing  7. Syphilis Check RPR today    Meds: No orders of the defined types were placed in this encounter.   Orders:  Orders Placed This Encounter  Procedures   Culture, beta strep (group b only)   RPR       Reviewed: Term labor symptoms and general obstetric precautions  including but not limited to vaginal bleeding, contractions, leaking of fluid and fetal movement were reviewed in detail with the patient.  All questions were answered. Does have home bp cuff. Office bp cuff given: not applicable. Check bp daily, let us  know if consistently >140 and/or >90.  Follow-up: No follow-ups on file.   Future Appointments  Date Time Provider Department Center  12/05/2024  3:10 PM [facility] NURSE [facility]  12/08/2024 10:00 AM [facility] - [facility] US  [facility] None  12/08/2024 10:50 AM Newton Mering, CNM [facility]  12/13/2024  1:30 PM [facility] - FT IMG 2 [facility] None  12/13/2024  2:30 PM Jayne Vonn DEL, MD [facility]  12/17/2024  6:30 AM [facility] SCHED ROOM [facility] None    Orders Placed This Encounter  Procedures   Culture, beta strep (group b only)   RPR   Mering Newton , DNP, CNM [facility] 12/01/2024 5:00 PM

## 2024-12-01 NOTE — Progress Notes (Signed)
 US  36+2 wks,cephalic,BPP 8/8,FHR 142 bpm,posterior placenta gr 2,AFI 19 cm,RI .55,.49,.69=58% limited doppler because of continuous breathing

## 2024-12-03 LAB — SYPHILIS: RPR W/REFLEX TO RPR TITER AND TREPONEMAL ANTIBODIES, TRADITIONAL SCREENING AND DIAGNOSIS ALGORITHM: RPR Ser Ql: REACTIVE — AB

## 2024-12-03 LAB — RPR, QUANT+TP ABS (REFLEX)
Rapid Plasma Reagin, Quant: 1:4 {titer} — ABNORMAL HIGH
T Pallidum Abs: REACTIVE — AB

## 2024-12-04 LAB — CULTURE, BETA STREP (GROUP B ONLY): Strep Gp B Culture: POSITIVE — AB

## 2024-12-05 ENCOUNTER — Telehealth (HOSPITAL_COMMUNITY): Payer: Self-pay | Admitting: *Deleted

## 2024-12-05 ENCOUNTER — Other Ambulatory Visit

## 2024-12-05 ENCOUNTER — Encounter (HOSPITAL_COMMUNITY): Payer: Self-pay | Admitting: *Deleted

## 2024-12-05 NOTE — Telephone Encounter (Signed)
 Preadmission screen

## 2024-12-06 ENCOUNTER — Other Ambulatory Visit: Payer: Self-pay | Admitting: Obstetrics & Gynecology

## 2024-12-06 ENCOUNTER — Other Ambulatory Visit: Payer: Self-pay | Admitting: Advanced Practice Midwife

## 2024-12-06 DIAGNOSIS — O99210 Obesity complicating pregnancy, unspecified trimester: Secondary | ICD-10-CM

## 2024-12-06 DIAGNOSIS — Z6841 Body Mass Index (BMI) 40.0 and over, adult: Secondary | ICD-10-CM

## 2024-12-06 DIAGNOSIS — A539 Syphilis, unspecified: Secondary | ICD-10-CM

## 2024-12-06 DIAGNOSIS — O10919 Unspecified pre-existing hypertension complicating pregnancy, unspecified trimester: Secondary | ICD-10-CM

## 2024-12-06 DIAGNOSIS — Z3A38 38 weeks gestation of pregnancy: Secondary | ICD-10-CM

## 2024-12-06 DIAGNOSIS — O98119 Syphilis complicating pregnancy, unspecified trimester: Secondary | ICD-10-CM

## 2024-12-08 ENCOUNTER — Other Ambulatory Visit: Payer: Self-pay | Admitting: Obstetrics & Gynecology

## 2024-12-08 ENCOUNTER — Ambulatory Visit: Admitting: Advanced Practice Midwife

## 2024-12-08 ENCOUNTER — Other Ambulatory Visit

## 2024-12-08 VITALS — BP 123/81 | HR 102 | Wt 246.0 lb

## 2024-12-08 DIAGNOSIS — O10919 Unspecified pre-existing hypertension complicating pregnancy, unspecified trimester: Secondary | ICD-10-CM

## 2024-12-08 DIAGNOSIS — Z6841 Body Mass Index (BMI) 40.0 and over, adult: Secondary | ICD-10-CM

## 2024-12-08 DIAGNOSIS — Z3A37 37 weeks gestation of pregnancy: Secondary | ICD-10-CM

## 2024-12-08 DIAGNOSIS — O0993 Supervision of high risk pregnancy, unspecified, third trimester: Secondary | ICD-10-CM

## 2024-12-08 DIAGNOSIS — Z3A38 38 weeks gestation of pregnancy: Secondary | ICD-10-CM

## 2024-12-08 DIAGNOSIS — O35EXX0 Maternal care for other (suspected) fetal abnormality and damage, fetal genitourinary anomalies, not applicable or unspecified: Secondary | ICD-10-CM | POA: Diagnosis not present

## 2024-12-08 DIAGNOSIS — O10913 Unspecified pre-existing hypertension complicating pregnancy, third trimester: Secondary | ICD-10-CM

## 2024-12-08 DIAGNOSIS — O99213 Obesity complicating pregnancy, third trimester: Secondary | ICD-10-CM | POA: Diagnosis not present

## 2024-12-08 DIAGNOSIS — O99210 Obesity complicating pregnancy, unspecified trimester: Secondary | ICD-10-CM

## 2024-12-08 LAB — CYTOLOGY - PAP
Chlamydia: NEGATIVE
Comment: NEGATIVE
Comment: NEGATIVE
Comment: NORMAL
Diagnosis: NEGATIVE
High risk HPV: NEGATIVE
Neisseria Gonorrhea: NEGATIVE

## 2024-12-08 NOTE — Progress Notes (Signed)
 US  37+2 wks,cephalic,BPP 8/8,FHR 155 bpm,left mild hydronephrosis with dilated ureter,LRP 7.6 mm,RK WNL,AFI 21 cm,posterior placenta gr 2, RI .64,.59,.59=72%,EFW 2922 g 35%,AC 61%,FL 7%

## 2024-12-08 NOTE — Progress Notes (Signed)
 HIGH-RISK PREGNANCY VISIT Patient name: Alicia Adkins MRN [id number]  Date of birth: 11/11/1996 Chief Complaint:   Routine Prenatal Visit  History of Present Illness:   Alicia Adkins is a 28 y.o. G14P2002 female at [redacted]w[redacted]d with an Estimated Date of Delivery: 12/27/24 being seen today for ongoing management of a high-risk pregnancy complicated by chronic hypertension currently on no meds and morbid obesity BMI >=40.    Today she reports no complaints. Contractions: Not present. Vag. Bleeding: None.  Movement: Present. denies leaking of fluid.      10/07/2024   11:41 AM 07/21/2024    3:21 PM 04/28/2023   10:03 AM 03/26/2021    8:51 AM 12/17/2020    2:39 PM  Depression screen PHQ 2/9  Decreased Interest 0 0 0 0 0  Down, Depressed, Hopeless 0 0 0 0 0  PHQ - 2 Score 0 0 0 0 0  Altered sleeping 1 1 1 1 1   Tired, decreased energy 1 1 1 1 1   Change in appetite 0 1 0 0 0  Feeling bad or failure about yourself  0 1 0 0 0  Trouble concentrating 0 0 0 0 0  Moving slowly or fidgety/restless 0 0 0 0 0  Suicidal thoughts 0 0 0 0 0  PHQ-9 Score 2  4  2  2  2       Data saved with a previous flowsheet row definition        07/21/2024    3:21 PM 04/28/2023   10:03 AM 03/26/2021    8:51 AM 12/17/2020    2:40 PM  GAD 7 : Generalized Anxiety Score  Nervous, Anxious, on Edge 0 0 0 0  Control/stop worrying 0 0 0 0  Worry too much - different things 1 0 0 1  Trouble relaxing 1 0 0 1  Restless 0 0 0 0  Easily annoyed or irritable 1 1 0 1  Afraid - awful might happen 0 0 0 0  Total GAD 7 Score 3 1 0 3     Review of Systems:   Pertinent items are noted in HPI Denies abnormal vaginal discharge w/ itching/odor/irritation, headaches, visual changes, shortness of breath, chest pain, abdominal pain, severe nausea/vomiting, or problems with urination or bowel movements unless otherwise stated above. Pertinent History Reviewed:  Reviewed past medical,surgical, social, obstetrical and family  history.  Reviewed problem list, medications and allergies. Physical Assessment:   Vitals:   12/08/24 1039 12/08/24 1103  BP: 139/86 123/81  Pulse: (!) 102   Weight: 246 lb (111.6 kg)   Body mass index is 43.58 kg/m.           Physical Examination:   General appearance: alert, well appearing, and in no distress  Mental status: alert, oriented to person, place, and time  Skin: warm & dry   Extremities:      Cardiovascular: normal heart rate noted  Respiratory: normal respiratory effort, no distress  Abdomen: gravid, soft, non-tender  Pelvic: Cervical exam performed  Dilation: 3 Effacement (%): 50 Station: -1  Chaperone: Psychologist, Clinical, RN    Fetal Status:     Movement: Present Presentation: Vertex  Fetal Surveillance Testing today: US  37+2 wks,cephalic,BPP 8/8,FHR 155 bpm,left mild hydronephrosis with dilated ureter,LRP 7.6 mm,RK WNL,AFI 21 cm,posterior placenta gr 2, RI .64,.59,.59=72%,EFW 2922 g 35%,AC 61%,FL 7%      No results found for this or any previous visit (from the past 24 hours).  Assessment & Plan:  High-risk  pregnancy: [id number] at [redacted]w[redacted]d with an Estimated Date of Delivery: 12/27/24   1. [redacted] weeks gestation of pregnancy (Primary)   2. Chronic hypertension affecting pregnancy, no meds with good BP   3. Other obesity due to excess calories affecting pregnancy, antepartum Continue testing, IOL set for 12/27  4. Supervision of high risk pregnancy in third trimester     Meds: No orders of the defined types were placed in this encounter.   Orders: No orders of the defined types were placed in this encounter.    Labs/procedures today: SVE and U/S   Reviewed: Term labor symptoms and general obstetric precautions including but not limited to vaginal bleeding, contractions, leaking of fluid and fetal movement were reviewed in detail with the patient.  All questions were answered. Does have home bp cuff. Office bp cuff given: not applicable. Check bp weekly, let us  know  if consistently >140 and/or >90.  Follow-up: No follow-ups on file.   Future Appointments  Date Time Provider Department Center  12/13/2024  1:30 PM [facility] IMG 2 [facility] None  12/13/2024  2:30 PM Jayne Vonn DEL, MD [facility]  12/17/2024  6:30 AM [facility] SCHED ROOM [facility] None    No orders of the defined types were placed in this encounter.  Cathlean Ely , DNP, CNM [facility] 12/08/2024 11:27 AM

## 2024-12-13 ENCOUNTER — Encounter: Payer: Self-pay | Admitting: Obstetrics & Gynecology

## 2024-12-13 ENCOUNTER — Other Ambulatory Visit: Admitting: Radiology

## 2024-12-13 ENCOUNTER — Ambulatory Visit (INDEPENDENT_AMBULATORY_CARE_PROVIDER_SITE_OTHER): Admitting: Obstetrics & Gynecology

## 2024-12-13 VITALS — BP 117/75 | HR 103 | Wt 240.0 lb

## 2024-12-13 DIAGNOSIS — O98113 Syphilis complicating pregnancy, third trimester: Secondary | ICD-10-CM

## 2024-12-13 DIAGNOSIS — Z6841 Body Mass Index (BMI) 40.0 and over, adult: Secondary | ICD-10-CM

## 2024-12-13 DIAGNOSIS — O0993 Supervision of high risk pregnancy, unspecified, third trimester: Secondary | ICD-10-CM

## 2024-12-13 DIAGNOSIS — O10913 Unspecified pre-existing hypertension complicating pregnancy, third trimester: Secondary | ICD-10-CM

## 2024-12-13 DIAGNOSIS — Z3A38 38 weeks gestation of pregnancy: Secondary | ICD-10-CM

## 2024-12-13 DIAGNOSIS — A539 Syphilis, unspecified: Secondary | ICD-10-CM

## 2024-12-13 DIAGNOSIS — O10919 Unspecified pre-existing hypertension complicating pregnancy, unspecified trimester: Secondary | ICD-10-CM

## 2024-12-13 NOTE — Progress Notes (Signed)
 US : GA = 38 weeks Single active female fetus, cephalic, FHR = 150 bpm, AFI = 15.5 cm, MVP = 4.7 cm, BPP = 8/8, ltd view of cx

## 2024-12-13 NOTE — Progress Notes (Signed)
 "   HIGH-RISK PREGNANCY VISIT Patient name: Alicia Adkins MRN [id number]  Date of birth: 10/08/1996 Chief Complaint:   Routine Prenatal Visit  History of Present Illness:   Alicia Adkins is a 28 y.o. G3P2002 female at [redacted]w[redacted]d with an Estimated Date of Delivery: 12/27/24 being seen today for ongoing management of a high-risk pregnancy complicated by     ICD-10-CM   1. Supervision of high risk pregnancy in third trimester  O09.93     2. Chronic hypertension affecting pregnancy, no meds with good BP  O10.919     3. BMI 40.0-44.9, adult (HCC)  Z68.41     4. Syphilis: treated titers  1:16 > 1:8 . 1:4  A53.9      .    Today she reports no complaints. Contractions: Irritability. Vag. Bleeding: None.  Movement: Present. denies leaking of fluid.      10/07/2024   11:41 AM 07/21/2024    3:21 PM 04/28/2023   10:03 AM 03/26/2021    8:51 AM 12/17/2020    2:39 PM  Depression screen PHQ 2/9  Decreased Interest 0 0 0 0 0  Down, Depressed, Hopeless 0 0 0 0 0  PHQ - 2 Score 0 0 0 0 0  Altered sleeping 1 1 1 1 1   Tired, decreased energy 1 1 1 1 1   Change in appetite 0 1 0 0 0  Feeling bad or failure about yourself  0 1 0 0 0  Trouble concentrating 0 0 0 0 0  Moving slowly or fidgety/restless 0 0 0 0 0  Suicidal thoughts 0 0 0 0 0  PHQ-9 Score 2  4  2  2  2       Data saved with a previous flowsheet row definition        07/21/2024    3:21 PM 04/28/2023   10:03 AM 03/26/2021    8:51 AM 12/17/2020    2:40 PM  GAD 7 : Generalized Anxiety Score  Nervous, Anxious, on Edge 0 0 0 0  Control/stop worrying 0 0 0 0  Worry too much - different things 1 0 0 1  Trouble relaxing 1 0 0 1  Restless 0 0 0 0  Easily annoyed or irritable 1 1 0 1  Afraid - awful might happen 0 0 0 0  Total GAD 7 Score 3 1 0 3     Review of Systems:   Pertinent items are noted in HPI Denies abnormal vaginal discharge w/ itching/odor/irritation, headaches, visual changes, shortness of breath, chest pain,  abdominal pain, severe nausea/vomiting, or problems with urination or bowel movements unless otherwise stated above. Pertinent History Reviewed:  Reviewed past medical,surgical, social, obstetrical and family history.  Reviewed problem list, medications and allergies. Physical Assessment:   Vitals:   12/13/24 1356  BP: 117/75  Pulse: (!) 103  Weight: 240 lb (108.9 kg)  Body mass index is 42.51 kg/m.           Physical Examination:   General appearance: alert, well appearing, and in no distress  Mental status: alert, oriented to person, place, and time  Skin: warm & dry   Extremities:      Cardiovascular: normal heart rate noted  Respiratory: normal respiratory effort, no distress  Abdomen: gravid, soft, non-tender  Pelvic: Cervical exam performed  4/50/-2/soft/post       Fetal Status:     Movement: Present    Fetal Surveillance Testing today: BPP 8/8   Chaperone: Alan Fischer  No results found for this or any previous visit (from the past 24 hours).  Assessment & Plan:  High-risk pregnancy: G3P2002 at [redacted]w[redacted]d with an Estimated Date of Delivery: 12/27/24      ICD-10-CM   1. Supervision of high risk pregnancy in third trimester  O09.93     2. Chronic hypertension affecting pregnancy, no meds with good BP  O10.919     3. BMI 40.0-44.9, adult (HCC)  Z68.41     4. Syphilis: treated titers  1:16 > 1:8 . 1:4  A53.9          Meds: No orders of the defined types were placed in this encounter.   Orders: No orders of the defined types were placed in this encounter.    Labs/procedures today: U/S  Treatment Plan:  IOL scheduled    Follow-up: Return in about 2 weeks (around 12/27/2024) for BP check, MyChart Connect visit, Nurse only.   Future Appointments  Date Time Provider Department Center  12/13/2024  2:30 PM Jayne Vonn DEL, MD [facility]  12/17/2024  6:30 AM [facility] SCHED ROOM [facility] None    No orders of the defined types were placed in this  encounter.  Vonn DEL Jayne  Attending Physician for the Center for [facility] 12/13/2024 2:29 PM  "

## 2024-12-17 ENCOUNTER — Other Ambulatory Visit: Payer: Self-pay

## 2024-12-17 ENCOUNTER — Encounter (HOSPITAL_COMMUNITY): Payer: Self-pay | Admitting: Family Medicine

## 2024-12-17 ENCOUNTER — Inpatient Hospital Stay (HOSPITAL_COMMUNITY)

## 2024-12-17 ENCOUNTER — Inpatient Hospital Stay (HOSPITAL_COMMUNITY)
Admission: AD | Admit: 2024-12-17 | Discharge: 2024-12-20 | Disposition: A | Discharge status: Home / Self Care | Attending: Obstetrics & Gynecology | Admitting: Obstetrics & Gynecology

## 2024-12-17 DIAGNOSIS — O99214 Obesity complicating childbirth: Secondary | ICD-10-CM | POA: Diagnosis present

## 2024-12-17 DIAGNOSIS — Z7982 Long term (current) use of aspirin: Secondary | ICD-10-CM

## 2024-12-17 DIAGNOSIS — O99824 Streptococcus B carrier state complicating childbirth: Secondary | ICD-10-CM | POA: Diagnosis present

## 2024-12-17 DIAGNOSIS — O1092 Unspecified pre-existing hypertension complicating childbirth: Principal | ICD-10-CM | POA: Diagnosis present

## 2024-12-17 DIAGNOSIS — F32A Depression, unspecified: Secondary | ICD-10-CM | POA: Diagnosis not present

## 2024-12-17 DIAGNOSIS — Z3A38 38 weeks gestation of pregnancy: Secondary | ICD-10-CM | POA: Diagnosis not present

## 2024-12-17 DIAGNOSIS — O0993 Supervision of high risk pregnancy, unspecified, third trimester: Secondary | ICD-10-CM

## 2024-12-17 DIAGNOSIS — Z8249 Family history of ischemic heart disease and other diseases of the circulatory system: Secondary | ICD-10-CM

## 2024-12-17 DIAGNOSIS — O10919 Unspecified pre-existing hypertension complicating pregnancy, unspecified trimester: Secondary | ICD-10-CM | POA: Diagnosis present

## 2024-12-17 DIAGNOSIS — Z833 Family history of diabetes mellitus: Secondary | ICD-10-CM | POA: Diagnosis not present

## 2024-12-17 DIAGNOSIS — O35EXX0 Maternal care for other (suspected) fetal abnormality and damage, fetal genitourinary anomalies, not applicable or unspecified: Secondary | ICD-10-CM | POA: Diagnosis present

## 2024-12-17 DIAGNOSIS — O99210 Obesity complicating pregnancy, unspecified trimester: Secondary | ICD-10-CM | POA: Diagnosis present

## 2024-12-17 DIAGNOSIS — I10 Essential (primary) hypertension: Secondary | ICD-10-CM | POA: Diagnosis not present

## 2024-12-17 DIAGNOSIS — E66813 Obesity, class 3: Secondary | ICD-10-CM | POA: Diagnosis present

## 2024-12-17 DIAGNOSIS — O0990 Supervision of high risk pregnancy, unspecified, unspecified trimester: Secondary | ICD-10-CM

## 2024-12-17 DIAGNOSIS — Z302 Encounter for sterilization: Secondary | ICD-10-CM | POA: Diagnosis not present

## 2024-12-17 LAB — CBC
HCT: 30.2 % — ABNORMAL LOW (ref 36.0–46.0)
Hemoglobin: 10 g/dL — ABNORMAL LOW (ref 12.0–15.0)
MCH: 28.5 pg (ref 26.0–34.0)
MCHC: 33.1 g/dL (ref 30.0–36.0)
MCV: 86 fL (ref 80.0–100.0)
Platelets: 278 K/uL (ref 150–400)
RBC: 3.51 MIL/uL — ABNORMAL LOW (ref 3.87–5.11)
RDW: 15.9 % — ABNORMAL HIGH (ref 11.5–15.5)
WBC: 8.2 K/uL (ref 4.0–10.5)
nRBC: 0 % (ref 0.0–0.2)

## 2024-12-17 LAB — TYPE AND SCREEN
ABO/RH(D): B POS
Antibody Screen: NEGATIVE

## 2024-12-17 MED ORDER — TERBUTALINE SULFATE 1 MG/ML IJ SOLN: 0.2500 mg | Freq: Once | INTRAMUSCULAR | Status: DC | PRN

## 2024-12-17 MED ORDER — OXYTOCIN-SODIUM CHLORIDE 30-0.9 UT/500ML-% IV SOLN
1.0000 m[IU]/min | INTRAVENOUS | Status: DC
  Administered 2024-12-17: 2 m[IU]/min via INTRAVENOUS
  Filled 2024-12-17: qty 500

## 2024-12-17 MED ORDER — OXYCODONE-ACETAMINOPHEN 5-325 MG PO TABS: 2.0000 | ORAL_TABLET | ORAL | Status: DC | PRN

## 2024-12-17 MED ORDER — LACTATED RINGERS IV SOLN: INTRAVENOUS | Status: DC

## 2024-12-17 MED ORDER — ACETAMINOPHEN 325 MG PO TABS: 650.0000 mg | ORAL_TABLET | ORAL | Status: DC | PRN

## 2024-12-17 MED ORDER — ONDANSETRON HCL 4 MG/2ML IJ SOLN: 4.0000 mg | Freq: Four times a day (QID) | INTRAMUSCULAR | Status: DC | PRN

## 2024-12-17 MED ORDER — OXYTOCIN BOLUS FROM INFUSION
333.0000 mL | Freq: Once | INTRAVENOUS | Status: AC
  Administered 2024-12-18: 333 mL via INTRAVENOUS

## 2024-12-17 MED ORDER — LIDOCAINE HCL (PF) 1 % IJ SOLN: 30.0000 mL | INTRAMUSCULAR | Status: DC | PRN

## 2024-12-17 MED ORDER — FENTANYL CITRATE (PF) 100 MCG/2ML IJ SOLN: 100.0000 ug | INTRAMUSCULAR | Status: DC | PRN

## 2024-12-17 MED ORDER — OXYTOCIN-SODIUM CHLORIDE 30-0.9 UT/500ML-% IV SOLN
2.5000 [IU]/h | INTRAVENOUS | Status: DC
  Administered 2024-12-18: 2.5 [IU]/h via INTRAVENOUS

## 2024-12-17 MED ORDER — SODIUM CHLORIDE 0.9 % IV SOLN
5.0000 10*6.[IU] | Freq: Once | INTRAVENOUS | Status: AC
  Administered 2024-12-17: 5 10*6.[IU] via INTRAVENOUS
  Filled 2024-12-17: qty 5

## 2024-12-17 MED ORDER — PENICILLIN G POT IN DEXTROSE 60000 UNIT/ML IV SOLN
3.0000 10*6.[IU] | INTRAVENOUS | Status: DC
  Administered 2024-12-18 (×2): 3 10*6.[IU] via INTRAVENOUS
  Filled 2024-12-17 (×2): qty 50

## 2024-12-17 MED ORDER — SOD CITRATE-CITRIC ACID 500-334 MG/5ML PO SOLN: 30.0000 mL | ORAL | Status: DC | PRN

## 2024-12-17 MED ORDER — LACTATED RINGERS IV SOLN: 500.0000 mL | INTRAVENOUS | Status: DC | PRN

## 2024-12-17 MED ORDER — OXYCODONE-ACETAMINOPHEN 5-325 MG PO TABS: 1.0000 | ORAL_TABLET | ORAL | Status: DC | PRN

## 2024-12-17 MED ORDER — FLEET ENEMA RE ENEM: 1.0000 | ENEMA | RECTAL | Status: DC | PRN

## 2024-12-17 MED ORDER — HYDROXYZINE HCL 50 MG PO TABS: 50.0000 mg | ORAL_TABLET | Freq: Four times a day (QID) | ORAL | Status: DC | PRN

## 2024-12-17 NOTE — H&P (Signed)
 OBSTETRIC ADMISSION HISTORY AND PHYSICAL  Alicia Adkins is a 28 y.o. female G42P2002 with IUP at [redacted]w[redacted]d (dated by LMP c/w [redacted]w[redacted]d US , Estimated Date of Delivery: 12/27/24) presenting for IOL due to [facility].   She reports +FMs, No LOF, no VB, no blurry vision, headaches or peripheral edema, and RUQ pain.    She plans on formula feeding. She request BTL for birth control.  [facility] at [facility]   Prenatal History/Complications:   cHTN Obesity Depression  Past Medical History: Past Medical History:  Diagnosis Date   Chlamydia 02/19/2022   02/19/22 treated with doxycycline , no sex POC in 4 weeks    Depression    Hypertension    Trichimoniasis 08/06/2018   +trich on pap, treated with flagyl , POC 8/26___    Past Surgical History: Past Surgical History:  Procedure Laterality Date   WISDOM TOOTH EXTRACTION      Obstetrical History: OB History     Gravida  3   Para  2   Term  2   Preterm  0   AB  0   Living  2      SAB  0   IAB  0   Ectopic  0   Multiple  0   Live Births  2           Social History Social History   Socioeconomic History   Marital status: Single    Spouse name: Not on file   Number of children: 1   Years of education: 12   Highest education level: High school graduate  Occupational History   Not on file  Tobacco Use   Smoking status: Never   Smokeless tobacco: Never  Vaping Use   Vaping status: Never Used  Substance and Sexual Activity   Alcohol use: Not Currently    Comment: socially    Drug use: Not Currently    Types: Marijuana   Sexual activity: Yes    Birth control/protection: None  Other Topics Concern   Not on file  Social History Narrative   Not on file   Social Drivers of Health   Tobacco Use: Low Risk (12/17/2024)   Patient History    Smoking Tobacco Use: Never    Smokeless Tobacco Use: Never    Passive Exposure: Not on file  Financial Resource Strain: Low Risk (07/21/2024)   Overall  Financial Resource Strain (CARDIA)    Difficulty of Paying Living Expenses: Not hard at all  Food Insecurity: Patient Declined (12/17/2024)   Epic    Worried About Programme Researcher, Broadcasting/film/video in the Last Year: Patient declined    Barista in the Last Year: Patient declined  Transportation Needs: Patient Declined (12/17/2024)   Epic    Lack of Transportation (Medical): Patient declined    Lack of Transportation (Non-Medical): Patient declined  Physical Activity: Inactive (07/21/2024)   Exercise Vital Sign    Days of Exercise per Week: 0 days    Minutes of Exercise per Session: 0 min  Stress: Stress Concern Present (07/21/2024)   [employer] of Occupational Health - Occupational Stress Questionnaire    Feeling of Stress: To some extent  Social Connections: Socially Isolated (07/21/2024)   Social Connection and Isolation Panel    Frequency of Communication with Friends and Family: Once a week    Frequency of Social Gatherings with Friends and Family: Once a week    Attends Religious Services: 1 to 4 times per year  Active Member of Clubs or Organizations: No    Attends Banker Meetings: Never    Marital Status: Never married  Depression (PHQ2-9): Low Risk (10/07/2024)   Depression (PHQ2-9)    PHQ-2 Score: 2  Alcohol Screen: Low Risk (07/21/2024)   Alcohol Screen    Last Alcohol Screening Score (AUDIT): 1  Housing: Patient Declined (12/17/2024)   Epic    Unable to Pay for Housing in the Last Year: Patient declined    Number of Times Moved in the Last Year: Not on file    Homeless in the Last Year: Patient declined  Utilities: Patient Declined (12/17/2024)   Epic    Threatened with loss of utilities: Patient declined  Health Literacy: Adequate Health Literacy (07/21/2024)   B1300 Health Literacy    Frequency of need for help with medical instructions: Never    Family History: Family History  Problem Relation Age of Onset   Heart attack Paternal Grandfather     Hypertension Maternal Grandmother    Cancer Mother        cervical   Diabetes Mother    Hypertension Mother     Allergies: Allergies[1]  Medications Prior to Admission  Medication Sig Dispense Refill Last Dose/Taking   aspirin  EC 81 MG tablet Take 1 tablet (81 mg total) by mouth daily. 30 tablet 6 Past Week   ferrous sulfate  325 (65 FE) MG tablet Take 1 tablet (325 mg total) by mouth every other day. 45 tablet 2 12/16/2024   prenatal vitamin w/FE, FA (PRENATAL 1 + 1) 27-1 MG TABS tablet Take 1 tablet daily by mouth 90 tablet 3 12/16/2024   Blood Pressure Monitor MISC For regular home bp monitoring during pregnancy 1 each 0      Review of Systems  All systems reviewed and negative except as stated in HPI.  Blood pressure 120/64, pulse 96, temperature 98.7 F (37.1 C), temperature source Oral, resp. rate 16, height 5' 4 (1.626 m), weight 110.6 kg, last menstrual period 03/22/2024. General appearance: alert, cooperative, and appears stated age Lungs: breathing comfortably on room air Heart: regular rate Abdomen: soft, non-tender; gravid Extremities: no edema of bilateral lower extremities DTR's intact Presentation: cephalic Fetal monitoringBaseline: 135 bpm, Variability: Good {> 6 bpm), Accelerations: Reactive, and Decelerations: Absent Uterine activityNone  Dilation: 3.5 Effacement (%): Thick Station: -3 Exam by:: [employer] RN   Prenatal labs: ABO, Rh: --/--/B POS (12/27 2133) Antibody: NEG (12/27 2133) Rubella: 4.71 (07/31 1620) RPR: Reactive (12/11 1631)  HBsAg: Negative (07/31 1620)  HIV: Non Reactive (10/17 0852)  GBS: Positive/-- (12/11 0440)  2 hr Glucola Normal Genetic screening  LR Female Anatomy US  Appears Normal Last US : At [redacted]w[redacted]d - cephalic presentation, EFW 2922g (35 %tile), AC 61%  Prenatal Transfer Tool  Maternal Diabetes: No Genetic Screening: Normal Maternal Ultrasounds/Referrals: Normal Fetal Ultrasounds or other Referrals:  None Maternal  Substance Abuse:  No Significant Maternal Medications:  None Significant Maternal Lab Results:  Group B Strep positive Number of Prenatal Visits:greater than 3 verified prenatal visits Other Comments:  None  Results for orders placed or performed during the hospital encounter of 12/17/24 (from the past 24 hours)  Type and screen [facility]   Collection Time: 12/17/24  9:33 PM  Result Value Ref Range   ABO/RH(D) B POS    Antibody Screen NEG    Sample Expiration      12/20/2024,2359 Performed at [facility], [address] [place], [place] [address]  CBC   Collection Time: 12/17/24  9:36 PM  Result Value Ref Range   WBC 8.2 4.0 - 10.5 K/uL   RBC 3.51 (L) 3.87 - 5.11 MIL/uL   Hemoglobin 10.0 (L) 12.0 - 15.0 g/dL   HCT 69.7 (L) 63.9 - 53.9 %   MCV 86.0 80.0 - 100.0 fL   MCH 28.5 26.0 - 34.0 pg   MCHC 33.1 30.0 - 36.0 g/dL   RDW 84.0 (H) 88.4 - 84.4 %   Platelets 278 150 - 400 K/uL   nRBC 0.0 0.0 - 0.2 %    Patient Active Problem List   Diagnosis Date Noted   Indication for care in labor or delivery 12/17/2024   Fetal renal anomaly, single gestation 11/10/2024   Chronic hypertension affecting pregnancy, no meds with good BP 10/07/2024   Syphilis 07/25/2024   ASB (asymptomatic bacteriuria) 07/25/2024   Obesity affecting pregnancy, antepartum 07/21/2024   Supervision of high-risk pregnancy 07/20/2024   Depression 08/04/2018    Assessment/Plan:  CRISTAN HOUT is a 28 y.o. G3P2002 at [redacted]w[redacted]d here for IOL due to cHTN.   #Labor: IOL process explained to patient in detail. Will start with pitocin . Plan on AROM after adequately treated for GBS. #Pain: Per patient request, considering epidural #FWB: Cat I #ID:  GBS (+) > PCN #MOF: Formula #MOC: BTL (consent signed 10/20/2024) #Circ:  N/A #cHTN: Not on medications. Will continue to monitor BP's. PIH labs pending.   Barkley Angles, MD OB Fellow, Faculty [facility], Center for [facility] 12/17/2024 11:21 PM        [1] No Known Allergies

## 2024-12-18 ENCOUNTER — Inpatient Hospital Stay (HOSPITAL_COMMUNITY): Admitting: Anesthesiology

## 2024-12-18 ENCOUNTER — Encounter (HOSPITAL_COMMUNITY): Payer: Self-pay | Admitting: Family Medicine

## 2024-12-18 DIAGNOSIS — Z3A38 38 weeks gestation of pregnancy: Secondary | ICD-10-CM

## 2024-12-18 DIAGNOSIS — O99214 Obesity complicating childbirth: Secondary | ICD-10-CM

## 2024-12-18 DIAGNOSIS — O1002 Pre-existing essential hypertension complicating childbirth: Secondary | ICD-10-CM

## 2024-12-18 DIAGNOSIS — O99820 Streptococcus B carrier state complicating pregnancy: Secondary | ICD-10-CM

## 2024-12-18 DIAGNOSIS — O99344 Other mental disorders complicating childbirth: Secondary | ICD-10-CM

## 2024-12-18 LAB — COMPREHENSIVE METABOLIC PANEL WITH GFR
ALT: 23 U/L (ref 0–44)
AST: 26 U/L (ref 15–41)
Albumin: 3.1 g/dL — ABNORMAL LOW (ref 3.5–5.0)
Alkaline Phosphatase: 107 U/L (ref 38–126)
Anion gap: 8 (ref 5–15)
BUN: <5 mg/dL — ABNORMAL LOW (ref 6–20)
CO2: 22 mmol/L (ref 22–32)
Calcium: 8.6 mg/dL — ABNORMAL LOW (ref 8.9–10.3)
Chloride: 108 mmol/L (ref 98–111)
Creatinine, Ser: 0.4 mg/dL — ABNORMAL LOW (ref 0.44–1.00)
GFR, Estimated: >60 mL/min
Glucose, Bld: 97 mg/dL (ref 70–99)
Potassium: 3.5 mmol/L (ref 3.5–5.1)
Sodium: 138 mmol/L (ref 135–145)
Total Bilirubin: 0.3 mg/dL (ref 0.0–1.2)
Total Protein: 6.1 g/dL — ABNORMAL LOW (ref 6.5–8.1)

## 2024-12-18 LAB — SYPHILIS: RPR W/REFLEX TO RPR TITER AND TREPONEMAL ANTIBODIES, TRADITIONAL SCREENING AND DIAGNOSIS ALGORITHM
RPR Ser Ql: REACTIVE — AB
RPR Titer: 1:2 {titer}

## 2024-12-18 MED ORDER — DIBUCAINE (PERIANAL) 1 % EX OINT: 1.0000 | TOPICAL_OINTMENT | CUTANEOUS | Status: DC | PRN

## 2024-12-18 MED ORDER — DIPHENHYDRAMINE HCL 50 MG/ML IJ SOLN: 12.5000 mg | INTRAMUSCULAR | Status: DC | PRN

## 2024-12-18 MED ORDER — POTASSIUM CHLORIDE CRYS ER 20 MEQ PO TBCR
20.0000 meq | EXTENDED_RELEASE_TABLET | Freq: Every day | ORAL | Status: DC
  Administered 2024-12-20: 20 meq via ORAL

## 2024-12-18 MED ORDER — LACTATED RINGERS IV SOLN
500.0000 mL | Freq: Once | INTRAVENOUS | Status: AC
  Administered 2024-12-18: 500 mL via INTRAVENOUS

## 2024-12-18 MED ORDER — EPHEDRINE 5 MG/ML INJ: 10.0000 mg | INTRAVENOUS | Status: DC | PRN

## 2024-12-18 MED ORDER — ACETAMINOPHEN 325 MG PO TABS: 650.0000 mg | ORAL_TABLET | ORAL | Status: DC | PRN

## 2024-12-18 MED ORDER — LIDOCAINE HCL (PF) 1 % IJ SOLN
INTRAMUSCULAR | Status: DC | PRN
  Administered 2024-12-18: 10 mL via EPIDURAL

## 2024-12-18 MED ORDER — METOCLOPRAMIDE HCL 10 MG PO TABS
10.0000 mg | ORAL_TABLET | Freq: Once | ORAL | Status: AC
  Administered 2024-12-19: 10 mg via ORAL

## 2024-12-18 MED ORDER — PHENYLEPHRINE 80 MCG/ML (10ML) SYRINGE FOR IV PUSH (FOR BLOOD PRESSURE SUPPORT): 80.0000 ug | PREFILLED_SYRINGE | INTRAVENOUS | Status: DC | PRN

## 2024-12-18 MED ORDER — TRANEXAMIC ACID-NACL 1000-0.7 MG/100ML-% IV SOLN: 1000.0000 mg | Freq: Once | INTRAVENOUS | Status: AC

## 2024-12-18 MED ORDER — DIPHENHYDRAMINE HCL 25 MG PO CAPS: 25.0000 mg | ORAL_CAPSULE | Freq: Four times a day (QID) | ORAL | Status: DC | PRN

## 2024-12-18 MED ORDER — SIMETHICONE 80 MG PO CHEW: 80.0000 mg | CHEWABLE_TABLET | ORAL | Status: DC | PRN

## 2024-12-18 MED ORDER — FUROSEMIDE 20 MG PO TABS
20.0000 mg | ORAL_TABLET | Freq: Every day | ORAL | Status: DC
  Administered 2024-12-18 – 2024-12-20 (×2): 20 mg via ORAL
  Filled 2024-12-18: qty 1

## 2024-12-18 MED ORDER — TRANEXAMIC ACID-NACL 1000-0.7 MG/100ML-% IV SOLN
INTRAVENOUS | Status: AC
  Administered 2024-12-18: 1000 mg via INTRAVENOUS
  Filled 2024-12-18: qty 100

## 2024-12-18 MED ORDER — IBUPROFEN 600 MG PO TABS
600.0000 mg | ORAL_TABLET | Freq: Four times a day (QID) | ORAL | Status: DC
  Administered 2024-12-18 – 2024-12-20 (×5): 600 mg via ORAL
  Filled 2024-12-18 (×2): qty 1

## 2024-12-18 MED ORDER — ONDANSETRON HCL 4 MG PO TABS: 4.0000 mg | ORAL_TABLET | ORAL | Status: DC | PRN

## 2024-12-18 MED ORDER — FENTANYL-BUPIVACAINE-NACL 0.5-0.125-0.9 MG/250ML-% EP SOLN
12.0000 mL/h | EPIDURAL | Status: DC | PRN
  Administered 2024-12-18: 12 mL/h via EPIDURAL
  Filled 2024-12-18: qty 250

## 2024-12-18 MED ORDER — ONDANSETRON HCL 4 MG/2ML IJ SOLN: 4.0000 mg | INTRAMUSCULAR | Status: DC | PRN

## 2024-12-18 MED ORDER — SENNOSIDES-DOCUSATE SODIUM 8.6-50 MG PO TABS: 2.0000 | ORAL_TABLET | ORAL | Status: DC

## 2024-12-18 MED ORDER — TETANUS-DIPHTH-ACELL PERTUSSIS 5-2-15.5 LF-MCG/0.5 IM SUSP: 0.5000 mL | Freq: Once | INTRAMUSCULAR | Status: DC

## 2024-12-18 MED ORDER — BENZOCAINE-MENTHOL 20-0.5 % EX AERO
1.0000 | INHALATION_SPRAY | CUTANEOUS | Status: DC | PRN
  Administered 2024-12-18: 1 via TOPICAL
  Filled 2024-12-18: qty 56

## 2024-12-18 MED ORDER — LACTATED RINGERS IV SOLN: INTRAVENOUS | Status: AC

## 2024-12-18 MED ORDER — MISOPROSTOL 200 MCG PO TABS
800.0000 ug | ORAL_TABLET | Freq: Once | ORAL | Status: AC
  Administered 2024-12-18: 800 ug via BUCCAL
  Filled 2024-12-18: qty 4

## 2024-12-18 MED ORDER — COCONUT OIL OIL: 1.0000 | TOPICAL_OIL | Status: DC | PRN

## 2024-12-18 MED ORDER — PRENATAL MULTIVITAMIN CH
1.0000 | ORAL_TABLET | Freq: Every day | ORAL | Status: DC
  Administered 2024-12-18: 1 via ORAL
  Filled 2024-12-18: qty 1

## 2024-12-18 MED ORDER — ZOLPIDEM TARTRATE 5 MG PO TABS: 5.0000 mg | ORAL_TABLET | Freq: Every evening | ORAL | Status: DC | PRN

## 2024-12-18 MED ORDER — FAMOTIDINE 20 MG PO TABS
40.0000 mg | ORAL_TABLET | Freq: Once | ORAL | Status: AC
  Administered 2024-12-19: 40 mg via ORAL

## 2024-12-18 MED ORDER — PHENYLEPHRINE 80 MCG/ML (10ML) SYRINGE FOR IV PUSH (FOR BLOOD PRESSURE SUPPORT)
80.0000 ug | PREFILLED_SYRINGE | INTRAVENOUS | Status: DC | PRN
  Filled 2024-12-18: qty 10

## 2024-12-18 MED ORDER — WITCH HAZEL-GLYCERIN EX PADS: 1.0000 | MEDICATED_PAD | CUTANEOUS | Status: DC | PRN

## 2024-12-18 NOTE — Anesthesia Procedure Notes (Signed)
 Epidural Patient location during procedure: OB Start time: 12/18/2024 6:30 AM End time: 12/18/2024 6:45 AM  Staffing Anesthesiologist: Niels Marien CROME, MD Performed: anesthesiologist   Preanesthetic Checklist Completed: patient identified, IV checked, risks and benefits discussed, monitors and equipment checked, pre-op evaluation and timeout performed  Epidural Patient position: sitting Prep: DuraPrep and site prepped and draped Patient monitoring: continuous pulse ox, blood pressure, heart rate and cardiac monitor Approach: midline Location: L3-L4 Injection technique: LOR air  Needle:  Needle type: Tuohy  Needle gauge: 17 G Needle length: 9 cm Needle insertion depth: 7 cm Catheter type: closed end flexible Catheter size: 19 Gauge Catheter at skin depth: 12 cm Test dose: negative  Assessment Sensory level: T8 Events: blood not aspirated, no cerebrospinal fluid, injection not painful, no injection resistance, no paresthesia and negative IV test  Additional Notes Patient identified. Risks/Benefits/Options discussed with patient including but not limited to bleeding, infection, nerve damage, paralysis, failed block, incomplete pain control, headache, blood pressure changes, nausea, vomiting, reactions to medication both or allergic, itching and postpartum back pain. Confirmed with bedside nurse the patient's most recent platelet count. Confirmed with patient that they are not currently taking any anticoagulation, have any bleeding history or any family history of bleeding disorders. Patient expressed understanding and wished to proceed. All questions were answered. Sterile technique was used throughout the entire procedure. Please see nursing notes for vital signs. Test dose was given through epidural catheter and negative prior to continuing to dose epidural or start infusion. Warning signs of high block given to the patient including shortness of breath, tingling/numbness in  hands, complete motor block, or any concerning symptoms with instructions to call for help. Patient was given instructions on fall risk and not to get out of bed. All questions and concerns addressed with instructions to call with any issues or inadequate analgesia.    2 attempts. 1st attempt at L3/4. Unable to assess epidural space. Second attempt at L4/5 successful. Reason for block:procedure for pain

## 2024-12-18 NOTE — Anesthesia Preprocedure Evaluation (Signed)
"                                    Anesthesia Evaluation  Patient identified by MRN, date of birth, ID band Patient awake    Reviewed: Allergy & Precautions, NPO status , Patient's Chart, lab work & pertinent test results  Airway Mallampati: II  TM Distance: >3 FB Neck ROM: Full    Dental no notable dental hx.    Pulmonary neg pulmonary ROS   Pulmonary exam normal breath sounds clear to auscultation       Cardiovascular hypertension, Normal cardiovascular exam Rhythm:Regular Rate:Normal     Neuro/Psych  PSYCHIATRIC DISORDERS  Depression    negative neurological ROS     GI/Hepatic negative GI ROS, Neg liver ROS,,,  Endo/Other    Class 3 obesity (BMI 42)  Renal/GU negative Renal ROS  negative genitourinary   Musculoskeletal negative musculoskeletal ROS (+)    Abdominal   Peds  Hematology negative hematology ROS (+)   Anesthesia Other Findings IOL for cHTN  Reproductive/Obstetrics                              Anesthesia Physical Anesthesia Plan  ASA: 3  Anesthesia Plan: Epidural   Post-op Pain Management:    Induction:   PONV Risk Score and Plan: Treatment may vary due to age or medical condition  Airway Management Planned: Natural Airway  Additional Equipment:   Intra-op Plan:   Post-operative Plan:   Informed Consent: I have reviewed the patients History and Physical, chart, labs and discussed the procedure including the risks, benefits and alternatives for the proposed anesthesia with the patient or authorized representative who has indicated his/her understanding and acceptance.       Plan Discussed with: Anesthesiologist  Anesthesia Plan Comments: (Patient identified. Risks, benefits, options discussed with patient including but not limited to bleeding, infection, nerve damage, paralysis, failed block, incomplete pain control, headache, blood pressure changes, nausea, vomiting, reactions to medication,  itching, and post partum back pain. Confirmed with bedside nurse the patient's most recent platelet count. Confirmed with the patient that they are not taking any anticoagulation, have any bleeding history or any family history of bleeding disorders. Patient expressed understanding and wishes to proceed. All questions were answered. )        Anesthesia Quick Evaluation  "

## 2024-12-18 NOTE — Discharge Summary (Signed)
 "    Postpartum Discharge Summary  Date of Service updated***     Patient Name: Alicia Adkins DOB: [birth year] MRN: [id number]  Date of admission: 12/17/2024 Delivery date:12/18/2024 Delivering provider: ILEAN NORLEEN GAILS Date of discharge: 12/18/2024  Admitting diagnosis: Indication for care in labor or delivery [O75.9] Intrauterine pregnancy: [redacted]w[redacted]d     Secondary diagnosis:  Principal Problem:   Indication for care in labor or delivery  Additional problems: ***    Discharge diagnosis: {DX.:23714}                                              Post partum procedures:{Postpartum procedures:23558} Augmentation: {Augmentation:20782} Complications: None  Hospital course: Induction of Labor With Vaginal Delivery   28 y.o. yo G3P2002 at [redacted]w[redacted]d was admitted to the hospital 12/17/2024 for induction of labor.  Indication for induction: cHTN.  Patient had an labor course which was uncomplicated. Membrane Rupture Time/Date: 4:58 AM,12/18/2024  Delivery Method:Vaginal, Spontaneous Operative Delivery:N/A Episiotomy: None Lacerations:  None Details of delivery can be found in separate delivery note.  Patient had a postpartum course complicated by***. Patient is discharged home 12/18/2024.  Newborn Data: Birth date:12/18/2024 Birth time:11:01 AM Gender:Female Living status:Living Apgars:9 ,9  Weight:   Magnesium  Sulfate received: No BMZ received: No Rhophylac:N/A MMR:No T-DaP:Given prenatally Flu: Already recieved RSV Vaccine received: {RSV:31013} Transfusion:{Transfusion received:30440034}  Immunizations received: Immunization History  Administered Date(s) Administered   Influenza, Seasonal, Injecte, Preservative Fre 10/13/2024   Influenza,inj,Quad PF,6+ Mos 09/22/2019   Moderna Sars-Covid-2 Vaccination 03/03/2020, 04/04/2020   Tdap 09/22/2019, 03/26/2021, 10/20/2024    Physical exam  Vitals:   12/18/24 0856 12/18/24 0901 12/18/24 0931 12/18/24 1001  BP: 122/61  124/63 122/63 127/69  Pulse: 100 (!) 102 (!) 103 (!) 105  Resp:      Temp:      TempSrc:      SpO2:      Weight:      Height:       General: {Exam; general:21111117} Lochia: {Desc; appropriate/inappropriate:30686::appropriate} Uterine Fundus: {Desc; firm/soft:30687} Incision: {Exam; incision:21111123} DVT Evaluation: {Exam; dvt:2111122} Labs: Lab Results  Component Value Date   WBC 8.2 12/17/2024   HGB 10.0 (L) 12/17/2024   HCT 30.2 (L) 12/17/2024   MCV 86.0 12/17/2024   PLT 278 12/17/2024      Latest Ref Rng & Units 12/17/2024   11:43 PM  CMP  Glucose 70 - 99 mg/dL 97   BUN 6 - 20 mg/dL <5   Creatinine 9.55 - 1.00 mg/dL 9.59   Sodium 864 - 854 mmol/L 138   Potassium 3.5 - 5.1 mmol/L 3.5   Chloride 98 - 111 mmol/L 108   CO2 22 - 32 mmol/L 22   Calcium 8.9 - 10.3 mg/dL 8.6   Total Protein 6.5 - 8.1 g/dL 6.1   Total Bilirubin 0.0 - 1.2 mg/dL 0.3   Alkaline Phos 38 - 126 U/L 107   AST 15 - 41 U/L 26   ALT 0 - 44 U/L 23    Edinburgh Score:    09/12/2021    4:31 PM  Edinburgh Postnatal Depression Scale Screening Tool  I have been able to laugh and see the funny side of things. 0   I have looked forward with enjoyment to things. 0   I have blamed myself unnecessarily when things went wrong. 1   I have been anxious or  worried for no good reason. 0   I have felt scared or panicky for no good reason. 0   Things have been getting on top of me. 1   I have been so unhappy that I have had difficulty sleeping. 0   I have felt sad or miserable. 0   I have been so unhappy that I have been crying. 0   The thought of harming myself has occurred to me. 0   Edinburgh Postnatal Depression Scale Total 2      Data saved with a previous flowsheet row definition   No data recorded  After visit meds:  Allergies as of 12/18/2024   No Known Allergies   Med Rec must be completed prior to using this [facility]***        Discharge home in stable condition Infant Feeding:  {Baby feeding:23562} Infant Disposition:{CHL IP OB HOME WITH FNUYZM:76418} Discharge instruction: per After Visit Summary and Postpartum booklet. Activity: Advance as tolerated. Pelvic rest for 6 weeks.  Diet: {OB ipzu:78888878} Future Appointments: Future Appointments  Date Time Provider Department Center  12/27/2024 10:10 AM [facility] NURSE [facility]   Follow up Visit: Message sent   Please schedule this patient for a In person postpartum visit in 4 weeks with the following provider: Any provider. Additional Postpartum F/U:BP check 1 week  High risk pregnancy complicated by: HTN Delivery mode:  Vaginal, Spontaneous Anticipated Birth Control:  planning pp BTL    12/18/2024 Norleen LULLA Rover, MD    "

## 2024-12-18 NOTE — Progress Notes (Signed)
 Labor Progress Note Alicia Adkins is a 28 y.o. G3P2002 at [redacted]w[redacted]d presented for IOL d/t cHTN.  S: Doing well.  O:  BP 128/71 (BP Location: Right Arm)   Pulse 85   Temp 98.1 F (36.7 C) (Oral)   Resp 16   Ht 5' 4 (1.626 m)   Wt 110.6 kg   LMP 03/22/2024   BMI 41.85 kg/m  EFM: 130/Moderate Variability/Accelerations (+),Decelerations (-)  CVE: Dilation: 4 Effacement (%): 50 Cervical Position: Posterior Station: -3 Presentation: Vertex Exam by:: MD Alicia Adkins   A&P: 28 y.o. H6E7997 [redacted]w[redacted]d  #Labor: Progressing well. AROM performed, clear fluid. Continue titrating pitocin .  #Pain: Per patient request. #FWB: Category I #GBS positive, adequately treated.  #cHTN: BP's reviewed, has remained normotensive while on L&D. Continue to monitor. Not on antihypertensives.   Alicia Bonn LITTIE Angles, MD 5:04 AM

## 2024-12-19 ENCOUNTER — Encounter (HOSPITAL_COMMUNITY)
Admission: AD | Disposition: A | Discharge status: Home / Self Care | Payer: Self-pay | Source: Home / Self Care | Attending: Obstetrics & Gynecology

## 2024-12-19 ENCOUNTER — Inpatient Hospital Stay (HOSPITAL_COMMUNITY): Admitting: Anesthesiology

## 2024-12-19 ENCOUNTER — Encounter (HOSPITAL_COMMUNITY): Admission: AD | Disposition: A | Payer: Self-pay | Source: Home / Self Care | Attending: Obstetrics and Gynecology

## 2024-12-19 ENCOUNTER — Encounter (HOSPITAL_COMMUNITY): Payer: Self-pay | Admitting: Family Medicine

## 2024-12-19 DIAGNOSIS — F32A Depression, unspecified: Secondary | ICD-10-CM | POA: Diagnosis not present

## 2024-12-19 DIAGNOSIS — I10 Essential (primary) hypertension: Secondary | ICD-10-CM | POA: Diagnosis not present

## 2024-12-19 DIAGNOSIS — Z302 Encounter for sterilization: Secondary | ICD-10-CM

## 2024-12-19 HISTORY — PX: TUBAL LIGATION: SHX77

## 2024-12-19 LAB — T.PALLIDUM AB, TOTAL: T Pallidum Abs: REACTIVE — AB

## 2024-12-19 SURGERY — LIGATION, FALLOPIAN TUBE, POSTPARTUM
Anesthesia: Choice

## 2024-12-19 SURGERY — LIGATION, FALLOPIAN TUBE, POSTPARTUM
Anesthesia: Epidural | Site: Abdomen | Laterality: Bilateral

## 2024-12-19 MED ORDER — STERILE WATER FOR IRRIGATION IR SOLN
Status: DC | PRN
  Administered 2024-12-19: 1000 mL

## 2024-12-19 MED ORDER — BUPIVACAINE HCL (PF) 0.25 % IJ SOLN
INTRAMUSCULAR | Status: AC
  Filled 2024-12-19: qty 20

## 2024-12-19 MED ORDER — SUCCINYLCHOLINE CHLORIDE 200 MG/10ML IV SOSY
PREFILLED_SYRINGE | INTRAVENOUS | Status: DC | PRN
  Administered 2024-12-19: 160 mg via INTRAVENOUS

## 2024-12-19 MED ORDER — MIDAZOLAM HCL (PF) 2 MG/2ML IJ SOLN
INTRAMUSCULAR | Status: DC | PRN
  Administered 2024-12-19 (×2): 1 mg via INTRAVENOUS

## 2024-12-19 MED ORDER — ONDANSETRON HCL 4 MG/2ML IJ SOLN: 4.0000 mg | Freq: Once | INTRAMUSCULAR | Status: DC | PRN

## 2024-12-19 MED ORDER — ONDANSETRON HCL 4 MG/2ML IJ SOLN
INTRAMUSCULAR | Status: DC | PRN
  Administered 2024-12-19: 4 mg via INTRAVENOUS

## 2024-12-19 MED ORDER — OXYCODONE HCL 5 MG/5ML PO SOLN: 5.0000 mg | Freq: Once | ORAL | Status: DC | PRN

## 2024-12-19 MED ORDER — KETOROLAC TROMETHAMINE 30 MG/ML IJ SOLN
INTRAMUSCULAR | Status: DC | PRN
  Administered 2024-12-19: 30 mg via INTRAVENOUS

## 2024-12-19 MED ORDER — FENTANYL CITRATE (PF) 100 MCG/2ML IJ SOLN
INTRAMUSCULAR | Status: AC
  Filled 2024-12-19: qty 2

## 2024-12-19 MED ORDER — DEXAMETHASONE SOD PHOSPHATE PF 10 MG/ML IJ SOLN
INTRAMUSCULAR | Status: DC | PRN
  Administered 2024-12-19: 10 mg via INTRAVENOUS

## 2024-12-19 MED ORDER — FENTANYL CITRATE (PF) 100 MCG/2ML IJ SOLN: 25.0000 ug | INTRAMUSCULAR | Status: DC | PRN

## 2024-12-19 MED ORDER — DEXMEDETOMIDINE HCL IN NACL 80 MCG/20ML IV SOLN
INTRAVENOUS | Status: DC | PRN
  Administered 2024-12-19: 12 ug via INTRAVENOUS

## 2024-12-19 MED ORDER — MEPERIDINE HCL 25 MG/ML IJ SOLN: 6.2500 mg | INTRAMUSCULAR | Status: DC | PRN

## 2024-12-19 MED ORDER — FENTANYL CITRATE (PF) 100 MCG/2ML IJ SOLN
INTRAMUSCULAR | Status: DC | PRN
  Administered 2024-12-19: 25 ug via EPIDURAL
  Administered 2024-12-19: 50 ug via EPIDURAL
  Administered 2024-12-19: 100 ug via EPIDURAL
  Administered 2024-12-19: 25 ug via EPIDURAL

## 2024-12-19 MED ORDER — LIDOCAINE-EPINEPHRINE (PF) 2 %-1:200000 IJ SOLN
INTRAMUSCULAR | Status: DC | PRN
  Administered 2024-12-19: 5 mL via EPIDURAL
  Administered 2024-12-19: 10 mL via EPIDURAL
  Administered 2024-12-19: 2 mL via EPIDURAL

## 2024-12-19 MED ORDER — MIDAZOLAM HCL 2 MG/2ML IJ SOLN
INTRAMUSCULAR | Status: AC
  Filled 2024-12-19: qty 2

## 2024-12-19 MED ORDER — PROPOFOL 10 MG/ML IV BOLUS
INTRAVENOUS | Status: DC | PRN
  Administered 2024-12-19: 160 mg via INTRAVENOUS

## 2024-12-19 MED ORDER — LIDOCAINE 2% (20 MG/ML) 5 ML SYRINGE
INTRAMUSCULAR | Status: DC | PRN
  Administered 2024-12-19: 100 mg via INTRAVENOUS

## 2024-12-19 MED ORDER — OXYCODONE HCL 5 MG PO TABS: 5.0000 mg | ORAL_TABLET | Freq: Once | ORAL | Status: DC | PRN

## 2024-12-19 SURGICAL SUPPLY — 21 items
BLADE SURG 11 STRL SS (BLADE) ×1 IMPLANT
DERMABOND ADVANCED .7 DNX12 (GAUZE/BANDAGES/DRESSINGS) IMPLANT
DISSECTOR SURG LIGASURE 21 (MISCELLANEOUS) IMPLANT
DRSG OPSITE POSTOP 3X4 (GAUZE/BANDAGES/DRESSINGS) ×1 IMPLANT
DURAPREP 26ML APPLICATOR (WOUND CARE) ×1 IMPLANT
GLOVE BIOGEL PI IND STRL 7.0 (GLOVE) ×1 IMPLANT
GLOVE BIOGEL PI IND STRL 7.5 (GLOVE) ×1 IMPLANT
GLOVE ECLIPSE 7.5 STRL STRAW (GLOVE) ×1 IMPLANT
GOWN STRL REUS W/TWL LRG LVL3 (GOWN DISPOSABLE) ×2 IMPLANT
HIBICLENS CHG 4% 4OZ BTL (MISCELLANEOUS) ×1 IMPLANT
MAT PREVALON FULL STRYKER (MISCELLANEOUS) IMPLANT
NEEDLE HYPO 22GX1.5 SAFETY (NEEDLE) ×1 IMPLANT
NS IRRIG 1000ML POUR BTL (IV SOLUTION) ×1 IMPLANT
PACK ABDOMINAL MINOR (CUSTOM PROCEDURE TRAY) ×1 IMPLANT
PROTECTOR NERVE ULNAR (MISCELLANEOUS) ×1 IMPLANT
SPONGE LAP 4X18 RFD (DISPOSABLE) IMPLANT
SUT VICRYL 0 UR6 27IN ABS (SUTURE) ×1 IMPLANT
SUT VICRYL 4-0 PS2 18IN ABS (SUTURE) ×1 IMPLANT
SYR CONTROL 10ML LL (SYRINGE) ×1 IMPLANT
TOWEL OR 17X24 6PK STRL BLUE (TOWEL DISPOSABLE) ×2 IMPLANT
TRAY FOLEY W/BAG SLVR 14FR (SET/KITS/TRAYS/PACK) ×1 IMPLANT

## 2024-12-19 NOTE — Progress Notes (Signed)
 Post Partum Day 1 Subjective: no complaints, up ad lib, voiding, and tolerating PO  Objective: Blood pressure 118/73, pulse 80, temperature 97.9 F (36.6 C), temperature source Oral, resp. rate 17, height 5' 4 (1.626 m), weight 110.6 kg, last menstrual period 03/22/2024, SpO2 99%, unknown if currently breastfeeding.  Physical Exam:  General: alert, cooperative, and no distress Lochia: appropriate Uterine Fundus: firm Incision: n/a DVT Evaluation: No evidence of DVT seen on physical exam. Negative Homan's sign. No cords or calf tenderness.  Recent Labs    12/17/24 2136  HGB 10.0*  HCT 30.2*    Assessment/Plan: Plan for discharge tomorrow   Risks of procedure discussed with patient including but not limited to: risk of regret, permanence of method, bleeding, infection, injury to surrounding organs and need for additional procedures.  Failure risk of 1 -2 % with increased risk of ectopic gestation if pregnancy occurs was also discussed with patient.     LOS: 2 days   Anaid Haney J Alyss Granato, DO 12/19/2024, 10:11 AM

## 2024-12-19 NOTE — Op Note (Signed)
 Alicia Adkins 12/19/2024  PREOPERATIVE DIAGNOSIS:  Undesired fertility  POSTOPERATIVE DIAGNOSIS:  Undesired fertility  PROCEDURE:  Postpartum Bilateral Tubal Sterilization using Ligasure   SURGEON:  Dr Lang Peel  ANESTHESIA:  Epidural  COMPLICATIONS:  None immediate.  ESTIMATED BLOOD LOSS:  Less than 20cc.  FLUIDS: 400 mL LR.  URINE OUTPUT:  110 mL of clear urine.  INDICATIONS: 28 y.o. yo H6E6996  with undesired fertility,status post vaginal delivery, desires permanent sterilization. Risks and benefits of procedure discussed with patient including permanence of method, bleeding, infection, injury to surrounding organs and need for additional procedures. Risk failure of 0.5-1% with increased risk of ectopic gestation if pregnancy occurs was also discussed with patient.   FINDINGS:  Normal uterus, tubes, and ovaries.  TECHNIQUE:  The patient was taken to the operating room where her epidural anesthesia was dosed up to surgical level and found to be adequate.  She was then placed in the dorsal supine position and prepped and draped in sterile fashion.  After an adequate timeout was performed, attention was turned to the patient's abdomen where a small transverse skin incision was made under the umbilical fold. The incision was taken down to the layer of fascia using the scalpel, and fascia was incised, and extended bilaterally using Mayo scissors. The peritoneum was entered in a sharp fashion.   Attention was then turned to the patient's adnexa. The left fallopian tube was identified and followed out to the fimbriated end.  Ligasure device was used to cauterize and cut the mesosalpinx to proximal end of the fallopian tube, removing 6cm of tube. A similar process was carried out on the right side allowing for bilateral tubal sterilization.    Good hemostasis was noted overall.  Local analgesia was drizzled on both operative sites.The instruments were then removed from the  patient's abdomen and the fascial incision was repaired with 0 Vicryl, and the skin was closed with a 3-0 Monocryl subcuticular stitch. The patient tolerated the procedure well.  Sponge, lap, and needle counts were correct times two.  The patient was then taken to the recovery room awake, extubated and in stable condition.   Tania Steinhauser J, DO 12/19/2024 2:37 PM

## 2024-12-19 NOTE — Anesthesia Postprocedure Evaluation (Signed)
"   Anesthesia Post Note  Patient: [name]  Procedure(s) Performed: LIGATION, FALLOPIAN TUBE, POSTPARTUM (Bilateral: Abdomen)     Patient location during evaluation: Mother Baby Anesthesia Type: Epidural Level of consciousness: awake and alert Pain management: pain level controlled Vital Signs Assessment: post-procedure vital signs reviewed and stable Respiratory status: spontaneous breathing, nonlabored ventilation and respiratory function stable Cardiovascular status: stable Postop Assessment: no headache, no backache and epidural receding Anesthetic complications: no   No notable events documented.  Last Vitals:  Vitals:   12/19/24 1612 12/19/24 1727  BP: 116/74   Pulse: 83   Resp: 20 20  Temp: 36.6 C 37 C  SpO2: 99%     Last Pain:  Vitals:   12/19/24 1727  TempSrc: Oral  PainSc: 0-No pain   Pain Goal:                   Kamee Bobst      "

## 2024-12-19 NOTE — Anesthesia Preprocedure Evaluation (Signed)
"                                    Anesthesia Evaluation  Patient identified by MRN, date of birth, ID band Patient awake    Reviewed: Allergy & Precautions, NPO status , Patient's Chart, lab work & pertinent test results  Airway Mallampati: II  TM Distance: >3 FB Neck ROM: Full    Dental no notable dental hx.    Pulmonary neg pulmonary ROS   Pulmonary exam normal breath sounds clear to auscultation       Cardiovascular hypertension, Normal cardiovascular exam Rhythm:Regular Rate:Normal     Neuro/Psych  PSYCHIATRIC DISORDERS  Depression    negative neurological ROS     GI/Hepatic negative GI ROS, Neg liver ROS,,,  Endo/Other    Class 3 obesity (BMI 42)  Renal/GU Renal diseasenegative Renal ROS  negative genitourinary   Musculoskeletal negative musculoskeletal ROS (+)    Abdominal   Peds  Hematology negative hematology ROS (+)   Anesthesia Other Findings IOL for cHTN  Reproductive/Obstetrics                              Anesthesia Physical Anesthesia Plan  ASA: 3  Anesthesia Plan: Epidural   Post-op Pain Management:    Induction:   PONV Risk Score and Plan: Treatment may vary due to age or medical condition  Airway Management Planned: Natural Airway  Additional Equipment:   Intra-op Plan:   Post-operative Plan:   Informed Consent: I have reviewed the patients History and Physical, chart, labs and discussed the procedure including the risks, benefits and alternatives for the proposed anesthesia with the patient or authorized representative who has indicated his/her understanding and acceptance.       Plan Discussed with: Anesthesiologist  Anesthesia Plan Comments:          Anesthesia Quick Evaluation  "

## 2024-12-19 NOTE — Anesthesia Procedure Notes (Signed)
 Procedure Name: Intubation Date/Time: 12/19/2024 2:13 PM  Performed by: Claudene Hoy CROME, CRNAPre-anesthesia Checklist: Patient identified, Emergency Drugs available, Suction available and Patient being monitored Patient Re-evaluated:Patient Re-evaluated prior to induction Oxygen Delivery Method: Circle System Utilized Preoxygenation: Pre-oxygenation with 100% oxygen Induction Type: IV induction, Rapid sequence and Cricoid Pressure applied Laryngoscope Size: Glidescope and 3 Grade View: Grade I Tube type: Oral Tube size: 7.0 mm Number of attempts: 1 Airway Equipment and Method: Stylet Placement Confirmation: ETT inserted through vocal cords under direct vision, positive ETCO2 and breath sounds checked- equal and bilateral Secured at: 21 cm Tube secured with: Tape Dental Injury: Teeth and Oropharynx as per pre-operative assessment  Comments: Elective glide

## 2024-12-19 NOTE — Patient Instructions (Signed)

## 2024-12-19 NOTE — Transfer of Care (Signed)
 Immediate Anesthesia Transfer of Care Note  Patient: Alicia Adkins  Procedure(s) Performed: LIGATION, FALLOPIAN TUBE, POSTPARTUM (Bilateral: Abdomen)  Patient Location: PACU  Anesthesia Type:General  Level of Consciousness: drowsy and patient cooperative  Airway & Oxygen Therapy: Patient Spontanous Breathing  Post-op Assessment: Report given to RN and Post -op Vital signs reviewed and stable  Post vital signs: Reviewed and stable  Last Vitals:  Vitals Value Taken Time  BP 111/61 12/19/24 14:47  Temp    Pulse 89 12/19/24 14:48  Resp 26 12/19/24 14:48  SpO2 96 % 12/19/24 14:48  Vitals shown include unfiled device data.  Last Pain:  Vitals:   12/19/24 1255  TempSrc: Oral  PainSc:          Complications: No notable events documented.

## 2024-12-20 ENCOUNTER — Other Ambulatory Visit (HOSPITAL_COMMUNITY): Payer: Self-pay

## 2024-12-20 MED ORDER — IBUPROFEN 600 MG PO TABS: 600.0000 mg | ORAL_TABLET | Freq: Four times a day (QID) | ORAL | 0 refills | Status: DC

## 2024-12-20 MED ORDER — FUROSEMIDE 20 MG PO TABS
20.0000 mg | ORAL_TABLET | Freq: Every day | ORAL | 0 refills | Status: AC
  Filled 2024-12-20 (×2): qty 5, 5d supply, fill #0

## 2024-12-20 MED ORDER — SENNOSIDES-DOCUSATE SODIUM 8.6-50 MG PO TABS: 2.0000 | ORAL_TABLET | ORAL | 0 refills | Status: DC

## 2024-12-20 MED ORDER — SIMETHICONE 80 MG PO CHEW: 80.0000 mg | CHEWABLE_TABLET | ORAL | 0 refills | Status: DC | PRN

## 2024-12-20 MED ORDER — POTASSIUM CHLORIDE CRYS ER 20 MEQ PO TBCR
20.0000 meq | EXTENDED_RELEASE_TABLET | Freq: Every day | ORAL | 0 refills | Status: AC
  Filled 2024-12-20: qty 5, 5d supply, fill #0

## 2024-12-20 MED ORDER — FUROSEMIDE 20 MG PO TABS: 20.0000 mg | ORAL_TABLET | Freq: Every day | ORAL | 0 refills | Status: DC

## 2024-12-20 MED ORDER — ACETAMINOPHEN 325 MG PO TABS: 650.0000 mg | ORAL_TABLET | ORAL | 0 refills | Status: DC | PRN

## 2024-12-20 MED ORDER — ACETAMINOPHEN 325 MG PO TABS
650.0000 mg | ORAL_TABLET | ORAL | 0 refills | Status: AC | PRN
  Filled 2024-12-20: qty 30, 3d supply, fill #0

## 2024-12-20 MED ORDER — IBUPROFEN 600 MG PO TABS
600.0000 mg | ORAL_TABLET | Freq: Four times a day (QID) | ORAL | 0 refills | Status: AC
  Filled 2024-12-20: qty 30, 8d supply, fill #0

## 2024-12-20 MED ORDER — SENNOSIDES-DOCUSATE SODIUM 8.6-50 MG PO TABS
2.0000 | ORAL_TABLET | ORAL | 0 refills | Status: AC
  Filled 2024-12-20: qty 30, 15d supply, fill #0

## 2024-12-20 MED ORDER — SIMETHICONE 80 MG PO CHEW
80.0000 mg | CHEWABLE_TABLET | ORAL | 0 refills | Status: AC | PRN
  Filled 2024-12-20: qty 30, 10d supply, fill #0

## 2024-12-20 MED ORDER — POTASSIUM CHLORIDE CRYS ER 20 MEQ PO TBCR: 20.0000 meq | EXTENDED_RELEASE_TABLET | Freq: Every day | ORAL | 0 refills | Status: DC

## 2024-12-20 NOTE — Progress Notes (Signed)
 This nurse hand delivered TOC meds to pt. Alicia Adkins.

## 2024-12-20 NOTE — Anesthesia Postprocedure Evaluation (Signed)
"   Anesthesia Post Note  Patient: [name]  Procedure(s) Performed: AN AD HOC LABOR EPIDURAL     Patient location during evaluation: Mother Baby Anesthesia Type: Epidural Level of consciousness: awake and alert Pain management: pain level controlled Vital Signs Assessment: post-procedure vital signs reviewed and stable Respiratory status: spontaneous breathing, nonlabored ventilation and respiratory function stable Cardiovascular status: stable Postop Assessment: no headache, no backache and epidural receding Anesthetic complications: no   No notable events documented.  Last Vitals:  Vitals:   12/20/24 0245 12/20/24 0525  BP: 116/67 132/82  Pulse: 70 76  Resp:  20  Temp:  36.5 C  SpO2:  100%    Last Pain:  Vitals:   12/20/24 0525  TempSrc: Oral  PainSc:    Pain Goal:                   CHERILYN SLOUGH      "

## 2024-12-21 ENCOUNTER — Encounter: Admitting: Obstetrics & Gynecology

## 2024-12-21 ENCOUNTER — Other Ambulatory Visit

## 2024-12-21 LAB — SURGICAL PATHOLOGY

## 2024-12-23 ENCOUNTER — Ambulatory Visit: Payer: Self-pay | Admitting: Obstetrics & Gynecology

## 2024-12-26 ENCOUNTER — Other Ambulatory Visit

## 2024-12-27 ENCOUNTER — Encounter: Payer: Self-pay | Admitting: *Deleted

## 2024-12-27 ENCOUNTER — Telehealth

## 2024-12-28 ENCOUNTER — Telehealth (INDEPENDENT_AMBULATORY_CARE_PROVIDER_SITE_OTHER): Admitting: *Deleted

## 2024-12-28 DIAGNOSIS — Z0130 Encounter for examination of blood pressure without abnormal findings: Secondary | ICD-10-CM

## 2024-12-28 NOTE — Progress Notes (Signed)
" ° °  NURSE VISIT- BLOOD PRESSURE CHECK  I connected with Alicia Adkins on 12/28/2024 by MyChart video and verified that I am speaking with the correct person using two identifiers.   I discussed the limitations of evaluation and management by telemedicine. The patient expressed understanding and agreed to proceed.  Nurse is at the office, and patient is at home.  SUBJECTIVE:  Alicia Adkins is a 29 y.o. G75P3003 female here for BP check. She is postpartum, delivery date 12/18/24    HYPERTENSION ROS:  Pregnant/postpartum:  Severe headaches that don't go away with tylenol /other medicines: No  Visual changes (seeing spots/double/blurred vision) No  Severe pain under right breast breast or in center of upper chest No  Severe nausea/vomiting No  Taking medicines as instructed not applicable    OBJECTIVE:  LMP 03/22/2024   Breastfeeding Yes   Appearance alert, well appearing, and in no distress.  ASSESSMENT: Postpartum  blood pressure check  PLAN: Discussed with Luke Gentry, CNM   Recommendations: no changes needed   Follow-up: as scheduled   Alan LITTIE Fischer  12/28/2024 3:06 PM  "

## 2025-01-23 ENCOUNTER — Ambulatory Visit: Admitting: Obstetrics & Gynecology

## 2025-01-25 ENCOUNTER — Ambulatory Visit: Admitting: Advanced Practice Midwife

## 2025-02-06 ENCOUNTER — Ambulatory Visit: Admitting: Advanced Practice Midwife
